# Patient Record
Sex: Female | Born: 1985 | Race: White | Hispanic: No | Marital: Married | Smoking: Former smoker
Health system: Southern US, Community
[De-identification: ages and names within clinical notes are randomized; demographics above are authoritative.]

## PROBLEM LIST (undated history)

## (undated) DIAGNOSIS — R519 Headache, unspecified: Secondary | ICD-10-CM

## (undated) DIAGNOSIS — K760 Fatty (change of) liver, not elsewhere classified: Secondary | ICD-10-CM

## (undated) DIAGNOSIS — N390 Urinary tract infection, site not specified: Secondary | ICD-10-CM

## (undated) DIAGNOSIS — Z765 Malingerer [conscious simulation]: Secondary | ICD-10-CM

## (undated) DIAGNOSIS — I10 Essential (primary) hypertension: Secondary | ICD-10-CM

## (undated) DIAGNOSIS — G43909 Migraine, unspecified, not intractable, without status migrainosus: Secondary | ICD-10-CM

## (undated) DIAGNOSIS — F32A Depression, unspecified: Secondary | ICD-10-CM

## (undated) DIAGNOSIS — G969 Disorder of central nervous system, unspecified: Secondary | ICD-10-CM

## (undated) DIAGNOSIS — Z87442 Personal history of urinary calculi: Secondary | ICD-10-CM

## (undated) DIAGNOSIS — J40 Bronchitis, not specified as acute or chronic: Secondary | ICD-10-CM

## (undated) DIAGNOSIS — F329 Major depressive disorder, single episode, unspecified: Secondary | ICD-10-CM

## (undated) DIAGNOSIS — F332 Major depressive disorder, recurrent severe without psychotic features: Secondary | ICD-10-CM

## (undated) DIAGNOSIS — J45909 Unspecified asthma, uncomplicated: Secondary | ICD-10-CM

## (undated) DIAGNOSIS — F419 Anxiety disorder, unspecified: Secondary | ICD-10-CM

## (undated) DIAGNOSIS — N200 Calculus of kidney: Secondary | ICD-10-CM

## (undated) DIAGNOSIS — F909 Attention-deficit hyperactivity disorder, unspecified type: Secondary | ICD-10-CM

## (undated) DIAGNOSIS — K589 Irritable bowel syndrome without diarrhea: Secondary | ICD-10-CM

## (undated) DIAGNOSIS — A630 Anogenital (venereal) warts: Secondary | ICD-10-CM

## (undated) DIAGNOSIS — I8290 Acute embolism and thrombosis of unspecified vein: Secondary | ICD-10-CM

## (undated) DIAGNOSIS — R51 Headache: Secondary | ICD-10-CM

## (undated) DIAGNOSIS — K8020 Calculus of gallbladder without cholecystitis without obstruction: Secondary | ICD-10-CM

## (undated) HISTORY — DX: Headache, unspecified: R51.9

## (undated) HISTORY — PX: CERVICAL BIOPSY  W/ LOOP ELECTRODE EXCISION: SUR135

## (undated) HISTORY — DX: Headache: R51

## (undated) HISTORY — PX: NO PAST SURGERIES: SHX2092

## (undated) HISTORY — DX: Urinary tract infection, site not specified: N39.0

## (undated) HISTORY — DX: Depression, unspecified: F32.A

## (undated) HISTORY — DX: Fatty (change of) liver, not elsewhere classified: K76.0

## (undated) HISTORY — DX: Irritable bowel syndrome, unspecified: K58.9

## (undated) HISTORY — DX: Major depressive disorder, single episode, unspecified: F32.9

## (undated) HISTORY — DX: Unspecified asthma, uncomplicated: J45.909

---
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
## 2011-02-18 ENCOUNTER — Emergency Department (HOSPITAL_COMMUNITY)
Admission: EM | Admit: 2011-02-18 | Discharge: 2011-02-18 | Disposition: A | Discharge status: Home / Self Care | Payer: Self-pay | Attending: Emergency Medicine | Admitting: Emergency Medicine

## 2011-02-18 DIAGNOSIS — M545 Low back pain, unspecified: Secondary | ICD-10-CM | POA: Insufficient documentation

## 2011-02-18 DIAGNOSIS — IMO0001 Reserved for inherently not codable concepts without codable children: Secondary | ICD-10-CM | POA: Insufficient documentation

## 2011-02-18 DIAGNOSIS — R300 Dysuria: Secondary | ICD-10-CM | POA: Insufficient documentation

## 2011-02-18 DIAGNOSIS — N390 Urinary tract infection, site not specified: Secondary | ICD-10-CM | POA: Insufficient documentation

## 2011-02-18 DIAGNOSIS — F341 Dysthymic disorder: Secondary | ICD-10-CM | POA: Insufficient documentation

## 2011-02-18 DIAGNOSIS — Z79899 Other long term (current) drug therapy: Secondary | ICD-10-CM | POA: Insufficient documentation

## 2011-02-18 LAB — POCT I-STAT, CHEM 8
Creatinine, Ser: 0.8 mg/dL (ref 0.50–1.10)
Glucose, Bld: 86 mg/dL (ref 70–99)
Hemoglobin: 15.3 g/dL — ABNORMAL HIGH (ref 12.0–15.0)
TCO2: 26 mmol/L (ref 0–100)

## 2011-02-18 LAB — URINALYSIS, ROUTINE W REFLEX MICROSCOPIC
Glucose, UA: NEGATIVE mg/dL
Protein, ur: NEGATIVE mg/dL
Specific Gravity, Urine: 1.016 (ref 1.005–1.030)
Urobilinogen, UA: 0.2 mg/dL (ref 0.0–1.0)

## 2011-02-18 LAB — URINE MICROSCOPIC-ADD ON

## 2011-05-08 ENCOUNTER — Emergency Department (HOSPITAL_COMMUNITY): Payer: Self-pay

## 2011-05-08 ENCOUNTER — Encounter: Payer: Self-pay | Admitting: *Deleted

## 2011-05-08 ENCOUNTER — Emergency Department (HOSPITAL_COMMUNITY)
Admission: EM | Admit: 2011-05-08 | Discharge: 2011-05-08 | Disposition: A | Discharge status: Home / Self Care | Payer: Self-pay | Attending: Emergency Medicine | Admitting: Emergency Medicine

## 2011-05-08 DIAGNOSIS — N134 Hydroureter: Secondary | ICD-10-CM

## 2011-05-08 DIAGNOSIS — R112 Nausea with vomiting, unspecified: Secondary | ICD-10-CM | POA: Insufficient documentation

## 2011-05-08 DIAGNOSIS — R109 Unspecified abdominal pain: Secondary | ICD-10-CM | POA: Insufficient documentation

## 2011-05-08 DIAGNOSIS — K7689 Other specified diseases of liver: Secondary | ICD-10-CM | POA: Insufficient documentation

## 2011-05-08 DIAGNOSIS — Z87442 Personal history of urinary calculi: Secondary | ICD-10-CM | POA: Insufficient documentation

## 2011-05-08 DIAGNOSIS — N1330 Unspecified hydronephrosis: Secondary | ICD-10-CM | POA: Insufficient documentation

## 2011-05-08 DIAGNOSIS — M545 Low back pain, unspecified: Secondary | ICD-10-CM | POA: Insufficient documentation

## 2011-05-08 HISTORY — DX: Calculus of kidney: N20.0

## 2011-05-08 HISTORY — DX: Bronchitis, not specified as acute or chronic: J40

## 2011-05-08 LAB — CBC
HCT: 43.3 % (ref 36.0–46.0)
MCH: 30 pg (ref 26.0–34.0)
MCHC: 34.9 g/dL (ref 30.0–36.0)
MCV: 85.9 fL (ref 78.0–100.0)
RDW: 12 % (ref 11.5–15.5)

## 2011-05-08 LAB — COMPREHENSIVE METABOLIC PANEL
AST: 56 U/L — ABNORMAL HIGH (ref 0–37)
CO2: 22 mEq/L (ref 19–32)
Calcium: 9.4 mg/dL (ref 8.4–10.5)
Creatinine, Ser: 0.8 mg/dL (ref 0.50–1.10)
GFR calc non Af Amer: >90 mL/min (ref >90)

## 2011-05-08 LAB — DIFFERENTIAL
Basophils Absolute: 0 10*3/uL (ref 0.0–0.1)
Basophils Relative: 0 % (ref 0–1)
Eosinophils Relative: 2 % (ref 0–5)
Lymphocytes Relative: 38 % (ref 12–46)
Monocytes Absolute: 1 10*3/uL (ref 0.1–1.0)

## 2011-05-08 LAB — URINE MICROSCOPIC-ADD ON

## 2011-05-08 LAB — URINALYSIS, ROUTINE W REFLEX MICROSCOPIC
Bilirubin Urine: NEGATIVE
Glucose, UA: NEGATIVE mg/dL
Ketones, ur: NEGATIVE mg/dL
Protein, ur: NEGATIVE mg/dL

## 2011-05-08 LAB — WET PREP, GENITAL: Yeast Wet Prep HPF POC: NONE SEEN

## 2011-05-08 MED ORDER — FENTANYL CITRATE 0.05 MG/ML IJ SOLN: 50.0000 ug | Freq: Once | INTRAMUSCULAR | Status: DC

## 2011-05-08 MED ORDER — HYDROCODONE-ACETAMINOPHEN 5-325 MG PO TABS: 1.0000 | ORAL_TABLET | Freq: Four times a day (QID) | ORAL | Status: AC | PRN

## 2011-05-08 MED ORDER — ONDANSETRON HCL 4 MG/2ML IJ SOLN
4.0000 mg | Freq: Once | INTRAMUSCULAR | Status: AC
  Administered 2011-05-08: 4 mg via INTRAVENOUS
  Filled 2011-05-08: qty 2

## 2011-05-08 MED ORDER — MORPHINE SULFATE 4 MG/ML IJ SOLN
4.0000 mg | Freq: Once | INTRAMUSCULAR | Status: AC
  Administered 2011-05-08: 4 mg via INTRAVENOUS
  Filled 2011-05-08: qty 1

## 2011-05-08 MED ORDER — NAPROXEN 500 MG PO TABS: 500.0000 mg | ORAL_TABLET | Freq: Two times a day (BID) | ORAL | Status: DC

## 2011-05-08 MED ORDER — KETOROLAC TROMETHAMINE 30 MG/ML IJ SOLN
30.0000 mg | Freq: Once | INTRAMUSCULAR | Status: AC
  Administered 2011-05-08: 30 mg via INTRAVENOUS
  Filled 2011-05-08: qty 1

## 2011-05-08 NOTE — ED Notes (Signed)
Per EMS, pt from home, reports pain which started in her L groin area that radiates up to her LLQ and around to her L flank area.  Also reports nausea and dry heaving.  Pt has hx of kidney stones.  Pt is taking prednisone for bronchitis.

## 2011-05-08 NOTE — ED Notes (Signed)
Patient transported to CT 

## 2011-05-08 NOTE — ED Notes (Signed)
Pt ambulatory to restroom with minimal assistance   

## 2011-05-08 NOTE — ED Provider Notes (Signed)
History     CSN: [id number]  Arrival date & time 05/08/11  1432   First MD Initiated Contact with Patient 05/08/11 1448     6:37 PM HPI Carol [name] is a 26 y.o. female complaining of left flank pain. States initially pain was sharp vaginal pain that shot up the left lower abdomen. Reports pain in now mainly in her left lower back and flank. Denies vaginal bleeding, discharge, fever, or midline back pain. Denies known injury of h/o abdominal surgeries. Patient is a 26 y.o. female presenting with flank pain. The history is provided by the patient.  Flank Pain This is a new problem. The problem occurs constantly. The problem has been gradually worsening. Associated symptoms include abdominal pain, nausea, urinary symptoms and vomiting. Pertinent negatives include no chest pain, chills, fever, numbness or weakness. The symptoms are aggravated by nothing.    Past Medical History  Diagnosis Date  . Kidney stones   . Bronchitis     History reviewed. No pertinent past surgical history.  No family history on file.  History  Substance Use Topics  . Smoking status: Not on file  . Smokeless tobacco: Not on file  . Alcohol Use:     OB History    Grav Para Term Preterm Abortions TAB SAB Ect Mult Living                  Review of Systems  Constitutional: Negative for fever and chills.  Respiratory: Negative for shortness of breath.   Cardiovascular: Negative for chest pain.  Gastrointestinal: Positive for nausea, vomiting and abdominal pain. Negative for diarrhea and constipation.  Genitourinary: Positive for flank pain and vaginal pain. Negative for dysuria, urgency, frequency, hematuria, vaginal bleeding and vaginal discharge.  Musculoskeletal: Negative for back pain.  Neurological: Negative for weakness and numbness.  All other systems reviewed and are negative.    Allergies  Sulfa antibiotics; Ciprocin-fluocin-procin; and Penicillins  Home Medications   Current  Outpatient Rx  Name Route Sig Dispense Refill  . ALBUTEROL SULFATE HFA 108 (90 BASE) MCG/ACT IN AERS Inhalation Inhale 2 puffs into the lungs every 6 (six) hours as needed. For shortness of breath     . ALPRAZOLAM 1 MG PO TABS Oral Take 1 mg by mouth 3 (three) times daily as needed. For anxiety      . PSEUDOEPHEDRINE-GUAIFENESIN ER 60-600 MG PO TB12 Oral Take 2 tablets by mouth every 12 (twelve) hours.        BP 121/80  Pulse 69  Temp(Src) 97.8 F (36.6 C) (Oral)  Resp 16  SpO2 99%  Physical Exam  Vitals reviewed. Constitutional: She is oriented to person, place, and time. Vital signs are normal. She appears well-developed and well-nourished.  HENT:  Head: Normocephalic and atraumatic.  Eyes: Conjunctivae are normal. Pupils are equal, round, and reactive to light.  Neck: Normal range of motion. Neck supple.  Cardiovascular: Normal rate, regular rhythm and normal heart sounds.  Exam reveals no friction rub.   No murmur heard. Pulmonary/Chest: Effort normal and breath sounds normal. She has no wheezes. She has no rhonchi. She has no rales. She exhibits no tenderness.  Abdominal: Soft. Normal appearance and bowel sounds are normal. There is hepatomegaly. There is no hepatosplenomegaly. There is tenderness in the suprapubic area and left lower quadrant. There is CVA tenderness (left sided). There is no rebound, no tenderness at McBurney's point and negative Murphy's sign.  Musculoskeletal: Normal range of motion.  Neurological: She is alert  and oriented to person, place, and time. Coordination normal.  Skin: Skin is warm and dry. No rash noted. No erythema. No pallor.    ED Course  Procedures (including critical care time)  Labs Reviewed  URINALYSIS, ROUTINE W REFLEX MICROSCOPIC - Abnormal; Notable for the following:    APPearance CLOUDY (*)    Hgb urine dipstick SMALL (*)    Leukocytes, UA MODERATE (*)    All other components within normal limits  CBC - Abnormal; Notable for the  following:    Hemoglobin 15.1 (*)    All other components within normal limits  COMPREHENSIVE METABOLIC PANEL - Abnormal; Notable for the following:    Glucose, Bld 106 (*)    AST 56 (*) HEMOLYSIS AT THIS LEVEL MAY AFFECT RESULT   ALT 76 (*)    All other components within normal limits  URINE MICROSCOPIC-ADD ON - Abnormal; Notable for the following:    Squamous Epithelial / LPF MANY (*)    Bacteria, UA FEW (*)    All other components within normal limits  WET PREP, GENITAL - Abnormal; Notable for the following:    Clue Cells, Wet Prep FEW (*)    WBC, Wet Prep HPF POC MANY (*)    All other components within normal limits  DIFFERENTIAL  LIPASE, BLOOD  POCT PREGNANCY, URINE  GC/CHLAMYDIA PROBE AMP, GENITAL   Ct Abdomen Pelvis Wo Contrast  05/08/2011  *RADIOLOGY REPORT*  Clinical Data: Left flank pain.  CT ABDOMEN AND PELVIS WITHOUT CONTRAST  Technique:  Multidetector CT imaging of the abdomen and pelvis was performed following the standard protocol without intravenous contrast.  Comparison: None  Findings: Lung bases are clear.  No effusions.  Heart is normal size.  There is diffuse fatty infiltration of the liver.  No focal abnormality in the liver.  Gallbladder, spleen, pancreas, adrenals and right kidney have an unremarkable unenhanced appearance.  There is mild left hydronephrosis. No renal or ureteral stones.  No stones within the bladder.  Question recent passage of stone.  Uterus and adnexa are unremarkable. Bowel grossly unremarkable.  No free fluid, free air, or adenopathy.  Appendix is visualized and is normal.  Aorta is normal caliber.  No acute bony abnormality.  IMPRESSION: Mild left hydronephrosis and hydroureter without visible stone. Question recent passage of stone.  Fatty infiltration of the liver.  Original Report Authenticated By: Cyndie Chime, M.D.     No diagnosis found.    MDM   Patient passed Stone in Ed and pain significantly improved.Will d/c home with  analgesics . Pt agrees with plan and is ready for d/c     Thomasene Lot, PA-C 05/08/11 2302

## 2011-05-08 NOTE — ED Notes (Signed)
Brigette, PA at bedside.

## 2011-05-09 LAB — GC/CHLAMYDIA PROBE AMP, GENITAL
Chlamydia, DNA Probe: NEGATIVE
GC Probe Amp, Genital: NEGATIVE

## 2011-05-09 NOTE — ED Provider Notes (Signed)
Medical screening examination/treatment/procedure(s) were performed by non-physician practitioner and as supervising physician I was immediately available for consultation/collaboration.  Ameenah Prosser, MD 05/09/11 1241 

## 2011-10-23 ENCOUNTER — Emergency Department (HOSPITAL_BASED_OUTPATIENT_CLINIC_OR_DEPARTMENT_OTHER)
Admission: EM | Admit: 2011-10-23 | Discharge: 2011-10-23 | Disposition: A | Discharge status: Home / Self Care | Payer: PRIVATE HEALTH INSURANCE | Attending: Emergency Medicine | Admitting: Emergency Medicine

## 2011-10-23 ENCOUNTER — Encounter (HOSPITAL_BASED_OUTPATIENT_CLINIC_OR_DEPARTMENT_OTHER): Payer: Self-pay | Admitting: *Deleted

## 2011-10-23 ENCOUNTER — Emergency Department (HOSPITAL_BASED_OUTPATIENT_CLINIC_OR_DEPARTMENT_OTHER): Payer: PRIVATE HEALTH INSURANCE

## 2011-10-23 DIAGNOSIS — F411 Generalized anxiety disorder: Secondary | ICD-10-CM | POA: Insufficient documentation

## 2011-10-23 DIAGNOSIS — R404 Transient alteration of awareness: Secondary | ICD-10-CM | POA: Insufficient documentation

## 2011-10-23 DIAGNOSIS — T148XXA Other injury of unspecified body region, initial encounter: Secondary | ICD-10-CM

## 2011-10-23 DIAGNOSIS — S8263XA Displaced fracture of lateral malleolus of unspecified fibula, initial encounter for closed fracture: Secondary | ICD-10-CM | POA: Insufficient documentation

## 2011-10-23 DIAGNOSIS — Z79899 Other long term (current) drug therapy: Secondary | ICD-10-CM | POA: Insufficient documentation

## 2011-10-23 DIAGNOSIS — M25579 Pain in unspecified ankle and joints of unspecified foot: Secondary | ICD-10-CM | POA: Insufficient documentation

## 2011-10-23 DIAGNOSIS — M545 Low back pain, unspecified: Secondary | ICD-10-CM | POA: Insufficient documentation

## 2011-10-23 DIAGNOSIS — Z87442 Personal history of urinary calculi: Secondary | ICD-10-CM | POA: Insufficient documentation

## 2011-10-23 DIAGNOSIS — I10 Essential (primary) hypertension: Secondary | ICD-10-CM | POA: Insufficient documentation

## 2011-10-23 DIAGNOSIS — M25519 Pain in unspecified shoulder: Secondary | ICD-10-CM | POA: Insufficient documentation

## 2011-10-23 DIAGNOSIS — M79609 Pain in unspecified limb: Secondary | ICD-10-CM | POA: Insufficient documentation

## 2011-10-23 DIAGNOSIS — S82899A Other fracture of unspecified lower leg, initial encounter for closed fracture: Secondary | ICD-10-CM

## 2011-10-23 HISTORY — DX: Anxiety disorder, unspecified: F41.9

## 2011-10-23 HISTORY — DX: Essential (primary) hypertension: I10

## 2011-10-23 MED ORDER — OXYCODONE-ACETAMINOPHEN 5-325 MG PO TABS
1.0000 | ORAL_TABLET | Freq: Once | ORAL | Status: AC
  Administered 2011-10-23: 1 via ORAL
  Filled 2011-10-23: qty 1

## 2011-10-23 MED ORDER — OXYCODONE-ACETAMINOPHEN 5-325 MG PO TABS: 1.0000 | ORAL_TABLET | Freq: Four times a day (QID) | ORAL | Status: AC | PRN

## 2011-10-23 MED ORDER — CYCLOBENZAPRINE HCL 10 MG PO TABS: 10.0000 mg | ORAL_TABLET | Freq: Two times a day (BID) | ORAL | Status: AC | PRN

## 2011-10-23 NOTE — Discharge Instructions (Signed)
Contusion A contusion is a deep bruise. Contusions are the result of an injury that caused bleeding under the skin. The contusion may turn blue, purple, or yellow. Minor injuries will give you a painless contusion, but more severe contusions may stay painful and swollen for a few weeks.  CAUSES  A contusion is usually caused by a blow, trauma, or direct force to an area of the body. SYMPTOMS   Swelling and redness of the injured area.   Bruising of the injured area.   Tenderness and soreness of the injured area.   Pain.  DIAGNOSIS  The diagnosis can be made by taking a history and physical exam. An X-ray, CT scan, or MRI may be needed to determine if there were any associated injuries, such as fractures. TREATMENT  Specific treatment will depend on what area of the body was injured. In general, the best treatment for a contusion is resting, icing, elevating, and applying cold compresses to the injured area. Over-the-counter medicines may also be recommended for pain control. Ask your caregiver what the best treatment is for your contusion. HOME CARE INSTRUCTIONS   Put ice on the injured area.   Put ice in a plastic bag.   Place a towel between your skin and the bag.   Leave the ice on for 15 to 20 minutes, 3 to 4 times a day.   Only take over-the-counter or prescription medicines for pain, discomfort, or fever as directed by your caregiver. Your caregiver may recommend avoiding anti-inflammatory medicines (aspirin, ibuprofen, and naproxen) for 48 hours because these medicines may increase bruising.   Rest the injured area.   If possible, elevate the injured area to reduce swelling.  SEEK IMMEDIATE MEDICAL CARE IF:   You have increased bruising or swelling.   You have pain that is getting worse.   Your swelling or pain is not relieved with medicines.  MAKE SURE YOU:   Understand these instructions.   Will watch your condition.   Will get help right away if you are not  doing well or get worse.  Document Released: 01/24/2005 Document Revised: 04/05/2011 Document Reviewed: 02/19/2011 [facility] Patient Information 2012 [place], [place].Ankle Fracture A fracture is a break in the bone. Most of the time, surgery is not needed for a broken ankle. A cast, splint, ankle brace, or walking boot may be used to heal the break. A broken ankle can heal in 6 to 12 weeks. HOME CARE  Do not walk on your broken ankle until told by your doctor.   Use crutches as told by your doctor.   Keep your ankle raised (elevated) to the level of the chest as much as possible.   Take medicine as told by your doctor.   Do not smoke.   Eat healthy foods.   Get follow-up care as told by your doctor.   If you do not need a cast, splint, brace, or boot:   Apply an ice pack to the ankle as told by your doctor.   If you have an ankle brace or walking boot:   Only remove it as told by your doctor.   Apply an ice pack to the ankle as told by your doctor.   If you have a splint or cast:   Rest your plaster splint or cast only on a pillow until it is fully hardened. It may take 3 days for the plaster to harden.   Do not scratch under the splint or cast.   Do not  stick anything inside the splint or cast to scratch your skin.   Keep sand and dirt out of the inside of the splint or cast.   Do not pull out the padding from the splint or cast.   Keep your splint or cast dry and clean. Cover it with a plastic bag during showers.   Check the skin around the splint or cast every day. You may put lotion on sore areas.   Do not put pressure on any part of your cast or splint. It may break.  GET HELP RIGHT AWAY IF:   You have severe pain, burning or stinging.   Your toes turn blue or gray.   Your toes feel cold or numb.   You cannot move your toes.   Your splint or cast is too tight.   Your splint or cast breaks.   There is a bad smell or yellowish white fluid (pus) coming  from under the splint or cast.  MAKE SURE YOU:   Understand these instructions.   Will watch your condition.   Will get help right away if you are not doing well or get worse.  Document Released: 02/11/2009 Document Revised: 04/05/2011 Document Reviewed: 02/16/2009 [facility] Patient Information 2012 [place], [place].Ankle Fracture A fracture is a break in the bone. Most of the time, surgery is not needed for a broken ankle. A cast, splint, ankle brace, or walking boot may be used to heal the break. A broken ankle can heal in 6 to 12 weeks. HOME CARE  Do not walk on your broken ankle until told by your doctor.   Use crutches as told by your doctor.   Keep your ankle raised (elevated) to the level of the chest as much as possible.   Take medicine as told by your doctor.   Do not smoke.   Eat healthy foods.   Get follow-up care as told by your doctor.   If you do not need a cast, splint, brace, or boot:   Apply an ice pack to the ankle as told by your doctor.   If you have an ankle brace or walking boot:   Only remove it as told by your doctor.   Apply an ice pack to the ankle as told by your doctor.   If you have a splint or cast:   Rest your plaster splint or cast only on a pillow until it is fully hardened. It may take 3 days for the plaster to harden.   Do not scratch under the splint or cast.   Do not stick anything inside the splint or cast to scratch your skin.   Keep sand and dirt out of the inside of the splint or cast.   Do not pull out the padding from the splint or cast.   Keep your splint or cast dry and clean. Cover it with a plastic bag during showers.   Check the skin around the splint or cast every day. You may put lotion on sore areas.   Do not put pressure on any part of your cast or splint. It may break.  GET HELP RIGHT AWAY IF:   You have severe pain, burning or stinging.   Your toes turn blue or gray.   Your toes feel cold or numb.   You  cannot move your toes.   Your splint or cast is too tight.   Your splint or cast breaks.   There is a bad smell or yellowish white  fluid (pus) coming from under the splint or cast.  MAKE SURE YOU:   Understand these instructions.   Will watch your condition.   Will get help right away if you are not doing well or get worse.  Document Released: 02/11/2009 Document Revised: 04/05/2011 Document Reviewed: 02/16/2009 [facility] Patient Information 2012 [place], [place].
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
## 2011-12-06 ENCOUNTER — Ambulatory Visit (HOSPITAL_COMMUNITY): Payer: PRIVATE HEALTH INSURANCE | Admitting: Psychology

## 2012-05-08 ENCOUNTER — Ambulatory Visit (INDEPENDENT_AMBULATORY_CARE_PROVIDER_SITE_OTHER): Payer: PRIVATE HEALTH INSURANCE | Admitting: Psychiatry

## 2012-05-08 ENCOUNTER — Encounter (HOSPITAL_COMMUNITY): Payer: Self-pay | Admitting: Psychiatry

## 2012-05-08 VITALS — Wt 257.2 lb

## 2012-05-08 DIAGNOSIS — F5105 Insomnia due to other mental disorder: Secondary | ICD-10-CM

## 2012-05-08 DIAGNOSIS — F909 Attention-deficit hyperactivity disorder, unspecified type: Secondary | ICD-10-CM

## 2012-05-08 DIAGNOSIS — F418 Other specified anxiety disorders: Secondary | ICD-10-CM | POA: Insufficient documentation

## 2012-05-08 DIAGNOSIS — F603 Borderline personality disorder: Secondary | ICD-10-CM | POA: Insufficient documentation

## 2012-05-08 MED ORDER — ALPRAZOLAM 1 MG PO TABS: 1.0000 mg | ORAL_TABLET | Freq: Three times a day (TID) | ORAL | Status: DC | PRN

## 2012-05-08 MED ORDER — MIRTAZAPINE 15 MG PO TABS: 15.0000 mg | ORAL_TABLET | Freq: Every day | ORAL | Status: DC

## 2012-05-08 NOTE — Patient Instructions (Addendum)
Look up low dose Risperdal on the Internet  DBT at [facility].  Outpatient group $25 per session and 12 weekly session.  CUT BACK/CUT OUT on sugar and carbohydrates, that means very limited fruits and starchy vegetables and very limited grains, breads  The goal is low GLYCEMIC INDEX.  CUT OUT all wheat, rye, or barley for the gluten in them.  HIGH fat and LOW carbohydrate diet is the KEY.  Eat avocados, eggs, lean meat like grass fed beef and chicken  Nuts and seeds would be good foods as well.   Stevia is an excellent sweetener.  Safe for the brain.   Almond butter is awesome.  Tilden Fossa is an excellent resource for weight reduction.  She is a Publishing rights manager who has practiced for 15 years in the field of weight management.  Her phone number is [phone].
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
## 2012-06-03 ENCOUNTER — Ambulatory Visit (HOSPITAL_COMMUNITY): Payer: PRIVATE HEALTH INSURANCE | Admitting: Psychiatry

## 2012-06-06 ENCOUNTER — Ambulatory Visit (HOSPITAL_COMMUNITY): Payer: PRIVATE HEALTH INSURANCE | Admitting: Psychiatry

## 2012-06-13 ENCOUNTER — Ambulatory Visit (HOSPITAL_COMMUNITY): Payer: PRIVATE HEALTH INSURANCE | Admitting: Psychiatry

## 2012-07-30 ENCOUNTER — Ambulatory Visit (INDEPENDENT_AMBULATORY_CARE_PROVIDER_SITE_OTHER): Payer: Self-pay | Admitting: Psychiatry

## 2012-07-30 ENCOUNTER — Ambulatory Visit (HOSPITAL_COMMUNITY): Payer: Self-pay | Admitting: Psychiatry

## 2012-07-30 ENCOUNTER — Encounter (HOSPITAL_COMMUNITY): Payer: Self-pay | Admitting: Psychiatry

## 2012-07-30 VITALS — Wt 261.0 lb

## 2012-07-30 DIAGNOSIS — F603 Borderline personality disorder: Secondary | ICD-10-CM

## 2012-07-30 DIAGNOSIS — F909 Attention-deficit hyperactivity disorder, unspecified type: Secondary | ICD-10-CM

## 2012-07-30 DIAGNOSIS — G969 Disorder of central nervous system, unspecified: Secondary | ICD-10-CM | POA: Insufficient documentation

## 2012-07-30 DIAGNOSIS — F418 Other specified anxiety disorders: Secondary | ICD-10-CM

## 2012-07-30 MED ORDER — MIRTAZAPINE 15 MG PO TABS: 15.0000 mg | ORAL_TABLET | Freq: Every day | ORAL | Status: DC

## 2012-07-30 MED ORDER — CARBAMAZEPINE ER 200 MG PO TB12: ORAL_TABLET | ORAL | Status: DC

## 2012-07-30 MED ORDER — ALPRAZOLAM 1 MG PO TABS: 1.0000 mg | ORAL_TABLET | Freq: Three times a day (TID) | ORAL | Status: DC | PRN
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
## 2012-07-30 NOTE — Patient Instructions (Addendum)
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  Look these up on the Internet.  For what is believed to be chronic tramatic encephalopathy, it advised that you get  regular exercise, regular sleep, and  consume good quality, fish oil, 1000 mg twice a day. These 3 things are the foundation of rehabilitating your brain. Staying off all abusable substances including nicotine, caffeine, and refined sugar and avoiding further head injuries are the other important elements in helping you keep your brain working the best it can for you. If memory is a problem then INSTEAD of the fish oil mentioned above, try using Brain Power Basics from [employer].  You can order online or by phone [phone]. It costs $99 for the first month, and $80 monthly thereafter, but that investment in your brain and the recovery of your brain proper functioning would seem worth it.  Relaxation is the ultimate solution for you.  You can seek it through tub baths, bubble baths, essential oils or incense, walking or chatting with friends, listening to soft music, watching a candle burn and just letting all thoughts go and appreciating the true essence of the Creator.  Pets or animals may be very helpful.  You might spend some time with them and then go do more directed meditation.  Yoga is a very helpful exercise method.  On TV, on line, or by DVD Adelfa Koh is a source of high quality information about yoga and videos on yoga.  Renee Ramus is the world's number one video yoga instructor according to some experts.  There are exceptional health benefits that can be achieved through yoga.  The main principles of yoga is acceptance, no competition, no comparison, and no judgement.  It is exceptional in helping people meditate and get to a very relaxed state.   CUT BACK/CUT OUT on sugar and carbohydrates, that means very limited fruits and starchy vegetables and very limited grains,  breads  The goal is low GLYCEMIC INDEX.  CUT OUT all wheat, rye, or barley for the GLUTEN in them.  HIGH fat and LOW carbohydrate diet is the KEY.  Eat avocados, eggs, lean meat like grass fed beef and chicken  Nuts and seeds would be good foods as well.   Stevia is an excellent sweetener.  Safe for the brain.   Lowella Grip is also a good safe sweetener, not the baking blend form of Truvia  Almond butter is awesome.  Check out all this on the Internet.  Dr [place]ber [place] is on the Internet with some good info about this.   http://www.drperlmutter.com is where that is.  An excellent site for info on this diet is http://paleoleap.com  [employer] makes dark chocolate that is sweetened with Stevia that is safe.  Call if problems or concerns.

## 2013-01-26 ENCOUNTER — Ambulatory Visit (HOSPITAL_COMMUNITY): Payer: Self-pay | Admitting: Psychiatry

## 2013-01-30 ENCOUNTER — Ambulatory Visit (INDEPENDENT_AMBULATORY_CARE_PROVIDER_SITE_OTHER): Payer: PRIVATE HEALTH INSURANCE | Admitting: Psychiatry

## 2013-01-30 ENCOUNTER — Ambulatory Visit (HOSPITAL_COMMUNITY): Payer: Self-pay | Admitting: Psychiatry

## 2013-01-30 ENCOUNTER — Encounter (HOSPITAL_COMMUNITY): Payer: Self-pay | Admitting: Psychiatry

## 2013-01-30 VITALS — BP 130/94 | Ht 66.0 in | Wt 262.0 lb

## 2013-01-30 DIAGNOSIS — F411 Generalized anxiety disorder: Secondary | ICD-10-CM

## 2013-01-30 DIAGNOSIS — F909 Attention-deficit hyperactivity disorder, unspecified type: Secondary | ICD-10-CM

## 2013-01-30 DIAGNOSIS — F418 Other specified anxiety disorders: Secondary | ICD-10-CM

## 2013-01-30 MED ORDER — ALPRAZOLAM 1 MG PO TABS: 1.0000 mg | ORAL_TABLET | Freq: Three times a day (TID) | ORAL | Status: DC | PRN
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
## 2013-03-26 ENCOUNTER — Encounter (HOSPITAL_BASED_OUTPATIENT_CLINIC_OR_DEPARTMENT_OTHER): Payer: Self-pay | Admitting: Emergency Medicine

## 2013-03-26 ENCOUNTER — Emergency Department (HOSPITAL_BASED_OUTPATIENT_CLINIC_OR_DEPARTMENT_OTHER)
Admission: EM | Admit: 2013-03-26 | Discharge: 2013-03-26 | Disposition: A | Discharge status: Home / Self Care | Payer: PRIVATE HEALTH INSURANCE | Attending: Emergency Medicine | Admitting: Emergency Medicine

## 2013-03-26 DIAGNOSIS — IMO0002 Reserved for concepts with insufficient information to code with codable children: Secondary | ICD-10-CM | POA: Insufficient documentation

## 2013-03-26 DIAGNOSIS — J45909 Unspecified asthma, uncomplicated: Secondary | ICD-10-CM | POA: Insufficient documentation

## 2013-03-26 DIAGNOSIS — F3289 Other specified depressive episodes: Secondary | ICD-10-CM | POA: Insufficient documentation

## 2013-03-26 DIAGNOSIS — Z8709 Personal history of other diseases of the respiratory system: Secondary | ICD-10-CM | POA: Insufficient documentation

## 2013-03-26 DIAGNOSIS — Z87442 Personal history of urinary calculi: Secondary | ICD-10-CM | POA: Insufficient documentation

## 2013-03-26 DIAGNOSIS — F329 Major depressive disorder, single episode, unspecified: Secondary | ICD-10-CM | POA: Insufficient documentation

## 2013-03-26 DIAGNOSIS — Z8719 Personal history of other diseases of the digestive system: Secondary | ICD-10-CM | POA: Insufficient documentation

## 2013-03-26 DIAGNOSIS — F17200 Nicotine dependence, unspecified, uncomplicated: Secondary | ICD-10-CM | POA: Insufficient documentation

## 2013-03-26 DIAGNOSIS — Z880 Allergy status to penicillin: Secondary | ICD-10-CM | POA: Insufficient documentation

## 2013-03-26 DIAGNOSIS — G43909 Migraine, unspecified, not intractable, without status migrainosus: Secondary | ICD-10-CM | POA: Insufficient documentation

## 2013-03-26 DIAGNOSIS — F411 Generalized anxiety disorder: Secondary | ICD-10-CM | POA: Insufficient documentation

## 2013-03-26 DIAGNOSIS — Z87440 Personal history of urinary (tract) infections: Secondary | ICD-10-CM | POA: Insufficient documentation

## 2013-03-26 HISTORY — DX: Migraine, unspecified, not intractable, without status migrainosus: G43.909

## 2013-03-26 MED ORDER — METOCLOPRAMIDE HCL 5 MG/ML IJ SOLN
10.0000 mg | Freq: Once | INTRAMUSCULAR | Status: AC
  Administered 2013-03-26: 10 mg via INTRAVENOUS
  Filled 2013-03-26: qty 2

## 2013-03-26 MED ORDER — HYDROCODONE-ACETAMINOPHEN 5-325 MG PO TABS: 2.0000 | ORAL_TABLET | ORAL | Status: DC | PRN

## 2013-03-26 MED ORDER — SODIUM CHLORIDE 0.9 % IV BOLUS (SEPSIS)
1000.0000 mL | Freq: Once | INTRAVENOUS | Status: AC
  Administered 2013-03-26: 1000 mL via INTRAVENOUS

## 2013-03-26 MED ORDER — KETOROLAC TROMETHAMINE 30 MG/ML IJ SOLN
30.0000 mg | Freq: Once | INTRAMUSCULAR | Status: AC
  Administered 2013-03-26: 30 mg via INTRAVENOUS
  Filled 2013-03-26: qty 1

## 2013-03-26 MED ORDER — DIPHENHYDRAMINE HCL 50 MG/ML IJ SOLN
25.0000 mg | Freq: Once | INTRAMUSCULAR | Status: AC
  Administered 2013-03-26: 25 mg via INTRAVENOUS
  Filled 2013-03-26: qty 1

## 2013-03-26 NOTE — ED Notes (Signed)
C/o migraine headache, states has history of same, c/o photophobia, states HA started around 10 am, c/o nausea when started. States when HA gets bad, feels off, almost confused

## 2013-03-26 NOTE — ED Notes (Signed)
MD at bedside. 
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
## 2013-04-01 ENCOUNTER — Ambulatory Visit (HOSPITAL_COMMUNITY): Payer: Self-pay | Admitting: Psychiatry

## 2013-04-13 ENCOUNTER — Ambulatory Visit (INDEPENDENT_AMBULATORY_CARE_PROVIDER_SITE_OTHER): Payer: PRIVATE HEALTH INSURANCE | Admitting: Psychiatry

## 2013-04-13 ENCOUNTER — Encounter (HOSPITAL_COMMUNITY): Payer: Self-pay | Admitting: Psychiatry

## 2013-04-13 VITALS — BP 120/80 | Ht 66.0 in | Wt 248.0 lb

## 2013-04-13 DIAGNOSIS — F411 Generalized anxiety disorder: Secondary | ICD-10-CM

## 2013-04-13 DIAGNOSIS — F418 Other specified anxiety disorders: Secondary | ICD-10-CM

## 2013-04-13 DIAGNOSIS — F909 Attention-deficit hyperactivity disorder, unspecified type: Secondary | ICD-10-CM

## 2013-04-13 MED ORDER — ALPRAZOLAM 1 MG PO TABS: 1.0000 mg | ORAL_TABLET | Freq: Three times a day (TID) | ORAL | Status: DC | PRN

## 2013-04-13 MED ORDER — BUPROPION HCL ER (XL) 150 MG PO TB24: 150.0000 mg | ORAL_TABLET | ORAL | Status: DC
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
## 2013-04-15 ENCOUNTER — Encounter (HOSPITAL_BASED_OUTPATIENT_CLINIC_OR_DEPARTMENT_OTHER): Payer: Self-pay | Admitting: Emergency Medicine

## 2013-04-15 ENCOUNTER — Emergency Department (HOSPITAL_BASED_OUTPATIENT_CLINIC_OR_DEPARTMENT_OTHER)
Admission: EM | Admit: 2013-04-15 | Discharge: 2013-04-15 | Disposition: A | Discharge status: Home / Self Care | Payer: PRIVATE HEALTH INSURANCE | Attending: Emergency Medicine | Admitting: Emergency Medicine

## 2013-04-15 DIAGNOSIS — N390 Urinary tract infection, site not specified: Secondary | ICD-10-CM

## 2013-04-15 DIAGNOSIS — T43502A Poisoning by unspecified antipsychotics and neuroleptics, intentional self-harm, initial encounter: Secondary | ICD-10-CM | POA: Insufficient documentation

## 2013-04-15 DIAGNOSIS — T424X4A Poisoning by benzodiazepines, undetermined, initial encounter: Secondary | ICD-10-CM | POA: Insufficient documentation

## 2013-04-15 DIAGNOSIS — Z3202 Encounter for pregnancy test, result negative: Secondary | ICD-10-CM | POA: Insufficient documentation

## 2013-04-15 DIAGNOSIS — I10 Essential (primary) hypertension: Secondary | ICD-10-CM | POA: Insufficient documentation

## 2013-04-15 DIAGNOSIS — F17200 Nicotine dependence, unspecified, uncomplicated: Secondary | ICD-10-CM | POA: Insufficient documentation

## 2013-04-15 DIAGNOSIS — F411 Generalized anxiety disorder: Secondary | ICD-10-CM | POA: Insufficient documentation

## 2013-04-15 DIAGNOSIS — G43909 Migraine, unspecified, not intractable, without status migrainosus: Secondary | ICD-10-CM | POA: Insufficient documentation

## 2013-04-15 DIAGNOSIS — IMO0002 Reserved for concepts with insufficient information to code with codable children: Secondary | ICD-10-CM | POA: Insufficient documentation

## 2013-04-15 DIAGNOSIS — Z8719 Personal history of other diseases of the digestive system: Secondary | ICD-10-CM | POA: Insufficient documentation

## 2013-04-15 DIAGNOSIS — R45851 Suicidal ideations: Secondary | ICD-10-CM | POA: Insufficient documentation

## 2013-04-15 DIAGNOSIS — F3289 Other specified depressive episodes: Secondary | ICD-10-CM | POA: Insufficient documentation

## 2013-04-15 DIAGNOSIS — J45909 Unspecified asthma, uncomplicated: Secondary | ICD-10-CM | POA: Insufficient documentation

## 2013-04-15 DIAGNOSIS — Z87442 Personal history of urinary calculi: Secondary | ICD-10-CM | POA: Insufficient documentation

## 2013-04-15 DIAGNOSIS — F329 Major depressive disorder, single episode, unspecified: Secondary | ICD-10-CM | POA: Insufficient documentation

## 2013-04-15 DIAGNOSIS — Z79899 Other long term (current) drug therapy: Secondary | ICD-10-CM | POA: Insufficient documentation

## 2013-04-15 DIAGNOSIS — Z880 Allergy status to penicillin: Secondary | ICD-10-CM | POA: Insufficient documentation

## 2013-04-15 DIAGNOSIS — T50902A Poisoning by unspecified drugs, medicaments and biological substances, intentional self-harm, initial encounter: Secondary | ICD-10-CM

## 2013-04-15 LAB — URINALYSIS, ROUTINE W REFLEX MICROSCOPIC
Ketones, ur: 40 mg/dL — AB
Protein, ur: 30 mg/dL — AB
Urobilinogen, UA: 1 mg/dL (ref 0.0–1.0)

## 2013-04-15 LAB — COMPREHENSIVE METABOLIC PANEL
ALT: 71 U/L — ABNORMAL HIGH (ref 0–35)
AST: 41 U/L — ABNORMAL HIGH (ref 0–37)
Alkaline Phosphatase: 64 U/L (ref 39–117)
CO2: 24 mEq/L (ref 19–32)
Calcium: 9.6 mg/dL (ref 8.4–10.5)
GFR calc Af Amer: >90 mL/min (ref >90)
GFR calc non Af Amer: >90 mL/min (ref >90)
Glucose, Bld: 105 mg/dL — ABNORMAL HIGH (ref 70–99)
Potassium: 3.5 mEq/L (ref 3.5–5.1)
Sodium: 141 mEq/L (ref 135–145)

## 2013-04-15 LAB — RAPID URINE DRUG SCREEN, HOSP PERFORMED
Amphetamines: NOT DETECTED
Barbiturates: NOT DETECTED
Cocaine: NOT DETECTED
Opiates: POSITIVE — AB
Tetrahydrocannabinol: NOT DETECTED

## 2013-04-15 LAB — CBC WITH DIFFERENTIAL/PLATELET
Band Neutrophils: 2 % (ref 0–10)
Blasts: 0 %
HCT: 45.5 % (ref 36.0–46.0)
MCH: 30 pg (ref 26.0–34.0)
MCHC: 34.1 g/dL (ref 30.0–36.0)
MCV: 88 fL (ref 78.0–100.0)
Metamyelocytes Relative: 0 %
Monocytes Relative: 12 % (ref 3–12)
Myelocytes: 2 %
Platelets: 339 10*3/uL (ref 150–400)
Promyelocytes Absolute: 0 %
RDW: 12.3 % (ref 11.5–15.5)
WBC: 9.3 10*3/uL (ref 4.0–10.5)
nRBC: 0 /100 WBC

## 2013-04-15 LAB — URINE MICROSCOPIC-ADD ON

## 2013-04-15 MED ORDER — NICOTINE 21 MG/24HR TD PT24
21.0000 mg | MEDICATED_PATCH | Freq: Once | TRANSDERMAL | Status: DC
  Administered 2013-04-15: 21 mg via TRANSDERMAL
  Filled 2013-04-15: qty 1

## 2013-04-15 MED ORDER — NITROFURANTOIN MONOHYD MACRO 100 MG PO CAPS
100.0000 mg | ORAL_CAPSULE | Freq: Two times a day (BID) | ORAL | Status: DC
  Filled 2013-04-15: qty 1

## 2013-04-15 MED ORDER — ACETAMINOPHEN 325 MG PO TABS
650.0000 mg | ORAL_TABLET | Freq: Once | ORAL | Status: AC
  Administered 2013-04-15: 650 mg via ORAL
  Filled 2013-04-15: qty 2

## 2013-04-15 MED ORDER — ALPRAZOLAM 0.5 MG PO TABS
1.0000 mg | ORAL_TABLET | Freq: Once | ORAL | Status: AC
  Administered 2013-04-15: 1 mg via ORAL
  Filled 2013-04-15: qty 2

## 2013-04-15 NOTE — ED Notes (Signed)
TTS appt time 1630

## 2013-04-15 NOTE — ED Notes (Signed)
Staffing called and sitter will be coming to this facility after 3pm

## 2013-04-15 NOTE — ED Notes (Signed)
Medication verified and counted in front of patient.  Medication given to Netta Neat Tech to be locked up.
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
## 2013-04-15 NOTE — BH Assessment (Signed)
TTS consult scheduled for 1630. Writer asked that TTS machine is placed in patients room by this time. Writer contacted EDP-Dr. Judd Lien for clinical information prior to this patient's TTS evaluation. Writer shared this information with Najah-TTS staff who will complete this patient's assessment.
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
## 2013-04-15 NOTE — ED Notes (Signed)
MD at bedside. 

## 2013-04-15 NOTE — ED Provider Notes (Signed)
Care assumed from Dr. Lynelle Doctor at shift change. Patient is awaiting consult from TTS. This was completed and it is their opinion that she is appropriate for discharge with outpatient followup. The patient has a psychiatrist whom she was seen in the next 2 days. She was also prescribed another antidepressant which he has not filled yet and states she will fill and start taking this evening. She denies to me that she is suicidal or homicidal and will contract for safety.  Geoffery Lyons, MD 04/15/13 [phone]

## 2013-04-15 NOTE — BH Assessment (Signed)
Tele Assessment Note   Carol [name] is an 27 y.o. female.  Pt presents to [facility] with C/O medical clearance. Pt reports that she put an unknown amount of her prescribed Alprazolam in her mouth today  and her partnered was concerned and brought her to the hospital. Pt reports that she became angry and overwhelmed with all her stressors. Pt states that she had know  intention of harming herself or swallowing the pills. Pt reports " I know it was not the best decision". Pt denies history of prior suicide attempts. Pt's  mother  concurs past  history regarding suicide and reports that patient has not verbalized any SI to her. Pt reports ongoing conflict with her partner as they argue often and are having relationship problems. Pt reports  feeling overwhelmed about her daily commute to work , as pt reports having to travel quite a distance to get to and from work. Pt reports a history of anxiety and denies hx of depression. Pt reports that she has been receiving medication management from her Psychiatrist  for the past four years. Pt reports that she had an appointment with her psychiatrist today who wrote a prescription for Wellbutrin. Pt  reports that she takes her Alprazolam as needed but does not like to take it because it makes her sleepy. Pt denies SI,HI, and no AVH. Pt is able to contract for safety.    Consulted with Governor Rooks, Serena Colonel, NP, and Dr. Judd Lien EDP.  Pt does not warrant inpatient treatment at this time. EDP and Extender's agree that patient can be discharged home with the following recommendations.  TTS obtained collateral from patient's mother who reports that she is concerned about patient because pt has been going through a lot with her real Dad.Pt did not report or indicate an issues or conflict with her real dad. Pt's mom reports that patient had a hx of cutting when she was a "tween" pt denies any issues with cutting. Pt's mother reports that she is scared that  patient may do something. Pt's mother is unable to substantiate anything further about why she is scared. When patient's Mother was questioned about why she was scared, pt's mother said " i don't know" she has been going through a lot. Pt's mother denies that patient has any history of prior or current suicide attempts.  Continued follow-up with current provider and outpatient therapy recommended.  Cato Mulligan at [facility] made aware of discharge plans and agreed to provide patient with crisis resources and outpatient referrals as needed. Cordie Grice also agreed to have patient sign no harm contract in which patient is in agreement with.          Axis I: Anxiety Disorder NOS Axis II: Deferred Axis III:  Past Medical History  Diagnosis Date  . Kidney stones   . Bronchitis   . Anxiety   . Hypertension   . Urinary tract infection   . Irritable bowel   . Depression   . Asthma   . Increased frequency of headaches   . Fatty liver disease, nonalcoholic   . Migraines    Axis IV: occupational problems, other psychosocial or environmental problems and problems related to social environment Axis V: 51-60 moderate symptoms  Past Medical History:  Past Medical History  Diagnosis Date  . Kidney stones   . Bronchitis   . Anxiety   . Hypertension   . Urinary tract infection   . Irritable bowel   .  Depression   . Asthma   . Increased frequency of headaches   . Fatty liver disease, nonalcoholic   . Migraines     History reviewed. No pertinent past surgical history.  Family History:  Family History  Problem Relation Age of Onset  . Physical abuse Mother   . Anxiety disorder Mother   . ADD / ADHD Mother   . Paranoid behavior Mother   . Drug abuse Father   . Depression Father   . Alcohol abuse Maternal Grandfather   . ADD / ADHD Maternal Grandmother   . Anxiety disorder Maternal Grandmother   . Dementia Maternal Grandmother   . OCD Maternal Grandmother   . Sexual abuse  Maternal Grandmother   . Alcohol abuse Paternal Grandfather   . Bipolar disorder Cousin   . Schizophrenia Neg Hx   . Seizures Neg Hx     Social History:  reports that she has been smoking Cigarettes.  She has been smoking about 1.00 pack per day. She does not have any smokeless tobacco history on file. She reports that she does not drink alcohol or use illicit drugs.  Additional Social History:  Alcohol / Drug Use History of alcohol / drug use?: No history of alcohol / drug abuse  CIWA: CIWA-Ar BP: 123/87 mmHg Pulse Rate: 85 COWS:    Allergies:  Allergies  Allergen Reactions  . Sulfa Antibiotics Hives and Shortness Of Breath  . Pseudoephedrine Palpitations and Other (See Comments)    sweats  . Cephalosporins     hives  . Ciprocin-Fluocin-Procin [Fluocinolone Acetonide] Hives  . Penicillins Hives    Home Medications:  (Not in a hospital admission)  OB/GYN Status:  Patient's last menstrual period was 04/06/2013.  General Assessment Data Location of Assessment: [facility] Is this a Tele or Face-to-Face Assessment?: Tele Assessment Is this an Initial Assessment or a Re-assessment for this encounter?: Initial Assessment Living Arrangements: Alone Can pt return to current living arrangement?: Yes Admission Status: Voluntary Is patient capable of signing voluntary admission?: Yes Transfer from: Home Referral Source: MD ([facility]-Consulted w/EDP Dr. Judd Lien regarding pt )     [facility] Crisis Care Plan Living Arrangements: Alone Name of Psychiatrist: Dr. Tenny Craw Name of Therapist: No Current Provider     Risk to self Suicidal Ideation: No Suicidal Intent: No Is patient at risk for suicide?: No Suicidal Plan?: No Access to Means: No What has been your use of drugs/alcohol within the last 12 months?: na Previous Attempts/Gestures: No How many times?: 0 Other Self Harm Risks: pt denies but mom reports that pt had hx of cutting as a "tween" Triggers  for Past Attempts: None known Intentional Self Injurious Behavior: None Family Suicide History: No Recent stressful life event(s): Conflict (Comment);Other (Comment) (relationship conflict) Persecutory voices/beliefs?: No Depression: No Substance abuse history and/or treatment for substance abuse?: No Suicide prevention information given to non-admitted patients: Yes  Risk to Others Homicidal Ideation: No Thoughts of Harm to Others: No Current Homicidal Intent: No Current Homicidal Plan: No Access to Homicidal Means: No Identified Victim: na History of harm to others?: No Assessment of Violence: None Noted Violent Behavior Description: None Noted Does patient have access to weapons?: No Criminal Charges Pending?: No Does patient have a court date: No  Psychosis Hallucinations: None noted Delusions: None noted  Mental Status Report Appear/Hygiene: Other (Comment) (Unremarkable) Eye Contact: Fair Motor Activity: Freedom of movement Speech: Logical/coherent Level of Consciousness: Alert Mood: Anxious Affect: Appropriate to circumstance Anxiety Level:  Minimal Thought Processes: Coherent;Relevant Judgement: Impaired Orientation: Person;Place;Time;Situation Obsessive Compulsive Thoughts/Behaviors: None  Cognitive Functioning Concentration: Normal Memory: Recent Intact;Remote Intact IQ: Average Insight: Fair Impulse Control: Poor Appetite: Fair Weight Loss: 0 Weight Gain: 0 Sleep: No Change Total Hours of Sleep: 7 Vegetative Symptoms: None  ADLScreening [facility] Assessment Services) Patient's cognitive ability adequate to safely complete daily activities?: Yes Patient able to express need for assistance with ADLs?: Yes Independently performs ADLs?: Yes (appropriate for developmental age)  Prior Inpatient Therapy Prior Inpatient Therapy: No Prior Therapy Dates: na Prior Therapy Facilty/Provider(s): na Reason for Treatment: na  Prior Outpatient Therapy Prior  Outpatient Therapy: Yes Prior Therapy Dates: Dr. Tenny Craw  Prior Therapy Facilty/Provider(s): Current Provider Reason for Treatment: Medication Management/Anxiety/Psychiatry  ADL Screening (condition at time of admission) Patient's cognitive ability adequate to safely complete daily activities?: Yes Is the patient deaf or have difficulty hearing?: No Does the patient have difficulty concentrating, remembering, or making decisions?: No Patient able to express need for assistance with ADLs?: Yes Does the patient have difficulty dressing or bathing?: No Independently performs ADLs?: Yes (appropriate for developmental age) Does the patient have difficulty walking or climbing stairs?: No Weakness of Legs: None Weakness of Arms/Hands: None       Abuse/Neglect Assessment (Assessment to be complete while patient is alone) Physical Abuse: Denies Verbal Abuse: Denies Sexual Abuse: Denies Exploitation of patient/patient's resources: Denies Self-Neglect: Denies Values / Beliefs Cultural Requests During Hospitalization: None Spiritual Requests During Hospitalization: None   Advance Directives (For Healthcare) Advance Directive: Patient does not have advance directive;Patient would not like information Nutrition Screen- [facility] Adult/WL/AP Patient's home diet: Regular (given mac &cheese per request)  Additional Information 1:1 In Past 12 Months?: No CIRT Risk: No Elopement Risk: No Does patient have medical clearance?: Yes     Disposition:  Disposition Initial Assessment Completed for this Encounter: Yes Disposition of Patient: Referred to Type of outpatient treatment: Adult Patient referred to: Other (Comment) (Pt referred to current provider)  Bjorn Pippin 04/15/2013 11:04 PM

## 2013-04-15 NOTE — ED Notes (Addendum)
RX Xanax filled on 04/06/2013 #90- 42 intact pills from bottle counted and 5 partially intact pills in plastic bag (significant other states she took these from patient's mouth).

## 2013-04-15 NOTE — ED Notes (Signed)
Pts bottle of Xanax was locked up with Joann, our Pharmacist and cannot be accessed at this time.  Pt sts she takes it BID.  Per discussion with Dr. Judd Lien, Pt will be given her pm dose of Xanax 1mg  at this time and she will have to come back in the morning to get her bottle back.

## 2013-04-15 NOTE — ED Notes (Signed)
Pt refused macrobid for UTI-states she is allergic "to lots of medicine"-she is unsure if she is allergic to macrobid "but i don't want to find out today and have a allergic reaction"-pt requested "go out side and get some fresh air"-advised she was not allowed to leave tx room except to go to [place] with escort-requested nicotine patch

## 2013-04-15 NOTE — ED Notes (Signed)
EDP notified of pt refusal of med and request for nicotine patch

## 2013-04-15 NOTE — ED Notes (Signed)
Per partner, pt put " a whole bottle of Xanax" in her mouth 20 minutes PTA with intention to kill herself.  Pills retrieved from patient's mouth.  Pt uncooperative.

## 2013-04-15 NOTE — ED Notes (Signed)
TTS completed-awaiting call for update on dispostion

## 2013-04-15 NOTE — ED Notes (Signed)
Notified sitter would be here at 3pm

## 2013-04-15 NOTE — ED Notes (Addendum)
MD at bedside discussing dispo plans to home.

## 2013-04-15 NOTE — ED Notes (Signed)
 [facility] called for a sitter.

## 2013-04-16 ENCOUNTER — Telehealth (HOSPITAL_BASED_OUTPATIENT_CLINIC_OR_DEPARTMENT_OTHER): Payer: Self-pay | Admitting: Emergency Medicine

## 2013-04-16 LAB — URINE CULTURE: Colony Count: 50000

## 2013-04-16 NOTE — ED Notes (Signed)
Pt and friend came to ED stating they needed to pick up pills that were locked up for her yesterday. She was here yesterday for an attempted overdose on xanax..  I checked with the pharmacy and made other inquires to staff.  i also asked Dr Holli Humbles about seeing her.  He said he would not write a prescription for even 2 pills but he would be glad to see her and give her one pill here.  The message was given to the pt and friend.  They then wanted to know why the meds weren't given back.  I explained that we normally didn't give the meds back when a possible overdose was involved.They refused to check in for meds tonight. She said she needed to be a work at [employer].  I suggested she get ready for work and be here by 0800 for her meds and then to work.

## 2013-06-14 ENCOUNTER — Emergency Department (HOSPITAL_COMMUNITY): Payer: PRIVATE HEALTH INSURANCE

## 2013-06-14 ENCOUNTER — Emergency Department (HOSPITAL_COMMUNITY)
Admission: EM | Admit: 2013-06-14 | Discharge: 2013-06-15 | Disposition: A | Discharge status: Home / Self Care | Payer: PRIVATE HEALTH INSURANCE | Attending: Emergency Medicine | Admitting: Emergency Medicine

## 2013-06-14 ENCOUNTER — Encounter (HOSPITAL_COMMUNITY): Payer: Self-pay | Admitting: Emergency Medicine

## 2013-06-14 DIAGNOSIS — R110 Nausea: Secondary | ICD-10-CM | POA: Insufficient documentation

## 2013-06-14 DIAGNOSIS — D72829 Elevated white blood cell count, unspecified: Secondary | ICD-10-CM | POA: Insufficient documentation

## 2013-06-14 DIAGNOSIS — R945 Abnormal results of liver function studies: Secondary | ICD-10-CM

## 2013-06-14 DIAGNOSIS — Z87442 Personal history of urinary calculi: Secondary | ICD-10-CM | POA: Insufficient documentation

## 2013-06-14 DIAGNOSIS — F17200 Nicotine dependence, unspecified, uncomplicated: Secondary | ICD-10-CM | POA: Insufficient documentation

## 2013-06-14 DIAGNOSIS — Z880 Allergy status to penicillin: Secondary | ICD-10-CM | POA: Insufficient documentation

## 2013-06-14 DIAGNOSIS — R7402 Elevation of levels of lactic acid dehydrogenase (LDH): Secondary | ICD-10-CM | POA: Insufficient documentation

## 2013-06-14 DIAGNOSIS — G478 Other sleep disorders: Secondary | ICD-10-CM | POA: Insufficient documentation

## 2013-06-14 DIAGNOSIS — R740 Nonspecific elevation of levels of transaminase and lactic acid dehydrogenase [LDH]: Secondary | ICD-10-CM

## 2013-06-14 DIAGNOSIS — Z87440 Personal history of urinary (tract) infections: Secondary | ICD-10-CM | POA: Insufficient documentation

## 2013-06-14 DIAGNOSIS — J45909 Unspecified asthma, uncomplicated: Secondary | ICD-10-CM | POA: Insufficient documentation

## 2013-06-14 DIAGNOSIS — R7401 Elevation of levels of liver transaminase levels: Secondary | ICD-10-CM | POA: Insufficient documentation

## 2013-06-14 DIAGNOSIS — R7989 Other specified abnormal findings of blood chemistry: Secondary | ICD-10-CM | POA: Insufficient documentation

## 2013-06-14 DIAGNOSIS — Z8659 Personal history of other mental and behavioral disorders: Secondary | ICD-10-CM | POA: Insufficient documentation

## 2013-06-14 DIAGNOSIS — R1011 Right upper quadrant pain: Secondary | ICD-10-CM | POA: Insufficient documentation

## 2013-06-14 DIAGNOSIS — Z8719 Personal history of other diseases of the digestive system: Secondary | ICD-10-CM | POA: Insufficient documentation

## 2013-06-14 DIAGNOSIS — I10 Essential (primary) hypertension: Secondary | ICD-10-CM | POA: Insufficient documentation

## 2013-06-14 DIAGNOSIS — R079 Chest pain, unspecified: Secondary | ICD-10-CM

## 2013-06-14 LAB — CBC WITH DIFFERENTIAL/PLATELET
BASOS PCT: 0 % (ref 0–1)
Basophils Absolute: 0 10*3/uL (ref 0.0–0.1)
EOS PCT: 2 % (ref 0–5)
Eosinophils Absolute: 0.2 10*3/uL (ref 0.0–0.7)
HCT: 43.1 % (ref 36.0–46.0)
Hemoglobin: 15.1 g/dL — ABNORMAL HIGH (ref 12.0–15.0)
LYMPHS ABS: 3 10*3/uL (ref 0.7–4.0)
Lymphocytes Relative: 25 % (ref 12–46)
MCH: 31.3 pg (ref 26.0–34.0)
MCHC: 35 g/dL (ref 30.0–36.0)
MCV: 89.2 fL (ref 78.0–100.0)
Monocytes Absolute: 1.4 10*3/uL — ABNORMAL HIGH (ref 0.1–1.0)
Monocytes Relative: 12 % (ref 3–12)
NEUTROS PCT: 62 % (ref 43–77)
Neutro Abs: 7.7 10*3/uL (ref 1.7–7.7)
PLATELETS: 314 10*3/uL (ref 150–400)
RBC: 4.83 MIL/uL (ref 3.87–5.11)
RDW: 12.7 % (ref 11.5–15.5)
WBC: 12.3 10*3/uL — ABNORMAL HIGH (ref 4.0–10.5)

## 2013-06-14 LAB — COMPREHENSIVE METABOLIC PANEL
ALK PHOS: 80 U/L (ref 39–117)
ALT: 81 U/L — ABNORMAL HIGH (ref 0–35)
AST: 112 U/L — AB (ref 0–37)
Albumin: 3.7 g/dL (ref 3.5–5.2)
BUN: 10 mg/dL (ref 6–23)
CALCIUM: 9.1 mg/dL (ref 8.4–10.5)
CO2: 28 mEq/L (ref 19–32)
Chloride: 103 mEq/L (ref 96–112)
Creatinine, Ser: 0.85 mg/dL (ref 0.50–1.10)
GFR calc Af Amer: >90 mL/min (ref >90)
GFR calc non Af Amer: >90 mL/min (ref >90)
Glucose, Bld: 86 mg/dL (ref 70–99)
Potassium: 3.7 mEq/L (ref 3.7–5.3)
SODIUM: 141 meq/L (ref 137–147)
TOTAL PROTEIN: 7 g/dL (ref 6.0–8.3)
Total Bilirubin: 0.6 mg/dL (ref 0.3–1.2)

## 2013-06-14 LAB — LIPASE, BLOOD: Lipase: 36 U/L (ref 11–59)

## 2013-06-14 MED ORDER — ONDANSETRON 4 MG PO TBDP: 4.0000 mg | ORAL_TABLET | Freq: Three times a day (TID) | ORAL | Status: DC | PRN

## 2013-06-14 MED ORDER — GI COCKTAIL ~~LOC~~
30.0000 mL | Freq: Once | ORAL | Status: DC
  Filled 2013-06-14: qty 30

## 2013-06-14 MED ORDER — HYDROMORPHONE HCL PF 1 MG/ML IJ SOLN
1.0000 mg | Freq: Once | INTRAMUSCULAR | Status: DC
  Filled 2013-06-14: qty 1

## 2013-06-14 MED ORDER — ONDANSETRON HCL 4 MG/2ML IJ SOLN
4.0000 mg | Freq: Once | INTRAMUSCULAR | Status: AC
  Administered 2013-06-14: 4 mg via INTRAVENOUS
  Filled 2013-06-14: qty 2

## 2013-06-14 MED ORDER — MORPHINE SULFATE 4 MG/ML IJ SOLN
4.0000 mg | Freq: Once | INTRAMUSCULAR | Status: AC
  Administered 2013-06-14: 4 mg via INTRAVENOUS
  Filled 2013-06-14: qty 1

## 2013-06-14 NOTE — Discharge Instructions (Signed)
As discussed, it is very important that you return here tomorrow morning for your ultrasound.   In the interim, you develop new, or concerning changes in your condition, please return here immediately.   Regardless of the results, please be sure to follow up with her primary care physician as well.

## 2013-06-14 NOTE — ED Notes (Signed)
Pt c/o centralized chest pain (she describes as heaviness)that radiates around to her shoulder blades that is constant x 30 mins. Pt does c/o sob and nausea.

## 2013-06-14 NOTE — ED Provider Notes (Signed)
CSN: [id number]     Arrival date & time 06/14/13  2117 History  This chart was scribed for Gerhard Munch, MD by Blanchard Kelch, ED Scribe. The patient was seen in room APA10/APA10. Patient's care was started at 9:56 PM.    Chief Complaint  Patient presents with  . Chest Pain      The history is provided by the patient. No language interpreter was used.    HPI Comments: AMBYR QADRI is a 28 y.o. female who presents to the Emergency Department complaining of constant right frontal chest pain that began about an hour ago. The pain radiates through to her back between the shoulder blades. She states that she was riding in the car when the pain came on. She rates the pain as an 8/10 in severity. She reports associated nausea, lightheadedness and shortness of breath. She states she had similar pain a few weeks ago but a CT of the abdomen came back normal and she denies having a diagnosis for the pain. She denies confusion, disorientation or syncope. 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 She is a current smoker but denies alcohol use.   Past Medical History  Diagnosis Date  . Kidney stones   . Bronchitis   . Anxiety   . Hypertension   . Urinary tract infection   . Irritable bowel   . Depression   . Asthma   . Increased frequency of headaches   . Fatty liver disease, nonalcoholic   . Migraines    History reviewed. No pertinent past surgical history. Family History  Problem Relation Age of Onset  . Physical abuse Mother   . Anxiety disorder Mother   . ADD / ADHD Mother   . Paranoid behavior Mother   . Drug abuse Father   . Depression Father   . Alcohol abuse Maternal Grandfather   . ADD / ADHD Maternal Grandmother   . Anxiety disorder Maternal Grandmother   . Dementia Maternal Grandmother   . OCD Maternal Grandmother   . Sexual abuse Maternal Grandmother   . Alcohol abuse Paternal Grandfather   .  Bipolar disorder Cousin   . Schizophrenia Neg Hx   . Seizures Neg Hx    History  Substance Use Topics  . Smoking status: Current Every Day Smoker -- 1.00 packs/day    Types: Cigarettes  . Smokeless tobacco: Not on file  . Alcohol Use: No   OB History   Grav Para Term Preterm Abortions TAB SAB Ect Mult Living                 Review of Systems  Constitutional:       Per HPI, otherwise negative  HENT:       Per HPI, otherwise negative  Respiratory:       Per HPI, otherwise negative  Cardiovascular:       Per HPI, otherwise negative  Endocrine:       Negative aside from HPI  Genitourinary:       Neg aside from HPI   Musculoskeletal:       Per HPI, otherwise negative  Skin: Negative.   Neurological: Negative for syncope.      Allergies  Sulfa antibiotics; Pseudoephedrine; Cephalosporins; Ciprocin-fluocin-procin; and Penicillins  Home Medications   Current Outpatient Rx  Name  Route  Sig  Dispense  Refill  . ibuprofen (ADVIL,MOTRIN) 200 MG tablet   Oral   Take 800 mg  by mouth every 6 (six) hours as needed. Patient used this medication for pain.          BP 136/86  Pulse 85  Temp(Src) 97.2 F (36.2 C) (Oral)  Resp 20  Ht 5\' 6"  (1.676 m)  Wt 222 lb (100.699 kg)  BMI 35.85 kg/m2  SpO2 99%  LMP 06/05/2013 Physical Exam  Nursing note and vitals reviewed. Constitutional: She is oriented to person, place, and time. She appears well-developed and well-nourished. No distress.  HENT:  Head: Normocephalic and atraumatic.  Eyes: Conjunctivae and EOM are normal.  Cardiovascular: Normal rate, regular rhythm and normal heart sounds.   No murmur heard. Pulmonary/Chest: Effort normal and breath sounds normal. No stridor. No respiratory distress. She has no wheezes. She has no rales. She exhibits no tenderness.  Abdominal: Soft. She exhibits no distension. There is tenderness in the right upper quadrant.  Musculoskeletal: She exhibits no edema.  Neurological: She is  alert and oriented to person, place, and time. No cranial nerve deficit.  Skin: Skin is warm and dry.  Psychiatric: She has a normal mood and affect.    ED Course  Procedures (including critical care time)     COORDINATION OF CARE: 10:32 PM -Will order chest x-ray, CBC, CMP, blood lipase and UA. Patient verbalizes understanding and agrees with treatment plan.    Labs Review Labs Reviewed - No data to display Imaging Review No results found.  EKG Interpretation    Date/Time:  Sunday June 14 2013 21:39:04 EST Ventricular Rate:  81 PR Interval:  150 QRS Duration: 102 QT Interval:  412 QTC Calculation: 478 R Axis:   29 Text Interpretation:  Normal sinus rhythm Normal ECG No previous ECGs available Confirmed by POLLINA  MD, CHRISTOPHER ([id number]) on 06/14/2013 9:46:56 PM           I reviewed the chart ,including CT 06/01/13 - w/o notable findings  11:42 PM Patient in no distress, aware of all results, hemodynamically stable. MDM  Patient presents with right-sided chest pain, nausea, and a positive Murphy's sign.  Patient's endorsement of biliary disease is consistent with this presentation.  Patient does have mild leukocytosis, mild elevation in transaminases, but no fever, no hemodynamically stability, no persistent vomiting or evidence of distress. Ultrasound is not currently available, and absent distress, patient is appropriate to wait for next day ultrasound, here. Patient will be discharged in stable condition to followup this morning for this study.   Gerhard Munchobert Dodie Parisi, MD 06/14/13 [phone]

## 2013-06-15 ENCOUNTER — Ambulatory Visit (HOSPITAL_COMMUNITY): Payer: PRIVATE HEALTH INSURANCE

## 2013-06-15 MED ORDER — TRAMADOL HCL 50 MG PO TABS: 50.0000 mg | ORAL_TABLET | Freq: Four times a day (QID) | ORAL | Status: DC | PRN

## 2013-06-15 NOTE — ED Notes (Signed)
EDP aware that pt. Is unable to give urine sample at this time. EDP stated that he no longer wanted a UA or urine preg.

## 2013-06-19 ENCOUNTER — Ambulatory Visit (HOSPITAL_COMMUNITY): Payer: PRIVATE HEALTH INSURANCE | Attending: Emergency Medicine

## 2013-07-03 ENCOUNTER — Ambulatory Visit (HOSPITAL_COMMUNITY): Payer: Self-pay | Admitting: Psychiatry

## 2013-07-16 ENCOUNTER — Emergency Department (HOSPITAL_COMMUNITY)
Admission: EM | Admit: 2013-07-16 | Discharge: 2013-07-16 | Disposition: A | Discharge status: Home / Self Care | Payer: 59 | Attending: Emergency Medicine | Admitting: Emergency Medicine

## 2013-07-16 ENCOUNTER — Encounter (HOSPITAL_COMMUNITY): Payer: Self-pay | Admitting: Emergency Medicine

## 2013-07-16 DIAGNOSIS — F17200 Nicotine dependence, unspecified, uncomplicated: Secondary | ICD-10-CM | POA: Insufficient documentation

## 2013-07-16 DIAGNOSIS — K8050 Calculus of bile duct without cholangitis or cholecystitis without obstruction: Secondary | ICD-10-CM

## 2013-07-16 DIAGNOSIS — K8020 Calculus of gallbladder without cholecystitis without obstruction: Secondary | ICD-10-CM | POA: Insufficient documentation

## 2013-07-16 DIAGNOSIS — J45909 Unspecified asthma, uncomplicated: Secondary | ICD-10-CM | POA: Insufficient documentation

## 2013-07-16 DIAGNOSIS — I10 Essential (primary) hypertension: Secondary | ICD-10-CM | POA: Insufficient documentation

## 2013-07-16 DIAGNOSIS — Z87442 Personal history of urinary calculi: Secondary | ICD-10-CM | POA: Insufficient documentation

## 2013-07-16 DIAGNOSIS — Z880 Allergy status to penicillin: Secondary | ICD-10-CM | POA: Insufficient documentation

## 2013-07-16 DIAGNOSIS — Z87440 Personal history of urinary (tract) infections: Secondary | ICD-10-CM | POA: Insufficient documentation

## 2013-07-16 DIAGNOSIS — Z8659 Personal history of other mental and behavioral disorders: Secondary | ICD-10-CM | POA: Insufficient documentation

## 2013-07-16 HISTORY — DX: Calculus of gallbladder without cholecystitis without obstruction: K80.20

## 2013-07-16 MED ORDER — ONDANSETRON 4 MG PO TBDP: 4.0000 mg | ORAL_TABLET | Freq: Three times a day (TID) | ORAL | Status: DC | PRN

## 2013-07-16 MED ORDER — HYDROCODONE-ACETAMINOPHEN 5-325 MG PO TABS
2.0000 | ORAL_TABLET | Freq: Once | ORAL | Status: AC
  Administered 2013-07-16: 2 via ORAL
  Filled 2013-07-16: qty 2

## 2013-07-16 MED ORDER — ONDANSETRON 4 MG PO TBDP
4.0000 mg | ORAL_TABLET | Freq: Once | ORAL | Status: AC
  Administered 2013-07-16: 4 mg via ORAL
  Filled 2013-07-16: qty 1

## 2013-07-16 MED ORDER — HYDROCODONE-ACETAMINOPHEN 5-325 MG PO TABS: 2.0000 | ORAL_TABLET | ORAL | Status: DC | PRN

## 2013-07-16 NOTE — ED Notes (Signed)
Pt. Reports being diagnosed with multiple gall stones. Pt. States that she was seen [facility] today. Pt. States that Dr. Timmothy EulerLekwuwa ED[facility] at [facility] as well as Dr. Clelia CroftShaw her PCP instructed her come to [facility] to have a cholecystectomy. Pt. Reports that she did not want to have the [facility] because her insurance will pay for [facility]. Pt. Arrived with discharge paperwork from [facility], lab papers as well as a diagnostic imaging CD from [facility]. [facility] from [facility] state that she should contact Dr. Clelia CroftShaw to schedule follow-up care.

## 2013-07-16 NOTE — ED Notes (Signed)
Spoke with Dahlia ClientHannah from registration. She reported that the pt stated [facility] was [facility] to this hospital to be a direct admission for gallbladder surgery. Dahlia ClientHannah reports that she called [facility] who informed her that the pt was not sent to our facility by them but rather was seen there today and discharged home. Dahlia ClientHannah also reported that she paged Dr. Lovell SheehanJenkins to confirm that he had not been contacted regarding this surgery and he confirmed that he had not.

## 2013-07-16 NOTE — ED Provider Notes (Signed)
CSN: [id number]     Arrival date & time 07/16/13  2011 History   First MD Initiated Contact with Patient 07/16/13 2139 This chart was scribed for Rolland PorterMark Brihana Quickel, MD by Valera CastleSteven Perry, ED Scribe. This patient was seen in room APA14/APA14 and the patient's care was started at 9:59 PM.     Chief Complaint  Patient presents with  . Cholelithiasis   (Consider location/radiation/quality/duration/timing/severity/associated sxs/prior Treatment) The history is provided by the patient. No language interpreter was used.   HPI Comments: Carol [name] is a 28 y.o. [facility], who presents to the Emergency Department complaining of constant back pain and abdominal pain, onset, over 1 year ago.. Pt was seen here 06/14/2013 for chest pain. She states her pain initially started with chest pain, but progressively moved to her back. She states the episodes of back pain occur once every week. She reports associated nausea and vomiting. She states that eating greasy foods exacerbate her pain. She states her urine has been dark recently, but today it was much lighter. She states [facility]. She denies and any other associated symptoms.   PCP Kirstie Peri- SHAH,ASHISH, MD   Past Medical History  Diagnosis Date  . Bronchitis   . Anxiety   . Hypertension   . Urinary tract infection   . Irritable bowel   . Depression   . Asthma   . Increased frequency of headaches   . Fatty liver disease, nonalcoholic   . Migraines   . Kidney stones   . Gall stones    History reviewed. No pertinent past surgical history. Family History  Problem Relation Age of Onset  . Physical abuse Mother   . Anxiety disorder Mother   . ADD / ADHD Mother   . Paranoid behavior Mother   . Drug abuse Father   . Depression Father   . Alcohol abuse Maternal Grandfather   . ADD / ADHD Maternal Grandmother   . Anxiety disorder Maternal Grandmother   . Dementia Maternal Grandmother    . OCD Maternal Grandmother   . Sexual abuse Maternal Grandmother   . Alcohol abuse Paternal Grandfather   . Bipolar disorder Cousin   . Schizophrenia Neg Hx   . Seizures Neg Hx    History  Substance Use Topics  . Smoking status: Current Every Day Smoker -- 1.00 packs/day    Types: Cigarettes  . Smokeless tobacco: Not on file  . Alcohol Use: No   OB History   Grav Para Term Preterm Abortions TAB SAB Ect Mult Living                 Review of Systems  Constitutional: Negative for fever, chills, diaphoresis, appetite change and fatigue.  HENT: Negative for mouth sores, sore throat and trouble swallowing.   Eyes: Negative for visual disturbance.  Respiratory: Negative for cough, chest tightness, shortness of breath and wheezing.   Cardiovascular: Negative for chest pain.  Gastrointestinal: Positive for nausea, vomiting and abdominal pain (RUQ). Negative for diarrhea and abdominal distention.  Endocrine: Negative for polydipsia, polyphagia and polyuria.  Genitourinary: Negative for dysuria, frequency and hematuria.  Musculoskeletal: Positive for back pain. Negative for gait problem.  Skin: Negative for color change, pallor and rash.  Neurological: Negative for dizziness, syncope, light-headedness and headaches.  Hematological: Does not bruise/bleed easily.  Psychiatric/Behavioral: Negative for behavioral problems and confusion.   Allergies  Sulfa antibiotics; Pseudoephedrine; Cephalosporins; Ciprocin-fluocin-procin; Hydrochlorothiazide; and Penicillins  Home Medications  Current Outpatient Rx  Name  Route  Sig  Dispense  Refill  . ibuprofen (ADVIL,MOTRIN) 200 MG tablet   Oral   Take 800 mg by mouth every 6 (six) hours as needed. Patient used this medication for pain.         Marland Kitchen HYDROcodone-acetaminophen (NORCO/VICODIN) 5-325 MG per tablet   Oral   Take 2 tablets by mouth every 4 (four) hours as needed.   20 tablet   0   . ondansetron (ZOFRAN ODT) 4 MG disintegrating  tablet   Oral   Take 1 tablet (4 mg total) by mouth every 8 (eight) hours as needed for nausea.   20 tablet   0    BP 133/90  Pulse 77  Temp(Src) 97.8 F (36.6 C) (Oral)  Resp 20  Ht 5\' 6"  (1.676 m)  Wt 220 lb (99.791 kg)  BMI 35.53 kg/m2  SpO2 99%  LMP 07/12/2013  Physical Exam  Constitutional: She is oriented to person, place, and time. She appears well-developed and well-nourished. No distress.  HENT:  Head: Normocephalic.  Eyes: Conjunctivae are normal. Pupils are equal, round, and reactive to light. No scleral icterus.  Neck: Normal range of motion. Neck supple. No thyromegaly present.  Cardiovascular: Normal rate and regular rhythm.  Exam reveals no gallop and no friction rub.   No murmur heard. Pulmonary/Chest: Effort normal and breath sounds normal. No respiratory distress. She has no wheezes. She has no rales.  Abdominal: Soft. Bowel sounds are normal. She exhibits no distension. There is tenderness (RUQ) in the right upper quadrant. There is positive Murphy's sign.  Musculoskeletal: Normal range of motion.  Neurological: She is alert and oriented to person, place, and time.  Skin: Skin is warm and dry. No rash noted.  Psychiatric: She has a normal mood and affect. Her behavior is normal.    ED Course  Procedures (including critical care time)  DIAGNOSTIC STUDIES: Oxygen Saturation is 99% on room air, normal by my interpretation.    COORDINATION OF CARE: 10:02 PM-Discussed treatment plan which includes nausea and pain medication with pt at bedside and pt agreed to plan.    EKG Interpretation None     Medications  ondansetron (ZOFRAN-ODT) disintegrating tablet 4 mg (4 mg Oral Given 07/16/13 2215)  HYDROcodone-acetaminophen (NORCO/VICODIN) 5-325 MG per tablet 2 tablet (2 tablets Oral Given 07/16/13 2215)   MDM   Final diagnoses:  Biliary colic    Stones but no signs of cholecystitis per her labs done less than 2 hours ago. Plan will be surgical followup  and symptomatic treatment.   I personally performed the services described in this documentation, which was scribed in my presence. The recorded information has been reviewed and is accurate.   Rolland Porter, MD 07/16/13 2342

## 2013-07-16 NOTE — Discharge Instructions (Signed)
Plan low fat diet. Call surgeon tomorrow to make an outpatient appointment.  Biliary Colic  Biliary colic is a steady or irregular pain in the upper abdomen. It is usually under the right side of the rib cage. It happens when gallstones interfere with the normal flow of bile from the gallbladder. Bile is a liquid that helps to digest fats. Bile is made in the liver and stored in the gallbladder. When you eat a meal, bile passes from the gallbladder through the cystic duct and the common bile duct into the small intestine. There, it mixes with partially digested food. If a gallstone blocks either of these ducts, the normal flow of bile is blocked. The muscle cells in the bile duct contract forcefully to try to move the stone. This causes the pain of biliary colic.  SYMPTOMS   A person with biliary colic usually complains of pain in the upper abdomen. This pain can be:  In the center of the upper abdomen just below the breastbone.  In the upper-right part of the abdomen, near the gallbladder and liver.  Spread back toward the right shoulder blade.  Nausea and vomiting.  The pain usually occurs after eating.  Biliary colic is usually triggered by the digestive system's demand for bile. The demand for bile is high after fatty meals. Symptoms can also occur when a person who has been fasting suddenly eats a very large meal. Most episodes of biliary colic pass after 1 to 5 hours. After the most intense pain passes, your abdomen may continue to ache mildly for about 24 hours. DIAGNOSIS  After you describe your symptoms, your caregiver will perform a physical exam. He or she will pay attention to the upper right portion of your belly (abdomen). This is the area of your liver and gallbladder. An ultrasound will help your caregiver look for gallstones. Specialized scans of the gallbladder may also be done. Blood tests may be done, especially if you have fever or if your pain persists. PREVENTION    Biliary colic can be prevented by controlling the risk factors for gallstones. Some of these risk factors, such as heredity, increasing age, and pregnancy are a normal part of life. Obesity and a high-fat diet are risk factors you can change through a healthy lifestyle. Women going through menopause who take hormone replacement therapy (estrogen) are also more likely to develop biliary colic. TREATMENT   Pain medication may be prescribed.  You may be encouraged to eat a fat-free diet.  If the first episode of biliary colic is severe, or episodes of colic keep retuning, surgery to remove the gallbladder (cholecystectomy) is usually recommended. This procedure can be done through small incisions using an instrument called a laparoscope. The procedure often requires a brief stay in the hospital. Some people can leave the hospital the same day. It is the most widely used treatment in people troubled by painful gallstones. It is effective and safe, with no complications in more than 90% of cases.  If surgery cannot be done, medication that dissolves gallstones may be used. This medication is expensive and can take months or years to work. Only small stones will dissolve.  Rarely, medication to dissolve gallstones is combined with a procedure called shock-wave lithotripsy. This procedure uses carefully aimed shock waves to break up gallstones. In many people treated with this procedure, gallstones form again within a few years. PROGNOSIS  If gallstones block your cystic duct or common bile duct, you are at risk for repeated  episodes of biliary colic. There is also a 25% chance that you will develop a gallbladder infection(acute cholecystitis), or some other complication of gallstones within 10 to 20 years. If you have surgery, schedule it at a time that is convenient for you and at a time when you are not sick. HOME CARE INSTRUCTIONS   Drink plenty of clear fluids.  Avoid fatty, greasy or fried foods,  or any foods that make your pain worse.  Take medications as directed. SEEK MEDICAL CARE IF:   You develop a fever over 100.5 F (38.1 C).  Your pain gets worse over time.  You develop nausea that prevents you from eating and drinking.  You develop vomiting. SEEK IMMEDIATE MEDICAL CARE IF:   You have continuous or severe belly (abdominal) pain which is not relieved with medications.  You develop nausea and vomiting which is not relieved with medications.  You have symptoms of biliary colic and you suddenly develop a fever and shaking chills. This may signal cholecystitis. Call your caregiver immediately.  You develop a yellow color to your skin or the white part of your eyes (jaundice). Document Released: 09/17/2005 Document Revised: 07/09/2011 Document Reviewed: 11/27/2007 [facility] Patient Information 2014 [place], [place].

## 2013-07-16 NOTE — ED Notes (Signed)
abd pain, and back pain, [facility]  [facility]

## 2013-08-14 ENCOUNTER — Emergency Department (HOSPITAL_BASED_OUTPATIENT_CLINIC_OR_DEPARTMENT_OTHER)
Admission: EM | Admit: 2013-08-14 | Discharge: 2013-08-14 | Disposition: A | Discharge status: Home / Self Care | Payer: 59 | Attending: Emergency Medicine | Admitting: Emergency Medicine

## 2013-08-14 ENCOUNTER — Emergency Department (HOSPITAL_BASED_OUTPATIENT_CLINIC_OR_DEPARTMENT_OTHER): Payer: 59

## 2013-08-14 ENCOUNTER — Encounter (HOSPITAL_BASED_OUTPATIENT_CLINIC_OR_DEPARTMENT_OTHER): Payer: Self-pay | Admitting: Emergency Medicine

## 2013-08-14 DIAGNOSIS — Z8659 Personal history of other mental and behavioral disorders: Secondary | ICD-10-CM | POA: Insufficient documentation

## 2013-08-14 DIAGNOSIS — Z3202 Encounter for pregnancy test, result negative: Secondary | ICD-10-CM | POA: Insufficient documentation

## 2013-08-14 DIAGNOSIS — Z8679 Personal history of other diseases of the circulatory system: Secondary | ICD-10-CM | POA: Insufficient documentation

## 2013-08-14 DIAGNOSIS — F17200 Nicotine dependence, unspecified, uncomplicated: Secondary | ICD-10-CM | POA: Insufficient documentation

## 2013-08-14 DIAGNOSIS — Z87440 Personal history of urinary (tract) infections: Secondary | ICD-10-CM | POA: Insufficient documentation

## 2013-08-14 DIAGNOSIS — J45909 Unspecified asthma, uncomplicated: Secondary | ICD-10-CM | POA: Insufficient documentation

## 2013-08-14 DIAGNOSIS — Z880 Allergy status to penicillin: Secondary | ICD-10-CM | POA: Insufficient documentation

## 2013-08-14 DIAGNOSIS — K8020 Calculus of gallbladder without cholecystitis without obstruction: Secondary | ICD-10-CM | POA: Insufficient documentation

## 2013-08-14 DIAGNOSIS — I10 Essential (primary) hypertension: Secondary | ICD-10-CM | POA: Insufficient documentation

## 2013-08-14 LAB — CBC WITH DIFFERENTIAL/PLATELET
Basophils Absolute: 0 10*3/uL (ref 0.0–0.1)
Basophils Relative: 0 % (ref 0–1)
EOS ABS: 0.2 10*3/uL (ref 0.0–0.7)
Eosinophils Relative: 2 % (ref 0–5)
HCT: 40.8 % (ref 36.0–46.0)
Hemoglobin: 14 g/dL (ref 12.0–15.0)
LYMPHS ABS: 3.4 10*3/uL (ref 0.7–4.0)
LYMPHS PCT: 33 % (ref 12–46)
MCH: 31.5 pg (ref 26.0–34.0)
MCHC: 34.3 g/dL (ref 30.0–36.0)
MCV: 91.7 fL (ref 78.0–100.0)
Monocytes Absolute: 1.2 10*3/uL — ABNORMAL HIGH (ref 0.1–1.0)
Monocytes Relative: 12 % (ref 3–12)
NEUTROS PCT: 53 % (ref 43–77)
Neutro Abs: 5.4 10*3/uL (ref 1.7–7.7)
PLATELETS: 334 10*3/uL (ref 150–400)
RBC: 4.45 MIL/uL (ref 3.87–5.11)
RDW: 12.5 % (ref 11.5–15.5)
WBC: 10.3 10*3/uL (ref 4.0–10.5)

## 2013-08-14 LAB — COMPREHENSIVE METABOLIC PANEL
ALT: 20 U/L (ref 0–35)
AST: 16 U/L (ref 0–37)
Albumin: 3.9 g/dL (ref 3.5–5.2)
Alkaline Phosphatase: 53 U/L (ref 39–117)
BUN: 12 mg/dL (ref 6–23)
CO2: 26 meq/L (ref 19–32)
Calcium: 9 mg/dL (ref 8.4–10.5)
Chloride: 106 mEq/L (ref 96–112)
Creatinine, Ser: 0.9 mg/dL (ref 0.50–1.10)
GFR calc non Af Amer: 88 mL/min — ABNORMAL LOW (ref >90)
GLUCOSE: 92 mg/dL (ref 70–99)
Potassium: 4.2 mEq/L (ref 3.7–5.3)
SODIUM: 145 meq/L (ref 137–147)
TOTAL PROTEIN: 6.9 g/dL (ref 6.0–8.3)
Total Bilirubin: <0.2 mg/dL — ABNORMAL LOW (ref 0.3–1.2)

## 2013-08-14 LAB — URINALYSIS, ROUTINE W REFLEX MICROSCOPIC
Bilirubin Urine: NEGATIVE
Glucose, UA: NEGATIVE mg/dL
Ketones, ur: NEGATIVE mg/dL
Nitrite: NEGATIVE
Protein, ur: NEGATIVE mg/dL
SPECIFIC GRAVITY, URINE: 1.028 (ref 1.005–1.030)
UROBILINOGEN UA: 0.2 mg/dL (ref 0.0–1.0)
pH: 7 (ref 5.0–8.0)

## 2013-08-14 LAB — URINE MICROSCOPIC-ADD ON

## 2013-08-14 LAB — PREGNANCY, URINE: PREG TEST UR: NEGATIVE

## 2013-08-14 LAB — LIPASE, BLOOD: Lipase: 26 U/L (ref 11–59)

## 2013-08-14 MED ORDER — ONDANSETRON 4 MG PO TBDP: 4.0000 mg | ORAL_TABLET | Freq: Three times a day (TID) | ORAL | Status: DC | PRN

## 2013-08-14 MED ORDER — SODIUM CHLORIDE 0.9 % IV SOLN
Freq: Once | INTRAVENOUS | Status: AC
  Administered 2013-08-14: 18:00:00 via INTRAVENOUS

## 2013-08-14 MED ORDER — ONDANSETRON HCL 4 MG/2ML IJ SOLN
4.0000 mg | Freq: Once | INTRAMUSCULAR | Status: AC
  Administered 2013-08-14: 4 mg via INTRAMUSCULAR
  Filled 2013-08-14: qty 2

## 2013-08-14 MED ORDER — HYDROMORPHONE HCL PF 1 MG/ML IJ SOLN
1.0000 mg | Freq: Once | INTRAMUSCULAR | Status: AC
  Administered 2013-08-14: 1 mg via INTRAVENOUS
  Filled 2013-08-14: qty 1

## 2013-08-14 MED ORDER — HYDROCODONE-ACETAMINOPHEN 5-325 MG PO TABS: 2.0000 | ORAL_TABLET | ORAL | Status: DC | PRN

## 2013-08-14 NOTE — ED Notes (Signed)
Patient transported to Ultrasound 

## 2013-08-14 NOTE — ED Notes (Addendum)
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

## 2013-08-14 NOTE — ED Provider Notes (Signed)
Medical screening examination/treatment/procedure(s) were performed by non-physician practitioner and as supervising physician I was immediately available for consultation/collaboration.   EKG Interpretation None        Dagmar HaitWilliam Antwane Grose, MD 08/14/13 2035

## 2013-08-14 NOTE — Discharge Instructions (Signed)
Cholelithiasis °Cholelithiasis (also called gallstones) is a form of gallbladder disease in which gallstones form in your gallbladder. The gallbladder is an organ that stores bile made in the liver, which helps digest fats. Gallstones begin as small crystals and slowly grow into stones. Gallstone pain occurs when the gallbladder spasms and a gallstone is blocking the duct. Pain can also occur when a stone passes out of the duct.  °RISK FACTORS °· Being female.   °· Having multiple pregnancies. Health care providers sometimes advise removing diseased gallbladders before future pregnancies.   °· Being obese. °· Eating a diet heavy in fried foods and fat.   °· Being older than 60 years and increasing age.   °· Prolonged use of medicines containing female hormones.   °· Having diabetes mellitus.   °· Rapidly losing weight.   °· Having a family history of gallstones (heredity).   °SYMPTOMS °· Nausea.   °· Vomiting. °· Abdominal pain.   °· Yellowing of the skin (jaundice).   °· Sudden pain. It may persist from several minutes to several hours. °· Fever.   °· Tenderness to the touch.  °In some cases, when gallstones do not move into the bile duct, people have no pain or symptoms. These are called "silent" gallstones.  °TREATMENT °Silent gallstones do not need treatment. In severe cases, emergency surgery may be required. Options for treatment include: °· Surgery to remove the gallbladder. This is the most common treatment. °· Medicines. These do not always work and may take 6 12 months or more to work. °· Shock wave treatment (extracorporeal biliary lithotripsy). In this treatment an ultrasound machine sends shock waves to the gallbladder to break gallstones into smaller pieces that can pass into the intestines or be dissolved by medicine. °HOME CARE INSTRUCTIONS  °· Only take over-the-counter or prescription medicines for pain, discomfort, or fever as directed by your health care provider.   °· Follow a low-fat diet until  seen again by your health care provider. Fat causes the gallbladder to contract, which can result in pain.   °· Follow up with your health care provider as directed. Attacks are almost always recurrent and surgery is usually required for permanent treatment.   °SEEK IMMEDIATE MEDICAL CARE IF:  °· Your pain increases and is not controlled by medicines.   °· You have a fever or persistent symptoms for more than 2 3 days.   °· You have a fever and your symptoms suddenly get worse.   °· You have persistent nausea and vomiting.   °MAKE SURE YOU:  °· Understand these instructions. °· Will watch your condition. °· Will get help right away if you are not doing well or get worse. °Document Released: 04/12/2005 Document Revised: 12/17/2012 Document Reviewed: 10/08/2012 °ExitCare® Patient Information ©2014 [employer] LLC. ° °Abdominal Pain, Adult °Many things can cause abdominal pain. Usually, abdominal pain is not caused by a disease and will improve without treatment. It can often be observed and treated at home. Your health care provider will do a physical exam and possibly order blood tests and X-rays to help determine the seriousness of your pain. However, in many cases, more time must pass before a clear cause of the pain can be found. Before that point, your health care provider may not know if you need more testing or further treatment. °HOME CARE INSTRUCTIONS  °Monitor your abdominal pain for any changes. The following actions may help to alleviate any discomfort you are experiencing: °· Only take over-the-counter or prescription medicines as directed by your health care provider. °· Do not take laxatives unless directed to do so by   your health care provider. °· Try a clear liquid diet (broth, tea, or water) as directed by your health care provider. Slowly move to a bland diet as tolerated. °SEEK MEDICAL CARE IF: °· You have unexplained abdominal pain. °· You have abdominal pain associated with nausea or  diarrhea. °· You have pain when you urinate or have a bowel movement. °· You experience abdominal pain that wakes you in the night. °· You have abdominal pain that is worsened or improved by eating food. °· You have abdominal pain that is worsened with eating fatty foods. °SEEK IMMEDIATE MEDICAL CARE IF:  °· Your pain does not go away within 2 hours. °· You have a fever. °· You keep throwing up (vomiting). °· Your pain is felt only in portions of the abdomen, such as the right side or the left lower portion of the abdomen. °· You pass bloody or black tarry stools. °MAKE SURE YOU: °· Understand these instructions.   °· Will watch your condition.   °· Will get help right away if you are not doing well or get worse.   °Document Released: 01/24/2005 Document Revised: 02/04/2013 Document Reviewed: 12/24/2012 °ExitCare® Patient Information ©2014 [employer]. ° °

## 2013-08-14 NOTE — ED Provider Notes (Signed)
CSN: [id number]     Arrival date & time 08/14/13  1616 History   First MD Initiated Contact with Patient 08/14/13 1728     Chief Complaint  Patient presents with  . Cholelithiasis     (Consider location/radiation/quality/duration/timing/severity/associated sxs/prior Treatment) Patient is a 28 y.o. female presenting with abdominal pain. The history is provided by the patient. No language interpreter was used.  Abdominal Pain Pain location:  Generalized Pain quality: aching   Pain radiates to:  RUQ Pain severity:  Moderate Onset quality:  Sudden Timing:  Constant Progression:  Worsening Chronicity:  New Relieved by:  Nothing Worsened by:  Nothing tried Ineffective treatments:  None tried   Past Medical History  Diagnosis Date  . Bronchitis   . Anxiety   . Hypertension   . Urinary tract infection   . Irritable bowel   . Depression   . Asthma   . Increased frequency of headaches   . Fatty liver disease, nonalcoholic   . Migraines   . Kidney stones   . Gall stones    History reviewed. No pertinent past surgical history. Family History  Problem Relation Age of Onset  . Physical abuse Mother   . Anxiety disorder Mother   . ADD / ADHD Mother   . Paranoid behavior Mother   . Drug abuse Father   . Depression Father   . Alcohol abuse Maternal Grandfather   . ADD / ADHD Maternal Grandmother   . Anxiety disorder Maternal Grandmother   . Dementia Maternal Grandmother   . OCD Maternal Grandmother   . Sexual abuse Maternal Grandmother   . Alcohol abuse Paternal Grandfather   . Bipolar disorder Cousin   . Schizophrenia Neg Hx   . Seizures Neg Hx    History  Substance Use Topics  . Smoking status: Current Every Day Smoker -- 1.00 packs/day    Types: Cigarettes  . Smokeless tobacco: Not on file  . Alcohol Use: No   OB History   Grav Para Term Preterm Abortions TAB SAB Ect Mult Living                 Review of Systems  Gastrointestinal: Positive for abdominal  pain.  All other systems reviewed and are negative.     Allergies  Sulfa antibiotics; Pseudoephedrine; Cephalosporins; Ciprocin-fluocin-procin; Hydrochlorothiazide; and Penicillins  Home Medications   Prior to Admission medications   Medication Sig Start Date End Date Taking? Authorizing Provider  HYDROcodone-acetaminophen (NORCO/VICODIN) 5-325 MG per tablet Take 2 tablets by mouth every 4 (four) hours as needed. 07/16/13   Rolland PorterMark James, MD  ibuprofen (ADVIL,MOTRIN) 200 MG tablet Take 800 mg by mouth every 6 (six) hours as needed. Patient used this medication for pain.    Historical Provider, MD  ondansetron (ZOFRAN ODT) 4 MG disintegrating tablet Take 1 tablet (4 mg total) by mouth every 8 (eight) hours as needed for nausea. 07/16/13   Rolland PorterMark James, MD   BP 135/94  Pulse 90  Temp(Src) 98.6 F (37 C) (Oral)  Resp 16  Ht 5\' 7"  (1.702 m)  Wt 220 lb (99.791 kg)  BMI 34.45 kg/m2  SpO2 100%  LMP 08/06/2013 Physical Exam  Nursing note and vitals reviewed. Constitutional: She is oriented to person, place, and time. She appears well-developed and well-nourished.  HENT:  Head: Normocephalic and atraumatic.  Right Ear: External ear normal.  Left Ear: External ear normal.  Eyes: EOM are normal. Pupils are equal, round, and reactive to light.  Neck: Normal range  of motion.  Cardiovascular: Normal rate.   Pulmonary/Chest: Effort normal.  Abdominal: She exhibits no distension. There is tenderness.  Musculoskeletal: Normal range of motion.  Neurological: She is alert and oriented to person, place, and time.  Psychiatric: She has a normal mood and affect.    ED Course  Procedures (including critical care time) Labs Review Labs Reviewed  URINALYSIS, ROUTINE W REFLEX MICROSCOPIC - Abnormal; Notable for the following:    APPearance CLOUDY (*)    Hgb urine dipstick TRACE (*)    Leukocytes, UA MODERATE (*)    All other components within normal limits  URINE MICROSCOPIC-ADD ON - Abnormal;  Notable for the following:    Squamous Epithelial / LPF FEW (*)    Bacteria, UA FEW (*)    All other components within normal limits  CBC WITH DIFFERENTIAL - Abnormal; Notable for the following:    Monocytes Absolute 1.2 (*)    All other components within normal limits  COMPREHENSIVE METABOLIC PANEL - Abnormal; Notable for the following:    Total Bilirubin <0.2 (*)    GFR calc non Af Amer 88 (*)    All other components within normal limits  PREGNANCY, URINE  LIPASE, BLOOD    Imaging Review [place] Abdomen Complete  08/14/2013   CLINICAL DATA:  Abdominal pain  EXAM: ULTRASOUND ABDOMEN COMPLETE  COMPARISON:  June 16, 2013  FINDINGS: Gallbladder:  Within the gallbladder, there are multiple echogenic foci which move and shadow consistent with gallstones. Largest gallstone measures 1.0 x 0.4 x 0.7 cm. There is no gallbladder wall thickening or pericholecystic fluid. No sonographic Murphy sign noted.  Common bile duct:  Diameter: 3 mm. There is no intrahepatic, common hepatic, or common bile duct dilatation.  Liver:  No focal lesion identified.  Liver echogenicity is increased.  IVC:  No abnormality visualized.  Pancreas:  Visualized portion unremarkable. Portions of the pancreas are obscured by gas.  Spleen:  Size and appearance within normal limits.  Right Kidney:  Length: 11.5 cm. Echogenicity within normal limits. No mass or hydronephrosis visualized.  Left Kidney:  Length: 12.4 cm. Echogenicity within normal limits. No mass or hydronephrosis visualized.  Abdominal aorta:  No aneurysm visualized.  Other findings:  No demonstrable ascites.  IMPRESSION: Cholelithiasis. Increased echogenicity in the liver, most likely due to fatty change. While no focal liver lesions are identified, it must be cautioned that the sensitivity of ultrasound for focal liver lesions is diminished given underlying fatty change. Much of the pancreas is obscured by gas. Study otherwise unremarkable.   Electronically Signed   By:  Bretta BangWilliam  Woodruff M.D.   On: 08/14/2013 18:37     EKG Interpretation None      MDM   Final diagnoses:  Cholelithiasis    Pt feels better after iv fluids and pain medication.   lft's normal.  Cbc is normal    Ultrasound shows gallstones but no wall thickening.    I advised pt to schedule to see the General surgeon for evaluation  Hydrocodone zofran    Elson AreasLeslie K Bradrick Kamau, PA-C 08/14/13 2022

## 2013-08-18 ENCOUNTER — Ambulatory Visit (INDEPENDENT_AMBULATORY_CARE_PROVIDER_SITE_OTHER): Payer: 59 | Admitting: Psychiatry

## 2013-08-18 ENCOUNTER — Encounter (HOSPITAL_COMMUNITY): Payer: Self-pay | Admitting: Psychiatry

## 2013-08-18 VITALS — BP 110/80 | Ht 67.0 in | Wt 227.0 lb

## 2013-08-18 DIAGNOSIS — F411 Generalized anxiety disorder: Secondary | ICD-10-CM

## 2013-08-18 DIAGNOSIS — F909 Attention-deficit hyperactivity disorder, unspecified type: Secondary | ICD-10-CM

## 2013-08-18 MED ORDER — ALPRAZOLAM 1 MG PO TABS: 1.0000 mg | ORAL_TABLET | Freq: Three times a day (TID) | ORAL | Status: DC | PRN
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
## 2013-08-24 ENCOUNTER — Emergency Department (HOSPITAL_BASED_OUTPATIENT_CLINIC_OR_DEPARTMENT_OTHER)
Admission: EM | Admit: 2013-08-24 | Discharge: 2013-08-25 | Disposition: A | Discharge status: Home / Self Care | Payer: 59 | Attending: Emergency Medicine | Admitting: Emergency Medicine

## 2013-08-24 ENCOUNTER — Encounter (HOSPITAL_BASED_OUTPATIENT_CLINIC_OR_DEPARTMENT_OTHER): Payer: Self-pay | Admitting: Emergency Medicine

## 2013-08-24 DIAGNOSIS — Z3202 Encounter for pregnancy test, result negative: Secondary | ICD-10-CM | POA: Insufficient documentation

## 2013-08-24 DIAGNOSIS — Z87440 Personal history of urinary (tract) infections: Secondary | ICD-10-CM | POA: Insufficient documentation

## 2013-08-24 DIAGNOSIS — F329 Major depressive disorder, single episode, unspecified: Secondary | ICD-10-CM | POA: Insufficient documentation

## 2013-08-24 DIAGNOSIS — Z79899 Other long term (current) drug therapy: Secondary | ICD-10-CM | POA: Insufficient documentation

## 2013-08-24 DIAGNOSIS — K8050 Calculus of bile duct without cholangitis or cholecystitis without obstruction: Secondary | ICD-10-CM

## 2013-08-24 DIAGNOSIS — K8020 Calculus of gallbladder without cholecystitis without obstruction: Secondary | ICD-10-CM | POA: Insufficient documentation

## 2013-08-24 DIAGNOSIS — F411 Generalized anxiety disorder: Secondary | ICD-10-CM | POA: Insufficient documentation

## 2013-08-24 DIAGNOSIS — Z87442 Personal history of urinary calculi: Secondary | ICD-10-CM | POA: Insufficient documentation

## 2013-08-24 DIAGNOSIS — F3289 Other specified depressive episodes: Secondary | ICD-10-CM | POA: Insufficient documentation

## 2013-08-24 DIAGNOSIS — J45909 Unspecified asthma, uncomplicated: Secondary | ICD-10-CM | POA: Insufficient documentation

## 2013-08-24 DIAGNOSIS — I10 Essential (primary) hypertension: Secondary | ICD-10-CM | POA: Insufficient documentation

## 2013-08-24 DIAGNOSIS — Z880 Allergy status to penicillin: Secondary | ICD-10-CM | POA: Insufficient documentation

## 2013-08-24 DIAGNOSIS — F17200 Nicotine dependence, unspecified, uncomplicated: Secondary | ICD-10-CM | POA: Insufficient documentation

## 2013-08-24 LAB — CBC WITH DIFFERENTIAL/PLATELET
BASOS ABS: 0 10*3/uL (ref 0.0–0.1)
Basophils Relative: 0 % (ref 0–1)
Eosinophils Absolute: 0.3 10*3/uL (ref 0.0–0.7)
Eosinophils Relative: 2 % (ref 0–5)
HEMATOCRIT: 41.7 % (ref 36.0–46.0)
Hemoglobin: 14.2 g/dL (ref 12.0–15.0)
Lymphocytes Relative: 32 % (ref 12–46)
Lymphs Abs: 3.7 10*3/uL (ref 0.7–4.0)
MCH: 31.1 pg (ref 26.0–34.0)
MCHC: 34.1 g/dL (ref 30.0–36.0)
MCV: 91.4 fL (ref 78.0–100.0)
Monocytes Absolute: 1.2 10*3/uL — ABNORMAL HIGH (ref 0.1–1.0)
Monocytes Relative: 11 % (ref 3–12)
NEUTROS ABS: 6.3 10*3/uL (ref 1.7–7.7)
Neutrophils Relative %: 55 % (ref 43–77)
PLATELETS: 309 10*3/uL (ref 150–400)
RBC: 4.56 MIL/uL (ref 3.87–5.11)
RDW: 12.6 % (ref 11.5–15.5)
WBC: 11.5 10*3/uL — ABNORMAL HIGH (ref 4.0–10.5)

## 2013-08-24 LAB — COMPREHENSIVE METABOLIC PANEL
ALT: 29 U/L (ref 0–35)
AST: 18 U/L (ref 0–37)
Albumin: 3.9 g/dL (ref 3.5–5.2)
Alkaline Phosphatase: 55 U/L (ref 39–117)
BUN: 13 mg/dL (ref 6–23)
CHLORIDE: 104 meq/L (ref 96–112)
CO2: 28 meq/L (ref 19–32)
Calcium: 9.4 mg/dL (ref 8.4–10.5)
Creatinine, Ser: 0.8 mg/dL (ref 0.50–1.10)
GFR calc Af Amer: >90 mL/min (ref >90)
Glucose, Bld: 110 mg/dL — ABNORMAL HIGH (ref 70–99)
POTASSIUM: 3.9 meq/L (ref 3.7–5.3)
Sodium: 144 mEq/L (ref 137–147)
Total Bilirubin: <0.2 mg/dL — ABNORMAL LOW (ref 0.3–1.2)
Total Protein: 7 g/dL (ref 6.0–8.3)

## 2013-08-24 LAB — URINALYSIS, ROUTINE W REFLEX MICROSCOPIC
BILIRUBIN URINE: NEGATIVE
Glucose, UA: NEGATIVE mg/dL
HGB URINE DIPSTICK: NEGATIVE
Ketones, ur: NEGATIVE mg/dL
Nitrite: NEGATIVE
Protein, ur: NEGATIVE mg/dL
SPECIFIC GRAVITY, URINE: 1.022 (ref 1.005–1.030)
UROBILINOGEN UA: 0.2 mg/dL (ref 0.0–1.0)
pH: 6 (ref 5.0–8.0)

## 2013-08-24 LAB — URINE MICROSCOPIC-ADD ON

## 2013-08-24 LAB — LIPASE, BLOOD: Lipase: 28 U/L (ref 11–59)

## 2013-08-24 LAB — PREGNANCY, URINE: Preg Test, Ur: NEGATIVE

## 2013-08-24 MED ORDER — GI COCKTAIL ~~LOC~~
30.0000 mL | Freq: Once | ORAL | Status: AC
  Administered 2013-08-24: 30 mL via ORAL
  Filled 2013-08-24: qty 30

## 2013-08-24 MED ORDER — MORPHINE SULFATE 4 MG/ML IJ SOLN
4.0000 mg | Freq: Once | INTRAMUSCULAR | Status: AC
  Administered 2013-08-24: 4 mg via INTRAVENOUS
  Filled 2013-08-24: qty 1

## 2013-08-24 NOTE — ED Notes (Signed)
Pt reports seen here at [facility] ED for same in recent past and was told she had stones in her gallbladder. Pt reports has appointment this up coming Thursday with surgeon to assess need for intervention.

## 2013-08-24 NOTE — ED Notes (Signed)
Abdominal pain. Epigastric pain that feels like air bubble per pt.
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
## 2013-08-25 MED ORDER — HYDROMORPHONE HCL PF 1 MG/ML IJ SOLN
1.0000 mg | Freq: Once | INTRAMUSCULAR | Status: AC
  Administered 2013-08-25: 1 mg via INTRAVENOUS
  Filled 2013-08-25: qty 1

## 2013-08-25 MED ORDER — OXYCODONE-ACETAMINOPHEN 5-325 MG PO TABS: 1.0000 | ORAL_TABLET | Freq: Four times a day (QID) | ORAL | Status: DC | PRN

## 2013-08-25 MED ORDER — OXYCODONE-ACETAMINOPHEN 5-325 MG PO TABS
ORAL_TABLET | ORAL | Status: AC
  Filled 2013-08-25: qty 2

## 2013-08-25 MED ORDER — KETOROLAC TROMETHAMINE 30 MG/ML IJ SOLN
30.0000 mg | Freq: Once | INTRAMUSCULAR | Status: AC
  Administered 2013-08-25: 30 mg via INTRAVENOUS
  Filled 2013-08-25: qty 1

## 2013-08-25 MED ORDER — GI COCKTAIL ~~LOC~~: 30.0000 mL | Freq: Once | ORAL | Status: DC

## 2013-08-25 NOTE — Discharge Instructions (Signed)

## 2013-09-04 ENCOUNTER — Ambulatory Visit (HOSPITAL_COMMUNITY): Payer: Self-pay | Admitting: Psychology

## 2013-09-06 ENCOUNTER — Encounter (HOSPITAL_BASED_OUTPATIENT_CLINIC_OR_DEPARTMENT_OTHER): Payer: Self-pay | Admitting: Emergency Medicine

## 2013-09-06 ENCOUNTER — Emergency Department (HOSPITAL_BASED_OUTPATIENT_CLINIC_OR_DEPARTMENT_OTHER)
Admission: EM | Admit: 2013-09-06 | Discharge: 2013-09-06 | Disposition: A | Discharge status: Home / Self Care | Payer: 59 | Attending: Emergency Medicine | Admitting: Emergency Medicine

## 2013-09-06 DIAGNOSIS — Z79899 Other long term (current) drug therapy: Secondary | ICD-10-CM | POA: Insufficient documentation

## 2013-09-06 DIAGNOSIS — R112 Nausea with vomiting, unspecified: Secondary | ICD-10-CM | POA: Insufficient documentation

## 2013-09-06 DIAGNOSIS — Z87440 Personal history of urinary (tract) infections: Secondary | ICD-10-CM | POA: Insufficient documentation

## 2013-09-06 DIAGNOSIS — R109 Unspecified abdominal pain: Secondary | ICD-10-CM

## 2013-09-06 DIAGNOSIS — F329 Major depressive disorder, single episode, unspecified: Secondary | ICD-10-CM | POA: Insufficient documentation

## 2013-09-06 DIAGNOSIS — Z87442 Personal history of urinary calculi: Secondary | ICD-10-CM | POA: Insufficient documentation

## 2013-09-06 DIAGNOSIS — F3289 Other specified depressive episodes: Secondary | ICD-10-CM | POA: Insufficient documentation

## 2013-09-06 DIAGNOSIS — Z880 Allergy status to penicillin: Secondary | ICD-10-CM | POA: Insufficient documentation

## 2013-09-06 DIAGNOSIS — R1012 Left upper quadrant pain: Secondary | ICD-10-CM | POA: Insufficient documentation

## 2013-09-06 DIAGNOSIS — Z8709 Personal history of other diseases of the respiratory system: Secondary | ICD-10-CM | POA: Insufficient documentation

## 2013-09-06 DIAGNOSIS — F411 Generalized anxiety disorder: Secondary | ICD-10-CM | POA: Insufficient documentation

## 2013-09-06 DIAGNOSIS — F17200 Nicotine dependence, unspecified, uncomplicated: Secondary | ICD-10-CM | POA: Insufficient documentation

## 2013-09-06 DIAGNOSIS — J45909 Unspecified asthma, uncomplicated: Secondary | ICD-10-CM | POA: Insufficient documentation

## 2013-09-06 DIAGNOSIS — Z8679 Personal history of other diseases of the circulatory system: Secondary | ICD-10-CM | POA: Insufficient documentation

## 2013-09-06 DIAGNOSIS — I10 Essential (primary) hypertension: Secondary | ICD-10-CM | POA: Insufficient documentation

## 2013-09-06 DIAGNOSIS — Z8719 Personal history of other diseases of the digestive system: Secondary | ICD-10-CM | POA: Insufficient documentation

## 2013-09-06 LAB — COMPREHENSIVE METABOLIC PANEL
ALT: 23 U/L (ref 0–35)
AST: 16 U/L (ref 0–37)
Albumin: 4 g/dL (ref 3.5–5.2)
Alkaline Phosphatase: 53 U/L (ref 39–117)
BUN: 15 mg/dL (ref 6–23)
CALCIUM: 9.8 mg/dL (ref 8.4–10.5)
CO2: 25 meq/L (ref 19–32)
CREATININE: 0.9 mg/dL (ref 0.50–1.10)
Chloride: 104 mEq/L (ref 96–112)
GFR, EST NON AFRICAN AMERICAN: 88 mL/min — AB (ref >90)
GLUCOSE: 101 mg/dL — AB (ref 70–99)
Potassium: 3.9 mEq/L (ref 3.7–5.3)
Sodium: 143 mEq/L (ref 137–147)
TOTAL PROTEIN: 7.4 g/dL (ref 6.0–8.3)
Total Bilirubin: <0.2 mg/dL — ABNORMAL LOW (ref 0.3–1.2)

## 2013-09-06 LAB — CBC WITH DIFFERENTIAL/PLATELET
Basophils Absolute: 0 10*3/uL (ref 0.0–0.1)
Basophils Relative: 0 % (ref 0–1)
EOS PCT: 2 % (ref 0–5)
Eosinophils Absolute: 0.2 10*3/uL (ref 0.0–0.7)
HEMATOCRIT: 41.1 % (ref 36.0–46.0)
Hemoglobin: 14.3 g/dL (ref 12.0–15.0)
LYMPHS ABS: 3.3 10*3/uL (ref 0.7–4.0)
Lymphocytes Relative: 29 % (ref 12–46)
MCH: 31.5 pg (ref 26.0–34.0)
MCHC: 34.8 g/dL (ref 30.0–36.0)
MCV: 90.5 fL (ref 78.0–100.0)
MONO ABS: 1.4 10*3/uL — AB (ref 0.1–1.0)
Monocytes Relative: 12 % (ref 3–12)
Neutro Abs: 6.7 10*3/uL (ref 1.7–7.7)
Neutrophils Relative %: 58 % (ref 43–77)
Platelets: 333 10*3/uL (ref 150–400)
RBC: 4.54 MIL/uL (ref 3.87–5.11)
RDW: 12.3 % (ref 11.5–15.5)
WBC: 11.7 10*3/uL — ABNORMAL HIGH (ref 4.0–10.5)

## 2013-09-06 LAB — LIPASE, BLOOD: LIPASE: 40 U/L (ref 11–59)

## 2013-09-06 MED ORDER — SODIUM CHLORIDE 0.9 % IV BOLUS (SEPSIS)
1000.0000 mL | Freq: Once | INTRAVENOUS | Status: AC
  Administered 2013-09-06: 1000 mL via INTRAVENOUS

## 2013-09-06 MED ORDER — ONDANSETRON HCL 4 MG PO TABS: 4.0000 mg | ORAL_TABLET | Freq: Four times a day (QID) | ORAL | Status: DC

## 2013-09-06 MED ORDER — HYDROCODONE-ACETAMINOPHEN 5-325 MG PO TABS: 1.0000 | ORAL_TABLET | ORAL | Status: DC | PRN

## 2013-09-06 MED ORDER — ONDANSETRON HCL 4 MG/2ML IJ SOLN
4.0000 mg | Freq: Once | INTRAMUSCULAR | Status: AC
  Administered 2013-09-06: 4 mg via INTRAVENOUS
  Filled 2013-09-06: qty 2

## 2013-09-06 MED ORDER — MORPHINE SULFATE 4 MG/ML IJ SOLN
4.0000 mg | Freq: Once | INTRAMUSCULAR | Status: AC
  Administered 2013-09-06: 4 mg via INTRAVENOUS
  Filled 2013-09-06: qty 1

## 2013-09-06 NOTE — ED Notes (Signed)
Pt reports RUQ pain that radiates into back that started this morning. Pt states she is aware that she has gallstones, and feels that this is a flare up.

## 2013-09-06 NOTE — ED Provider Notes (Signed)
CSN: [id number]     Arrival date & time 09/06/13  2123 History   First MD Initiated Contact with Patient 09/06/13 2243     Chief Complaint  Patient presents with  . Abdominal Pain     (Consider location/radiation/quality/duration/timing/severity/associated sxs/prior Treatment) Patient is a 28 y.o. female presenting with abdominal pain. The history is provided by the patient. No language interpreter was used.  Abdominal Pain Pain location:  RUQ Pain quality: cramping   Associated symptoms: nausea and vomiting   Associated symptoms: no chest pain, no chills, no dysuria, no fever and no shortness of breath   Associated symptoms comment:  Recurrent RUQ abdominal pain similar to previous episodes of gall stones. No fever. She reports vomiting non-bloody emesis. No diarrhea.    Past Medical History  Diagnosis Date  . Bronchitis   . Anxiety   . Hypertension   . Urinary tract infection   . Irritable bowel   . Depression   . Asthma   . Increased frequency of headaches   . Fatty liver disease, nonalcoholic   . Migraines   . Kidney stones   . Gall stones    History reviewed. No pertinent past surgical history. Family History  Problem Relation Age of Onset  . Physical abuse Mother   . Anxiety disorder Mother   . ADD / ADHD Mother   . Paranoid behavior Mother   . Drug abuse Father   . Depression Father   . Alcohol abuse Maternal Grandfather   . ADD / ADHD Maternal Grandmother   . Anxiety disorder Maternal Grandmother   . Dementia Maternal Grandmother   . OCD Maternal Grandmother   . Sexual abuse Maternal Grandmother   . Alcohol abuse Paternal Grandfather   . Bipolar disorder Cousin   . Schizophrenia Neg Hx   . Seizures Neg Hx    History  Substance Use Topics  . Smoking status: Current Every Day Smoker -- 1.00 packs/day    Types: Cigarettes  . Smokeless tobacco: Not on file  . Alcohol Use: No   OB History   Grav Para Term Preterm Abortions TAB SAB Ect Mult Living              Review of Systems  Constitutional: Negative for fever and chills.  Respiratory: Negative.  Negative for shortness of breath.   Cardiovascular: Negative.  Negative for chest pain.  Gastrointestinal: Positive for nausea, vomiting and abdominal pain.  Genitourinary: Negative for dysuria.  Musculoskeletal: Negative.   Skin: Negative.   Neurological: Negative.       Allergies  Sulfa antibiotics; Pseudoephedrine; Cephalosporins; Ciprocin-fluocin-procin; Hydrochlorothiazide; and Penicillins  Home Medications   Prior to Admission medications   Medication Sig Start Date End Date Taking? Authorizing Provider  ibuprofen (ADVIL,MOTRIN) 200 MG tablet Take 800 mg by mouth every 6 (six) hours as needed. Patient used this medication for pain.   Yes Historical Provider, MD  ALPRAZolam Prudy Feeler(XANAX) 1 MG tablet Take 1 tablet (1 mg total) by mouth 3 (three) times daily as needed for anxiety. 08/18/13 08/18/14  Diannia Rudereborah Ross, MD  HYDROcodone-acetaminophen (NORCO/VICODIN) 5-325 MG per tablet Take 2 tablets by mouth every 4 (four) hours as needed. 08/14/13   Elson AreasLeslie K Sofia, PA-C  ondansetron (ZOFRAN ODT) 4 MG disintegrating tablet Take 1 tablet (4 mg total) by mouth every 8 (eight) hours as needed for nausea. 08/14/13   Elson AreasLeslie K Sofia, PA-C  oxyCODONE-acetaminophen (PERCOCET) 5-325 MG per tablet Take 1 tablet by mouth every 6 (six) hours as needed.  08/25/13   April K Palumbo-Rasch, MD   BP 122/75  Pulse 83  Temp(Src) 98.1 F (36.7 C) (Oral)  Resp 24  Ht 5\' 5"  (1.651 m)  Wt 218 lb (98.884 kg)  BMI 36.28 kg/m2  SpO2 100%  LMP 09/06/2013 Physical Exam  Constitutional: She is oriented to person, place, and time. She appears well-developed and well-nourished.  HENT:  Head: Normocephalic.  Neck: Normal range of motion. Neck supple.  Cardiovascular: Normal rate and regular rhythm.   Pulmonary/Chest: Effort normal and breath sounds normal.  Abdominal: Soft. Bowel sounds are normal. There is no  tenderness. There is no rebound and no guarding.  Musculoskeletal: Normal range of motion.  Neurological: She is alert and oriented to person, place, and time.  Skin: Skin is warm and dry. No rash noted.  Psychiatric: She has a normal mood and affect.    ED Course  Procedures (including critical care time) Labs Review Labs Reviewed  CBC WITH DIFFERENTIAL - Abnormal; Notable for the following:    WBC 11.7 (*)    Monocytes Absolute 1.4 (*)    All other components within normal limits  COMPREHENSIVE METABOLIC PANEL - Abnormal; Notable for the following:    Glucose, Bld 101 (*)    Total Bilirubin <0.2 (*)    GFR calc non Af Amer 88 (*)    All other components within normal limits  LIPASE, BLOOD    Imaging Review No results found.   EKG Interpretation None      MDM   Final diagnoses:  None    1. ruq abdominal pain 2. History of gall stones  No significant abnormalities in lab studies. Afebrile illness. Pain is resolved with pain medications. Stable for discharge. Encouraged returning to surgeon for elective cholecystectomy.    Arnoldo HookerShari A Kyley Solow, PA-C 09/06/13 2343

## 2013-09-06 NOTE — Discharge Instructions (Signed)
Abdominal Pain, Women °Abdominal (stomach, pelvic, or belly) pain can be caused by many things. It is important to tell your doctor: °· The location of the pain. °· Does it come and go or is it present all the time? °· Are there things that start the pain (eating certain foods, exercise)? °· Are there other symptoms associated with the pain (fever, nausea, vomiting, diarrhea)? °All of this is helpful to know when trying to find the cause of the pain. °CAUSES  °· Stomach: virus or bacteria infection, or ulcer. °· Intestine: appendicitis (inflamed appendix), regional ileitis (Crohn's disease), ulcerative colitis (inflamed colon), irritable bowel syndrome, diverticulitis (inflamed diverticulum of the colon), or cancer of the stomach or intestine. °· Gallbladder disease or stones in the gallbladder. °· Kidney disease, kidney stones, or infection. °· Pancreas infection or cancer. °· Fibromyalgia (pain disorder). °· Diseases of the female organs: °· Uterus: fibroid (non-cancerous) tumors or infection. °· Fallopian tubes: infection or tubal pregnancy. °· Ovary: cysts or tumors. °· Pelvic adhesions (scar tissue). °· Endometriosis (uterus lining tissue growing in the pelvis and on the pelvic organs). °· Pelvic congestion syndrome (female organs filling up with blood just before the menstrual period). °· Pain with the menstrual period. °· Pain with ovulation (producing an egg). °· Pain with an IUD (intrauterine device, birth control) in the uterus. °· Cancer of the female organs. °· Functional pain (pain not caused by a disease, may improve without treatment). °· Psychological pain. °· Depression. °DIAGNOSIS  °Your doctor will decide the seriousness of your pain by doing an examination. °· Blood tests. °· X-rays. °· Ultrasound. °· CT scan (computed tomography, special type of X-ray). °· MRI (magnetic resonance imaging). °· Cultures, for infection. °· Barium enema (dye inserted in the large intestine, to better view it with  X-rays). °· Colonoscopy (looking in intestine with a lighted tube). °· Laparoscopy (minor surgery, looking in abdomen with a lighted tube). °· Major abdominal exploratory surgery (looking in abdomen with a large incision). °TREATMENT  °The treatment will depend on the cause of the pain.  °· Many cases can be observed and treated at home. °· Over-the-counter medicines recommended by your caregiver. °· Prescription medicine. °· Antibiotics, for infection. °· Birth control pills, for painful periods or for ovulation pain. °· Hormone treatment, for endometriosis. °· Nerve blocking injections. °· Physical therapy. °· Antidepressants. °· Counseling with a psychologist or psychiatrist. °· Minor or major surgery. °HOME CARE INSTRUCTIONS  °· Do not take laxatives, unless directed by your caregiver. °· Take over-the-counter pain medicine only if ordered by your caregiver. Do not take aspirin because it can cause an upset stomach or bleeding. °· Try a clear liquid diet (broth or water) as ordered by your caregiver. Slowly move to a bland diet, as tolerated, if the pain is related to the stomach or intestine. °· Have a thermometer and take your temperature several times a day, and record it. °· Bed rest and sleep, if it helps the pain. °· Avoid sexual intercourse, if it causes pain. °· Avoid stressful situations. °· Keep your follow-up appointments and tests, as your caregiver orders. °· If the pain does not go away with medicine or surgery, you may try: °· Acupuncture. °· Relaxation exercises (yoga, meditation). °· Group therapy. °· Counseling. °SEEK MEDICAL CARE IF:  °· You notice certain foods cause stomach pain. °· Your home care treatment is not helping your pain. °· You need stronger pain medicine. °· You want your IUD removed. °· You feel faint or   lightheaded. °· You develop nausea and vomiting. °· You develop a rash. °· You are having side effects or an allergy to your medicine. °SEEK IMMEDIATE MEDICAL CARE IF:  °· Your  pain does not go away or gets worse. °· You have a fever. °· Your pain is felt only in portions of the abdomen. The right side could possibly be appendicitis. The left lower portion of the abdomen could be colitis or diverticulitis. °· You are passing blood in your stools (bright red or black tarry stools, with or without vomiting). °· You have blood in your urine. °· You develop chills, with or without a fever. °· You pass out. °MAKE SURE YOU:  °· Understand these instructions. °· Will watch your condition. °· Will get help right away if you are not doing well or get worse. °Document Released: 02/11/2007 Document Revised: 07/09/2011 Document Reviewed: 03/03/2009 °[employer]® Patient Information ©2014 [employer]. ° °

## 2013-09-08 NOTE — ED Provider Notes (Signed)
Medical screening examination/treatment/procedure(s) were performed by non-physician practitioner and as supervising physician I was immediately available for consultation/collaboration.   EKG Interpretation None        Carol B. Bernette MayersSheldon, MD 09/08/13 1106

## 2013-09-23 ENCOUNTER — Encounter (HOSPITAL_COMMUNITY): Payer: Self-pay | Admitting: Emergency Medicine

## 2013-09-23 ENCOUNTER — Emergency Department (HOSPITAL_COMMUNITY)
Admission: EM | Admit: 2013-09-23 | Discharge: 2013-09-23 | Disposition: A | Discharge status: Home / Self Care | Payer: 59 | Attending: Emergency Medicine | Admitting: Emergency Medicine

## 2013-09-23 DIAGNOSIS — Z3202 Encounter for pregnancy test, result negative: Secondary | ICD-10-CM | POA: Insufficient documentation

## 2013-09-23 DIAGNOSIS — Z8659 Personal history of other mental and behavioral disorders: Secondary | ICD-10-CM | POA: Insufficient documentation

## 2013-09-23 DIAGNOSIS — K8050 Calculus of bile duct without cholangitis or cholecystitis without obstruction: Secondary | ICD-10-CM

## 2013-09-23 DIAGNOSIS — Z87442 Personal history of urinary calculi: Secondary | ICD-10-CM | POA: Insufficient documentation

## 2013-09-23 DIAGNOSIS — Z87440 Personal history of urinary (tract) infections: Secondary | ICD-10-CM | POA: Insufficient documentation

## 2013-09-23 DIAGNOSIS — J45909 Unspecified asthma, uncomplicated: Secondary | ICD-10-CM | POA: Insufficient documentation

## 2013-09-23 DIAGNOSIS — K8020 Calculus of gallbladder without cholecystitis without obstruction: Secondary | ICD-10-CM | POA: Insufficient documentation

## 2013-09-23 DIAGNOSIS — Z880 Allergy status to penicillin: Secondary | ICD-10-CM | POA: Insufficient documentation

## 2013-09-23 DIAGNOSIS — F17200 Nicotine dependence, unspecified, uncomplicated: Secondary | ICD-10-CM | POA: Insufficient documentation

## 2013-09-23 DIAGNOSIS — I10 Essential (primary) hypertension: Secondary | ICD-10-CM | POA: Insufficient documentation

## 2013-09-23 LAB — COMPREHENSIVE METABOLIC PANEL
ALK PHOS: 61 U/L (ref 39–117)
ALT: 24 U/L (ref 0–35)
AST: 17 U/L (ref 0–37)
Albumin: 3.8 g/dL (ref 3.5–5.2)
BUN: 10 mg/dL (ref 6–23)
CO2: 28 meq/L (ref 19–32)
Calcium: 9.1 mg/dL (ref 8.4–10.5)
Chloride: 101 mEq/L (ref 96–112)
Creatinine, Ser: 0.74 mg/dL (ref 0.50–1.10)
GLUCOSE: 65 mg/dL — AB (ref 70–99)
POTASSIUM: 4 meq/L (ref 3.7–5.3)
Sodium: 141 mEq/L (ref 137–147)
Total Bilirubin: 0.2 mg/dL — ABNORMAL LOW (ref 0.3–1.2)
Total Protein: 7.1 g/dL (ref 6.0–8.3)

## 2013-09-23 LAB — CBC WITH DIFFERENTIAL/PLATELET
Basophils Absolute: 0 10*3/uL (ref 0.0–0.1)
Basophils Relative: 0 % (ref 0–1)
EOS ABS: 0.1 10*3/uL (ref 0.0–0.7)
EOS PCT: 1 % (ref 0–5)
HCT: 41.7 % (ref 36.0–46.0)
Hemoglobin: 14.4 g/dL (ref 12.0–15.0)
LYMPHS ABS: 3.5 10*3/uL (ref 0.7–4.0)
LYMPHS PCT: 27 % (ref 12–46)
MCH: 30.4 pg (ref 26.0–34.0)
MCHC: 34.5 g/dL (ref 30.0–36.0)
MCV: 88.2 fL (ref 78.0–100.0)
Monocytes Absolute: 1.5 10*3/uL — ABNORMAL HIGH (ref 0.1–1.0)
Monocytes Relative: 11 % (ref 3–12)
NEUTROS PCT: 61 % (ref 43–77)
Neutro Abs: 8 10*3/uL — ABNORMAL HIGH (ref 1.7–7.7)
Platelets: 322 10*3/uL (ref 150–400)
RBC: 4.73 MIL/uL (ref 3.87–5.11)
RDW: 12 % (ref 11.5–15.5)
WBC: 13.1 10*3/uL — ABNORMAL HIGH (ref 4.0–10.5)

## 2013-09-23 LAB — URINE MICROSCOPIC-ADD ON

## 2013-09-23 LAB — URINALYSIS, ROUTINE W REFLEX MICROSCOPIC
BILIRUBIN URINE: NEGATIVE
GLUCOSE, UA: NEGATIVE mg/dL
HGB URINE DIPSTICK: NEGATIVE
KETONES UR: NEGATIVE mg/dL
Nitrite: NEGATIVE
PROTEIN: NEGATIVE mg/dL
Specific Gravity, Urine: 1.016 (ref 1.005–1.030)
UROBILINOGEN UA: 0.2 mg/dL (ref 0.0–1.0)
pH: 7 (ref 5.0–8.0)

## 2013-09-23 LAB — POC URINE PREG, ED: Preg Test, Ur: NEGATIVE

## 2013-09-23 LAB — LIPASE, BLOOD: LIPASE: 24 U/L (ref 11–59)

## 2013-09-23 MED ORDER — MORPHINE SULFATE 4 MG/ML IJ SOLN
4.0000 mg | INTRAMUSCULAR | Status: DC | PRN
  Administered 2013-09-23 (×2): 4 mg via INTRAVENOUS
  Filled 2013-09-23 (×2): qty 1

## 2013-09-23 MED ORDER — ONDANSETRON HCL 4 MG/2ML IJ SOLN
4.0000 mg | Freq: Once | INTRAMUSCULAR | Status: AC
  Administered 2013-09-23: 4 mg via INTRAVENOUS
  Filled 2013-09-23: qty 2

## 2013-09-23 MED ORDER — ONDANSETRON 4 MG PO TBDP: 4.0000 mg | ORAL_TABLET | Freq: Three times a day (TID) | ORAL | Status: DC | PRN

## 2013-09-23 MED ORDER — HYDROCODONE-ACETAMINOPHEN 5-325 MG PO TABS: 2.0000 | ORAL_TABLET | ORAL | Status: DC | PRN

## 2013-09-23 NOTE — ED Provider Notes (Signed)
CSN: [id number]     Arrival date & time 09/23/13  1803 History   First MD Initiated Contact with Patient 09/23/13 2002     Chief Complaint  Patient presents with  . Chest Pain     HPI  Patient presents with viral quadrant pain. A big meal tonight. His episodes of biliary colic. Has known gallstones. Nausea. No vomiting. States it happens rather frequently. Said she saw surgeon in refill. At that time was told that her blood tests were normal" didn't have to be taken out right now.". Has continued to have episodes similar to tonight.  Past Medical History  Diagnosis Date  . Bronchitis   . Anxiety   . Hypertension   . Urinary tract infection   . Irritable bowel   . Depression   . Asthma   . Increased frequency of headaches   . Fatty liver disease, nonalcoholic   . Migraines   . Kidney stones   . Gall stones    History reviewed. No pertinent past surgical history. Family History  Problem Relation Age of Onset  . Physical abuse Mother   . Anxiety disorder Mother   . ADD / ADHD Mother   . Paranoid behavior Mother   . Drug abuse Father   . Depression Father   . Alcohol abuse Maternal Grandfather   . ADD / ADHD Maternal Grandmother   . Anxiety disorder Maternal Grandmother   . Dementia Maternal Grandmother   . OCD Maternal Grandmother   . Sexual abuse Maternal Grandmother   . Alcohol abuse Paternal Grandfather   . Bipolar disorder Cousin   . Schizophrenia Neg Hx   . Seizures Neg Hx    History  Substance Use Topics  . Smoking status: Current Every Day Smoker -- 1.00 packs/day    Types: Cigarettes  . Smokeless tobacco: Not on file  . Alcohol Use: No   OB History   Grav Para Term Preterm Abortions TAB SAB Ect Mult Living                 Review of Systems  Constitutional: Negative for fever, chills, diaphoresis, appetite change and fatigue.  HENT: Negative for mouth sores, sore throat and trouble swallowing.   Eyes: Negative for visual disturbance.  Respiratory:  Negative for cough, chest tightness, shortness of breath and wheezing.   Cardiovascular: Negative for chest pain.  Gastrointestinal: Positive for nausea, abdominal pain and abdominal distention. Negative for vomiting and diarrhea.  Endocrine: Negative for polydipsia, polyphagia and polyuria.  Genitourinary: Negative for dysuria, frequency and hematuria.  Musculoskeletal: Negative for gait problem.  Skin: Negative for color change, pallor and rash.  Neurological: Negative for dizziness, syncope, light-headedness and headaches.  Hematological: Does not bruise/bleed easily.  Psychiatric/Behavioral: Negative for behavioral problems and confusion.      Allergies  Sulfa antibiotics; Pseudoephedrine; Cephalosporins; Ciprocin-fluocin-procin; Ciprofloxacin hcl; Hydrochlorothiazide; and Penicillins  Home Medications   Prior to Admission medications   Medication Sig Start Date End Date Taking? Authorizing Provider  diphenhydrAMINE (BENADRYL) 25 mg capsule Take 25 mg by mouth every 8 (eight) hours as needed for allergies.   Yes Historical Provider, MD  HYDROcodone-acetaminophen (NORCO/VICODIN) 5-325 MG per tablet Take 1-2 tablets by mouth every 4 (four) hours as needed. 09/06/13  Yes Shari A Upstill, PA-C  HYDROcodone-acetaminophen (NORCO/VICODIN) 5-325 MG per tablet Take 2 tablets by mouth every 4 (four) hours as needed. 09/23/13   Rolland PorterMark Renezmae Canlas, MD  ondansetron (ZOFRAN ODT) 4 MG disintegrating tablet Take 1 tablet (4 mg  total) by mouth every 8 (eight) hours as needed for nausea. 09/23/13   Rolland Porter, MD   BP 135/82  Pulse 101  Temp(Src) 99.3 F (37.4 C) (Oral)  Resp 20  SpO2 97%  LMP 09/06/2013 Physical Exam  Constitutional: She is oriented to person, place, and time. She appears well-developed and well-nourished. No distress.  HENT:  Head: Normocephalic.  Eyes: Conjunctivae are normal. Pupils are equal, round, and reactive to light. No scleral icterus.  Neck: Normal range of motion. Neck  supple. No thyromegaly present.  Cardiovascular: Normal rate and regular rhythm.  Exam reveals no gallop and no friction rub.   No murmur heard. Pulmonary/Chest: Effort normal and breath sounds normal. No respiratory distress. She has no wheezes. She has no rales.  Abdominal: Soft. Bowel sounds are normal. She exhibits no distension. There is no tenderness. There is no rebound.    Mild right upper quadrant tenderness without peritoneal irritation  Musculoskeletal: Normal range of motion.  Neurological: She is alert and oriented to person, place, and time.  Skin: Skin is warm and dry. No rash noted.  Psychiatric: She has a normal mood and affect. Her behavior is normal.    ED Course  Procedures (including critical care time) Labs Review Labs Reviewed  CBC WITH DIFFERENTIAL - Abnormal; Notable for the following:    WBC 13.1 (*)    Neutro Abs 8.0 (*)    Monocytes Absolute 1.5 (*)    All other components within normal limits  COMPREHENSIVE METABOLIC PANEL - Abnormal; Notable for the following:    Glucose, Bld 65 (*)    Total Bilirubin 0.2 (*)    All other components within normal limits  URINALYSIS, ROUTINE W REFLEX MICROSCOPIC - Abnormal; Notable for the following:    APPearance CLOUDY (*)    Leukocytes, UA LARGE (*)    All other components within normal limits  URINE MICROSCOPIC-ADD ON - Abnormal; Notable for the following:    Bacteria, UA FEW (*)    All other components within normal limits  LIPASE, BLOOD  POC URINE PREG, ED    Imaging Review No results found.   EKG Interpretation   Date/Time:  Wednesday Sep 23 2013 18:41:15 EDT Ventricular Rate:  97 PR Interval:  140 QRS Duration: 94 QT Interval:  350 QTC Calculation: 445 R Axis:   55 Text Interpretation:  Sinus rhythm Baseline wander in lead(s) V6 Confirmed  by HORTON  MD, Toni Amend ([id number]) on 09/23/2013 6:44:38 PM      MDM   Final diagnoses:  Biliary colic   Patient is an episode of 13,000. Normal  hepatobiliary enzymes. Good pain control after meds. He did encourage her to seek surgical followup. Has been referred to a local surgeon in [place]. Given her [place] surgery for a second opinion here. Ultimately she'll have to see a surgeon as an outpatient and have an outpatient cholecystectomy. No signs of acute cholecystitis tonight.    Rolland Porter, MD 09/23/13 2251

## 2013-09-23 NOTE — Discharge Instructions (Signed)
Call surgeon for outpatient appointment to remove your gallbladder. These episodes will continue to happen until you have your gallbladder removed.  Biliary Colic  Biliary colic is a steady or irregular pain in the upper abdomen. It is usually under the right side of the rib cage. It happens when gallstones interfere with the normal flow of bile from the gallbladder. Bile is a liquid that helps to digest fats. Bile is made in the liver and stored in the gallbladder. When you eat a meal, bile passes from the gallbladder through the cystic duct and the common bile duct into the small intestine. There, it mixes with partially digested food. If a gallstone blocks either of these ducts, the normal flow of bile is blocked. The muscle cells in the bile duct contract forcefully to try to move the stone. This causes the pain of biliary colic.  SYMPTOMS   A person with biliary colic usually complains of pain in the upper abdomen. This pain can be:  In the center of the upper abdomen just below the breastbone.  In the upper-right part of the abdomen, near the gallbladder and liver.  Spread back toward the right shoulder blade.  Nausea and vomiting.  The pain usually occurs after eating.  Biliary colic is usually triggered by the digestive system's demand for bile. The demand for bile is high after fatty meals. Symptoms can also occur when a person who has been fasting suddenly eats a very large meal. Most episodes of biliary colic pass after 1 to 5 hours. After the most intense pain passes, your abdomen may continue to ache mildly for about 24 hours. DIAGNOSIS  After you describe your symptoms, your caregiver will perform a physical exam. He or she will pay attention to the upper right portion of your belly (abdomen). This is the area of your liver and gallbladder. An ultrasound will help your caregiver look for gallstones. Specialized scans of the gallbladder may also be done. Blood tests may be done,  especially if you have fever or if your pain persists. PREVENTION  Biliary colic can be prevented by controlling the risk factors for gallstones. Some of these risk factors, such as heredity, increasing age, and pregnancy are a normal part of life. Obesity and a high-fat diet are risk factors you can change through a healthy lifestyle. Women going through menopause who take hormone replacement therapy (estrogen) are also more likely to develop biliary colic. TREATMENT   Pain medication may be prescribed.  You may be encouraged to eat a fat-free diet.  If the first episode of biliary colic is severe, or episodes of colic keep retuning, surgery to remove the gallbladder (cholecystectomy) is usually recommended. This procedure can be done through small incisions using an instrument called a laparoscope. The procedure often requires a brief stay in the hospital. Some people can leave the hospital the same day. It is the most widely used treatment in people troubled by painful gallstones. It is effective and safe, with no complications in more than 90% of cases.  If surgery cannot be done, medication that dissolves gallstones may be used. This medication is expensive and can take months or years to work. Only small stones will dissolve.  Rarely, medication to dissolve gallstones is combined with a procedure called shock-wave lithotripsy. This procedure uses carefully aimed shock waves to break up gallstones. In many people treated with this procedure, gallstones form again within a few years. PROGNOSIS  If gallstones block your cystic duct or common  bile duct, you are at risk for repeated episodes of biliary colic. There is also a 25% chance that you will develop a gallbladder infection(acute cholecystitis), or some other complication of gallstones within 10 to 20 years. If you have surgery, schedule it at a time that is convenient for you and at a time when you are not sick. HOME CARE INSTRUCTIONS    Drink plenty of clear fluids.  Avoid fatty, greasy or fried foods, or any foods that make your pain worse.  Take medications as directed. SEEK MEDICAL CARE IF:   You develop a fever over 100.5 F (38.1 C).  Your pain gets worse over time.  You develop nausea that prevents you from eating and drinking.  You develop vomiting. SEEK IMMEDIATE MEDICAL CARE IF:   You have continuous or severe belly (abdominal) pain which is not relieved with medications.  You develop nausea and vomiting which is not relieved with medications.  You have symptoms of biliary colic and you suddenly develop a fever and shaking chills. This may signal cholecystitis. Call your caregiver immediately.  You develop a yellow color to your skin or the white part of your eyes (jaundice). Document Released: 09/17/2005 Document Revised: 07/09/2011 Document Reviewed: 11/27/2007 [facility] Patient Information 2014 [place], [place].

## 2013-09-23 NOTE — ED Notes (Signed)
Pt on cardiac monitor.

## 2013-09-23 NOTE — ED Notes (Signed)
Pt states started having mid sternal chest pain radiating to back and shoulder blades after eating dinner a little after 5pm, pt states has a hx of gallstones and this is what it felt like, states chest pain feels worse though, states chest pain is pressure feeling.

## 2013-09-23 NOTE — ED Notes (Signed)
Pt reports onset mid chest pain radiating into back and shoulder blades, N/V, SOB, and lightheadedness about 1 hour ago. Pt reports sx are similar to when she has had gallstones in the past.

## 2013-10-01 ENCOUNTER — Encounter (HOSPITAL_COMMUNITY): Payer: Self-pay | Admitting: Psychiatry

## 2013-10-01 ENCOUNTER — Ambulatory Visit (INDEPENDENT_AMBULATORY_CARE_PROVIDER_SITE_OTHER): Payer: 59 | Admitting: Psychiatry

## 2013-10-01 VITALS — BP 100/60 | Ht 67.0 in | Wt 227.0 lb

## 2013-10-01 DIAGNOSIS — F909 Attention-deficit hyperactivity disorder, unspecified type: Secondary | ICD-10-CM

## 2013-10-01 DIAGNOSIS — F411 Generalized anxiety disorder: Secondary | ICD-10-CM

## 2013-10-01 MED ORDER — ALPRAZOLAM 1 MG PO TABS: 1.0000 mg | ORAL_TABLET | Freq: Three times a day (TID) | ORAL | Status: DC | PRN

## 2013-10-01 MED ORDER — BUPROPION HCL ER (XL) 150 MG PO TB24: 150.0000 mg | ORAL_TABLET | ORAL | Status: DC
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
## 2013-10-16 ENCOUNTER — Ambulatory Visit (HOSPITAL_COMMUNITY): Payer: Self-pay | Admitting: Psychiatry

## 2013-10-20 ENCOUNTER — Telehealth (HOSPITAL_COMMUNITY): Payer: Self-pay | Admitting: *Deleted

## 2013-10-20 ENCOUNTER — Encounter (HOSPITAL_COMMUNITY): Payer: Self-pay | Admitting: Psychiatry

## 2013-10-20 NOTE — Telephone Encounter (Signed)
done

## 2013-10-22 ENCOUNTER — Ambulatory Visit (HOSPITAL_COMMUNITY): Payer: Self-pay | Admitting: Psychology

## 2013-10-29 ENCOUNTER — Ambulatory Visit (INDEPENDENT_AMBULATORY_CARE_PROVIDER_SITE_OTHER): Payer: 59 | Admitting: Psychiatry

## 2013-10-29 ENCOUNTER — Ambulatory Visit (HOSPITAL_COMMUNITY): Payer: Self-pay | Admitting: Psychiatry

## 2013-10-29 ENCOUNTER — Encounter (HOSPITAL_COMMUNITY): Payer: Self-pay | Admitting: Psychiatry

## 2013-10-29 VITALS — BP 100/80 | Ht 67.0 in | Wt 223.0 lb

## 2013-10-29 DIAGNOSIS — F411 Generalized anxiety disorder: Secondary | ICD-10-CM

## 2013-10-29 DIAGNOSIS — F909 Attention-deficit hyperactivity disorder, unspecified type: Secondary | ICD-10-CM

## 2013-10-29 MED ORDER — ALPRAZOLAM 1 MG PO TABS: 1.0000 mg | ORAL_TABLET | Freq: Three times a day (TID) | ORAL | Status: DC | PRN
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
## 2013-11-01 ENCOUNTER — Encounter (HOSPITAL_COMMUNITY): Payer: Self-pay

## 2013-11-01 ENCOUNTER — Emergency Department (HOSPITAL_BASED_OUTPATIENT_CLINIC_OR_DEPARTMENT_OTHER)
Admission: EM | Admit: 2013-11-01 | Discharge: 2013-11-01 | Disposition: A | Discharge status: Other Acute Inpatient Hospital | Payer: 59 | Attending: Emergency Medicine | Admitting: Emergency Medicine

## 2013-11-01 ENCOUNTER — Encounter (HOSPITAL_BASED_OUTPATIENT_CLINIC_OR_DEPARTMENT_OTHER): Payer: Self-pay | Admitting: Emergency Medicine

## 2013-11-01 ENCOUNTER — Observation Stay (HOSPITAL_COMMUNITY)
Admission: EM | Admit: 2013-11-01 | Discharge: 2013-11-02 | Disposition: A | Discharge status: Home / Self Care | Payer: 59 | Source: Intra-hospital | Attending: Psychiatry | Admitting: Psychiatry

## 2013-11-01 DIAGNOSIS — F17200 Nicotine dependence, unspecified, uncomplicated: Secondary | ICD-10-CM | POA: Insufficient documentation

## 2013-11-01 DIAGNOSIS — Z23 Encounter for immunization: Secondary | ICD-10-CM | POA: Insufficient documentation

## 2013-11-01 DIAGNOSIS — Z880 Allergy status to penicillin: Secondary | ICD-10-CM | POA: Insufficient documentation

## 2013-11-01 DIAGNOSIS — Z87440 Personal history of urinary (tract) infections: Secondary | ICD-10-CM | POA: Insufficient documentation

## 2013-11-01 DIAGNOSIS — R443 Hallucinations, unspecified: Secondary | ICD-10-CM | POA: Insufficient documentation

## 2013-11-01 DIAGNOSIS — F332 Major depressive disorder, recurrent severe without psychotic features: Principal | ICD-10-CM | POA: Diagnosis present

## 2013-11-01 DIAGNOSIS — Z8719 Personal history of other diseases of the digestive system: Secondary | ICD-10-CM | POA: Insufficient documentation

## 2013-11-01 DIAGNOSIS — G43909 Migraine, unspecified, not intractable, without status migrainosus: Secondary | ICD-10-CM | POA: Insufficient documentation

## 2013-11-01 DIAGNOSIS — F329 Major depressive disorder, single episode, unspecified: Secondary | ICD-10-CM | POA: Insufficient documentation

## 2013-11-01 DIAGNOSIS — I10 Essential (primary) hypertension: Secondary | ICD-10-CM | POA: Insufficient documentation

## 2013-11-01 DIAGNOSIS — Z79899 Other long term (current) drug therapy: Secondary | ICD-10-CM | POA: Insufficient documentation

## 2013-11-01 DIAGNOSIS — F909 Attention-deficit hyperactivity disorder, unspecified type: Secondary | ICD-10-CM | POA: Insufficient documentation

## 2013-11-01 DIAGNOSIS — J45909 Unspecified asthma, uncomplicated: Secondary | ICD-10-CM | POA: Insufficient documentation

## 2013-11-01 DIAGNOSIS — R45851 Suicidal ideations: Secondary | ICD-10-CM | POA: Insufficient documentation

## 2013-11-01 DIAGNOSIS — F411 Generalized anxiety disorder: Secondary | ICD-10-CM | POA: Insufficient documentation

## 2013-11-01 DIAGNOSIS — F3289 Other specified depressive episodes: Secondary | ICD-10-CM | POA: Insufficient documentation

## 2013-11-01 DIAGNOSIS — F32A Depression, unspecified: Secondary | ICD-10-CM

## 2013-11-01 DIAGNOSIS — Z87442 Personal history of urinary calculi: Secondary | ICD-10-CM | POA: Insufficient documentation

## 2013-11-01 LAB — COMPREHENSIVE METABOLIC PANEL
ALBUMIN: 4.6 g/dL (ref 3.5–5.2)
ALK PHOS: 60 U/L (ref 39–117)
ALT: 26 U/L (ref 0–35)
AST: 19 U/L (ref 0–37)
Anion gap: 16 — ABNORMAL HIGH (ref 5–15)
BUN: 8 mg/dL (ref 6–23)
CHLORIDE: 103 meq/L (ref 96–112)
CO2: 24 meq/L (ref 19–32)
Calcium: 9.9 mg/dL (ref 8.4–10.5)
Creatinine, Ser: 0.8 mg/dL (ref 0.50–1.10)
GFR calc Af Amer: >90 mL/min (ref >90)
Glucose, Bld: 101 mg/dL — ABNORMAL HIGH (ref 70–99)
POTASSIUM: 3.9 meq/L (ref 3.7–5.3)
Sodium: 143 mEq/L (ref 137–147)
Total Bilirubin: 0.4 mg/dL (ref 0.3–1.2)
Total Protein: 8.3 g/dL (ref 6.0–8.3)

## 2013-11-01 LAB — CBC
HCT: 43.9 % (ref 36.0–46.0)
Hemoglobin: 15.3 g/dL — ABNORMAL HIGH (ref 12.0–15.0)
MCH: 30.9 pg (ref 26.0–34.0)
MCHC: 34.9 g/dL (ref 30.0–36.0)
MCV: 88.7 fL (ref 78.0–100.0)
PLATELETS: 335 10*3/uL (ref 150–400)
RBC: 4.95 MIL/uL (ref 3.87–5.11)
RDW: 12.2 % (ref 11.5–15.5)
WBC: 11.3 10*3/uL — AB (ref 4.0–10.5)

## 2013-11-01 LAB — ACETAMINOPHEN LEVEL

## 2013-11-01 LAB — SALICYLATE LEVEL: Salicylate Lvl: <2 mg/dL — ABNORMAL LOW (ref 2.8–20.0)

## 2013-11-01 LAB — ETHANOL: Alcohol, Ethyl (B): <11 mg/dL (ref 0–11)

## 2013-11-01 MED ORDER — ONDANSETRON 4 MG PO TBDP
4.0000 mg | ORAL_TABLET | Freq: Three times a day (TID) | ORAL | Status: DC | PRN
  Administered 2013-11-01 – 2013-11-02 (×2): 4 mg via ORAL
  Filled 2013-11-01 (×2): qty 1

## 2013-11-01 MED ORDER — TETANUS-DIPHTH-ACELL PERTUSSIS 5-2.5-18.5 LF-MCG/0.5 IM SUSP
0.5000 mL | Freq: Once | INTRAMUSCULAR | Status: AC
  Administered 2013-11-01: 0.5 mL via INTRAMUSCULAR
  Filled 2013-11-01: qty 0.5

## 2013-11-01 MED ORDER — ALPRAZOLAM 1 MG PO TABS
1.0000 mg | ORAL_TABLET | Freq: Three times a day (TID) | ORAL | Status: DC | PRN
  Administered 2013-11-01 – 2013-11-02 (×3): 1 mg via ORAL
  Filled 2013-11-01 (×3): qty 1

## 2013-11-01 MED ORDER — MAGNESIUM HYDROXIDE 400 MG/5ML PO SUSP: 30.0000 mL | Freq: Every day | ORAL | Status: DC | PRN

## 2013-11-01 MED ORDER — ONDANSETRON HCL 8 MG PO TABS: 4.0000 mg | ORAL_TABLET | Freq: Three times a day (TID) | ORAL | Status: DC | PRN

## 2013-11-01 MED ORDER — BUPROPION HCL ER (XL) 150 MG PO TB24
150.0000 mg | ORAL_TABLET | Freq: Every day | ORAL | Status: DC
  Administered 2013-11-02: 150 mg via ORAL
  Filled 2013-11-01 (×3): qty 1

## 2013-11-01 MED ORDER — IBUPROFEN 400 MG PO TABS: 600.0000 mg | ORAL_TABLET | Freq: Three times a day (TID) | ORAL | Status: DC | PRN

## 2013-11-01 MED ORDER — ACETAMINOPHEN 325 MG PO TABS
650.0000 mg | ORAL_TABLET | Freq: Four times a day (QID) | ORAL | Status: DC | PRN
  Administered 2013-11-01: 650 mg via ORAL
  Filled 2013-11-01: qty 2

## 2013-11-01 MED ORDER — ACETAMINOPHEN 325 MG PO TABS
650.0000 mg | ORAL_TABLET | Freq: Once | ORAL | Status: AC
  Administered 2013-11-01: 650 mg via ORAL
  Filled 2013-11-01: qty 2

## 2013-11-01 MED ORDER — SODIUM CHLORIDE 0.9 % IV BOLUS (SEPSIS)
1000.0000 mL | Freq: Once | INTRAVENOUS | Status: AC
  Administered 2013-11-01: 1000 mL via INTRAVENOUS

## 2013-11-01 MED ORDER — ALUM & MAG HYDROXIDE-SIMETH 200-200-20 MG/5ML PO SUSP: 30.0000 mL | ORAL | Status: DC | PRN

## 2013-11-01 NOTE — Progress Notes (Signed)
[facility] INPATIENT:  Family/Significant Other Suicide Prevention Education  Suicide Prevention Education:  Education Completed;, 11/01/2013 (Mother / Horton Finerina Procter /[phone]) has been identified by the patient as the family member/significant other with whom the patient will be residing, and identified as the person(s) who will aid the patient in the event of a mental health crisis (suicidal ideations/suicide attempt).  With written consent from the patient, the family member/significant other has been provided the following suicide prevention education, prior to the and/or following the discharge of the patient.  The suicide prevention education provided includes the following:  Suicide risk factors  Suicide prevention and interventions  National Suicide Hotline telephone number  [facility] assessment telephone number  [facility] Emergency Assistance 911  [facility] and/or Residential Mobile Crisis Unit telephone number  Request made of family/significant other to:  Remove weapons (e.g., guns, rifles, knives), all items previously/currently identified as safety concern.    Remove drugs/medications (over-the-counter, prescriptions, illicit drugs), all items previously/currently identified as a safety concern.  The family member/significant other verbalizes understanding of the suicide prevention education information provided.  The family member/significant other agrees to remove the items of safety concern listed above.  Cooper RenderSadler, Miriam Liles Jean Horne 11/01/2013, 9:05 PM

## 2013-11-01 NOTE — ED Notes (Signed)
Called [facility] to give report, was placed on hold. The line was never picked up. Attempted several times to call back, but the line was busy. Secretary called and spoke with someone, but then was also placed on hold.

## 2013-11-01 NOTE — ED Notes (Signed)
Sitter called for [facility], non available
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
## 2013-11-01 NOTE — ED Notes (Signed)
I had the patient remove her clothing. She has 1 bra, 1 red t-shirt, 1 pair of blue jeans, and 1 pair of flip flops. Her cell phone as also been taken away from her. They have all been placed in a patients belonging bag, with her sticker on the front of the bag.

## 2013-11-01 NOTE — ED Notes (Addendum)
Patient here with complaint that "I just dont feel safe around myself". Superficial cuts noted to bilateral arms and significant other reports that started wellbutrin less than a week ago, reports that she is very stressed about her divorce. Patient reports that she has not drank or eaten in 3 days, appears very sleepy and states only took 1 mg of xanax earlier today
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
## 2013-11-01 NOTE — Plan of Care (Signed)
[facility] Observation Crisis Plan  Reason for Crisis Plan:  Crisis Stabilization   Plan of Care:  Referral for Telepsychiatry/Psychiatric Consult  Family Support:      Current Living Environment:  Living Arrangements: Spouse/significant other  Insurance:   Hospital Account   Name Acct ID Class Status Primary Coverage   Carol [name], Carol [name] [id number] BEHAVIORAL HEALTH OBSERVATION Open Carol [name] - [name] UMR        Guarantor Account (for Hospital Account [id number])   Name Relation to Pt Service Area Active? Acct Type   Carol [name], Carol [name] Self CHSA Yes Behavioral Health   Address Phone       [address] [place], [place] [address] [phone](H)          Coverage Information (for Hospital Account [id number])   F/O Payor/Plan Precert #   [place] [name]/[place] [facility]    Subscriber Subscriber #   Carol [name], Carol [name]   Address Phone   [address] [place], [place] [id number] [phone]      Legal Guardian:   Patient   Primary Care Provider:  Kirstie PeriSHAH,ASHISH, [name]  Current Outpatient Providers:  Carol [name]  Psychiatrist:   Dr. Annitta Needsebra [name]  Counselor/Therapist:   NA  Compliant with Medications:  No  Additional Information:   Carol [name], Carol [name] 7/5/20158:46 PM

## 2013-11-01 NOTE — Plan of Care (Signed)
[facility] Observation Crisis Plan  Reason for Crisis Plan:  Crisis Stabilization   Plan of Care:  Referral for Telepsychiatry/Psychiatric Consult  Family Support:   yes Mother  Current Living Environment:  Living Arrangements: Spouse/significant other  Insurance:   Hospital Account   Name Acct ID Class Status Primary Coverage   Carol [name], Carol [name] [id number] BEHAVIORAL HEALTH OBSERVATION Open [employer] EMPLOYEE - [place] U[employer]        Guarantor Account (for Hospital Account [id number])   Name Relation to Pt Service Area Active? Acct Type   Carol [name], Analyah [name] Self CHSA Yes Behavioral Health   Address Phone       [address] [place], [place] [address] [phone])          Coverage Information (for Hospital Account [id number])   F/O Payor/Plan Precert #   [place] EMPLOYEE/ [facility]    Subscriber Subscriber #   Carol [name] [id number]   Address Phone   [address] [place], [place] [id number] [phone]      Legal Guardian:     Primary Care Provider:  Kirstie PeriSHAH,ASHISH, [name]  Current Outpatient Providers:  Carol [name]  Psychiatrist:   Dr. Annitta Needsebra [name]  Counselor/Therapist:   NA  Compliant with Medications:  Yes  Additional Information:   Carol [name], Carol [name] Carol [name] 7/5/20159:01 PM

## 2013-11-02 ENCOUNTER — Telehealth (HOSPITAL_COMMUNITY): Payer: Self-pay | Admitting: *Deleted

## 2013-11-02 MED ORDER — NICOTINE 21 MG/24HR TD PT24
21.0000 mg | MEDICATED_PATCH | Freq: Every day | TRANSDERMAL | Status: DC
  Administered 2013-11-02: 21 mg via TRANSDERMAL
  Filled 2013-11-02 (×3): qty 1

## 2013-11-02 MED ORDER — BUPROPION HCL ER (XL) 150 MG PO TB24: 150.0000 mg | ORAL_TABLET | ORAL | Status: AC

## 2013-11-02 NOTE — Telephone Encounter (Signed)
She can make own determination about discharge f/u

## 2013-11-02 NOTE — Discharge Instructions (Signed)
You have an [facility] at [facility] [place].  You are scheduled to see Dr Michae KavaAgarwal on Thursday, 12/10/2013 at 10:30 am.  [facility] [address] [place], [place] [address] [phone]  If you have a behavioral health crisis you have several options, all of which are available 24 hours a day, 7 days a week:  *Call the phone numbers on your No-Harm Contract: [phone] or toll free [phone] *Call the [facility] crisis number: [phone] *Call 911 *Go to your local hospital emergency department

## 2013-11-02 NOTE — Progress Notes (Signed)
Patient ID: Carol [name], female   DOB: [birth year], 28 y.o.   MRN: [id number] Discharge Note-Seen by Dr. Lucianne MussKumar and Maura Crandallonrad W. NP and it was determined that she would be discharged and follow up with her outpatient provider. She states she is feeling better and wants to be discharged. She states through conversations with her friends she knows now what to do about some people, which is to cut them loose. She feels supported by certain people and more hopeful. She has follow up appt with Dr. Tenny Crawoss, see AVS. All her property was returned to her. Reviewed her discharge plan and she verbalized her understanding and plans to follow through.She denies any thoughts to hurt self or others and is not psychotic.

## 2013-11-02 NOTE — Plan of Care (Addendum)
[facility] Observation Crisis Plan  Reason for Crisis Plan:  Chronic Mental Illness/Medical Illness   Plan of Care:  [facility] [place]  Family Support:    Pt identifies her mother, Clayton Lefortina Cheney [phone]), as her main social support.  She is separated from her spouse with intent to divorce.  Current Living Environment:  Living Arrangements: Spouse/significant other Pt reports that she is currently living alone.  Insurance:  UMR through her mother, Clayton Lefortina Cheney.  Hospital Account   Name Acct ID Class Status Primary Coverage   Delano MetzGilmore, Fotini E [id number] BEHAVIORAL HEALTH OBSERVATION [employer] EMPLOYEE - [place] [employer]        Guarantor Account (for Hospital Account [id number])   Name Relation to Pt Service Area Active? Acct Type   Delano MetzGilmore, Raychell E Self CHSA Yes Behavioral Health   Address Phone       [address] [place], [place] [address] [phone])          Coverage Information (for Hospital Account [id number])   F/O Payor/Plan Precert #   [place] EMPLOYEE/[place] [facility]    Subscriber Subscriber #   Terrance MassChaney, Tina [id number]   Address Phone   [address] [place], [place] [id number] [phone]      Legal Guardian:   Self  Primary Care Provider:  Kirstie PeriSHAH,ASHISH, MD  Current Outpatient Providers:  Diannia Rudereborah Ross, MD at [facility] [place]  Psychiatrist:   Diannia Rudereborah Ross, MD  Counselor/Therapist:   None currently  Compliant with Medications:  Yes  Additional Information: After consulting with Claudette Headonrad Withrow, NP it has been determined that psychiatric hospitalization is not indicated for this pt at this time.  She has been seeing Diannia Rudereborah Ross, MD at [facility] [place], but would like to be transferred to the service of a provider [facility].  She was offered the option of MH-IOP but declined this.  I called the [facility] office  and was told that I would first need Dr Charlott Rakesoss's approval.  Pt has already signed Consent for Release of Information to Dr Tenny Crawoss.  At 14:57 I spoke to Dr Tenny Crawoss and she approved the transfer of services.  I then spoke to the [facility].  Pt is scheduled to see Dr Michae KavaAgarwal on Thursday, 12/10/2013 at 10:30 am.  This will appear on pt's discharge instructions.  Pt signed a Energy managero-Harm Contract.  Doylene Canninghomas Nikayla Madaris, MA Triage Specialist Raphael GibneyHughes, Dorthea Maina Patrick 7/6/20153:09 PM

## 2013-11-02 NOTE — H&P (Signed)
[facility] UNIT H&P  Patient Identification:  [name] Date of Evaluation:  11/02/2013 Chief Complaint:  MDD  Subjective: Pt seen and chart reviewed. Pt denies SI, HI, and AVH, contracts for safety. Pt will followup with outpatient treatment. Seen and evaluated by this NP along with Dr. Dwyane Dee.   History of Present Illness:: TAZIAH [name] is an 28 y.o. female that was seen this day via tele assessment after calling EDP Dockerty and getting clinical on the pt. Pt's tele assessment appt scheduled with pt's nurse, [facility] @ 1655 and completed by this clinician. Pt presents to the ED complaining of a severe episode of depression with SI, as pt put a knife to her neck and cutting on both arms, and severe constant headache. Pt reports cut both of her arms superficially with sunglasses. Pt stated this is because of current stress of going through a divorce currently with her wife and stress from her job and feeling like she is alone a lot of the time. Pt reports that she took her medicine last night, felt like she was seeing someone hanging from her neighbor's flag, believed she was seeing "fuzzy figures", and also had blurry vision. Pt states that she felt that she needed attention from her spouse, and that she feels as if no one understands her pain. She stated her spouse doesn't want to work things out. Pt reports that she has dealt with severe depression and anxiety since the age of 83 and that it has become worse within the last 6 months. Pt states that her last visit to [facility] was in 03/2013. At that time, pt reported SI with an attempt to overdose by putting pills in her mouth, but didn't go through with it. Pt states that she has recently started a new medication (Wellbutrin) and sees a psychiatrist, Dr. Levonne Spiller, with [facility]. She is also prescribed Xanax for anxiety and panic attacks. She has a diagnosis of ADHD per Dr. Harrington Challenger' last note (see EPIC) and reports a longstanding hx of  depression. Pt denies use of drugs, alcohol, or any previous issues with drugs or alcohol. Pt denies HI or psychosis, stating she believes she saw the fuzzy figures last night due to sleep deprivation. Pt reports h/o of gallbladder issues, and kidney stones and reports this is also a stressor for her. Pt calm, cooperative, oriented x 4, had depressed mood, logical/coherent thought processes, normal speech and appropriate affect. Consulted with Mertha Finders, NP, who accepted pt to [facility] Observation Unit @ 1720 to Dr. Dwyane Dee. Updated Letitia Libra, [facility], who stated pt could come to [facility] once vitals stable. Per pt's nurse, blood pressure and heart rate stable. Updated [facility] and TTS staff. Consulted with EDP Dockerty, who was in agreement with disposition. Pt to be transported via [facility] to [facility] as she is voluntary and wants treatment. Pt's nurse, Christena Deem, to assist with consent paperwork and transport.    Total Time spent with patient: 45 minutes  Psychiatric Specialty Exam: Physical Exam Full Physical Exam performed in ED; reviewed, stable, and I concur with this assessment.   Review of Systems  Constitutional: Negative.   HENT: Negative.   Eyes: Negative.   Respiratory: Negative.   Cardiovascular: Negative.   Gastrointestinal: Negative.   Genitourinary: Negative.   Musculoskeletal: Negative.   Skin: Negative.   Neurological: Negative.   Endo/Heme/Allergies: Negative.   Psychiatric/Behavioral: Positive for depression. The patient is nervous/anxious.     Blood pressure 119/80, pulse 81, temperature 97.9 F (  36.6 C), temperature source Oral, resp. rate 16, height 5' 6"  (1.676 m), weight 100.699 kg (222 lb), last menstrual period 10/31/2013.Body mass index is 35.85 kg/(m^2).  General Appearance: Fairly Groomed  Engineer, water::  Good  Speech:  Clear and Coherent  Volume:  Normal  Mood:  Anxious and Depressed  Affect:  Appropriate and Depressed  Thought Process:  Coherent and Goal Directed  Orientation:   Full (Time, Place, and Person)  Thought Content:  WDL  Suicidal Thoughts:  No  Homicidal Thoughts:  No  Memory:  Immediate;   Fair Recent;   Fair Remote;   Fair  Judgement:  Fair  Insight:  Fair  Psychomotor Activity:  Normal  Concentration:  Good  Recall:  Good  Fund of Knowledge:Good  Language: Good  Akathisia:  No  Handed:  Right  AIMS (if indicated):     Assets:  Communication Skills Desire for Improvement Resilience  Sleep:       Musculoskeletal: Strength & Muscle Tone: within normal limits Gait & Station: normal Patient leans: N/A   Past Medical History:   Past Medical History  Diagnosis Date  . Bronchitis   . Anxiety   . Hypertension   . Urinary tract infection   . Irritable bowel   . Depression   . Asthma   . Increased frequency of headaches   . Fatty liver disease, nonalcoholic   . Migraines   . Kidney stones   . Gall stones    None. Allergies:   Allergies  Allergen Reactions  . Sulfa Antibiotics Hives and Shortness Of Breath  . Pseudoephedrine Palpitations and Other (See Comments)    sweats  . Cephalosporins     hives  . Ciprocin-Fluocin-Procin [Fluocinolone Acetonide] Hives  . Ciprofloxacin Hcl Hives  . Flagyl [Metronidazole] Other (See Comments)    Causes a greenish purple fuzz on tongue  . Hydrochlorothiazide     Sweaty, faint feeling  . Penicillins Hives   PTA Medications: Prescriptions prior to admission  Medication Sig Dispense Refill  . acetaminophen (TYLENOL) 325 MG tablet Take 650 mg by mouth every 6 (six) hours as needed for headache.      . ALPRAZolam (XANAX) 1 MG tablet Take 1 mg by mouth 3 (three) times daily as needed for anxiety.      Marland Kitchen buPROPion (WELLBUTRIN XL) 150 MG 24 hr tablet Take 150 mg by mouth every morning.      Marland Kitchen HYDROcodone-acetaminophen (NORCO/VICODIN) 5-325 MG per tablet Take 2 tablets by mouth every 4 (four) hours as needed for moderate pain or severe pain.      Marland Kitchen ibuprofen (ADVIL,MOTRIN) 200 MG tablet Take  400 mg by mouth every 6 (six) hours as needed for headache.      . ondansetron (ZOFRAN-ODT) 4 MG disintegrating tablet Take 4 mg by mouth every 8 (eight) hours as needed for nausea.      . [DISCONTINUED] ALPRAZolam (XANAX) 1 MG tablet Take 1 tablet (1 mg total) by mouth 3 (three) times daily as needed for anxiety.  90 tablet  2  . [DISCONTINUED] buPROPion (WELLBUTRIN XL) 150 MG 24 hr tablet Take 1 tablet (150 mg total) by mouth every morning.  30 tablet  2  . [DISCONTINUED] HYDROcodone-acetaminophen (NORCO/VICODIN) 5-325 MG per tablet Take 2 tablets by mouth every 4 (four) hours as needed.  10 tablet  0  . [DISCONTINUED] ondansetron (ZOFRAN ODT) 4 MG disintegrating tablet Take 1 tablet (4 mg total) by mouth every 8 (eight) hours as needed for  nausea.  6 tablet  0    Previous Psychotropic Medications:  Medication/Dose  SEE MAR               Substance Abuse History in the last 12 months:  Yes.    Consequences of Substance Abuse: NA  Social History:  reports that she has been smoking Cigarettes.  She has been smoking about 1.00 pack per day. She does not have any smokeless tobacco history on file. She reports that she does not drink alcohol or use illicit drugs. Additional Social History:                      Family History:   Family History  Problem Relation Age of Onset  . Physical abuse Mother   . Anxiety disorder Mother   . ADD / ADHD Mother   . Paranoid behavior Mother   . Drug abuse Father   . Depression Father   . Alcohol abuse Maternal Grandfather   . ADD / ADHD Maternal Grandmother   . Anxiety disorder Maternal Grandmother   . Dementia Maternal Grandmother   . OCD Maternal Grandmother   . Sexual abuse Maternal Grandmother   . Alcohol abuse Paternal Grandfather   . Bipolar disorder Cousin   . Schizophrenia Neg Hx   . Seizures Neg Hx     Results for orders placed during the hospital encounter of 11/01/13 (from the past 72 hour(s))  ACETAMINOPHEN LEVEL      Status: None   Collection Time    11/01/13  3:40 PM      Result Value Ref Range   Acetaminophen (Tylenol), Serum <15.0  10 - 30 ug/mL   Comment:            THERAPEUTIC CONCENTRATIONS VARY     SIGNIFICANTLY. A RANGE OF 10-30     ug/mL MAY BE AN EFFECTIVE     CONCENTRATION FOR MANY PATIENTS.     HOWEVER, SOME ARE BEST TREATED     AT CONCENTRATIONS OUTSIDE THIS     RANGE.     ACETAMINOPHEN CONCENTRATIONS     >150 ug/mL AT 4 HOURS AFTER     INGESTION AND >50 ug/mL AT 12     HOURS AFTER INGESTION ARE     OFTEN ASSOCIATED WITH TOXIC     REACTIONS.  CBC     Status: Abnormal   Collection Time    11/01/13  3:40 PM      Result Value Ref Range   WBC 11.3 (*) 4.0 - 10.5 K/uL   RBC 4.95  3.87 - 5.11 MIL/uL   Hemoglobin 15.3 (*) 12.0 - 15.0 g/dL   HCT 43.9  36.0 - 46.0 %   MCV 88.7  78.0 - 100.0 fL   MCH 30.9  26.0 - 34.0 pg   MCHC 34.9  30.0 - 36.0 g/dL   RDW 12.2  11.5 - 15.5 %   Platelets 335  150 - 400 K/uL  COMPREHENSIVE METABOLIC PANEL     Status: Abnormal   Collection Time    11/01/13  3:40 PM      Result Value Ref Range   Sodium 143  137 - 147 mEq/L   Potassium 3.9  3.7 - 5.3 mEq/L   Chloride 103  96 - 112 mEq/L   CO2 24  19 - 32 mEq/L   Glucose, Bld 101 (*) 70 - 99 mg/dL   BUN 8  6 - 23 mg/dL   Creatinine, Ser 0.80  0.50 - 1.10 mg/dL   Calcium 9.9  8.4 - 10.5 mg/dL   Total Protein 8.3  6.0 - 8.3 g/dL   Albumin 4.6  3.5 - 5.2 g/dL   AST 19  0 - 37 U/L   ALT 26  0 - 35 U/L   Alkaline Phosphatase 60  39 - 117 U/L   Total Bilirubin 0.4  0.3 - 1.2 mg/dL   GFR calc non Af Amer >90  >90 mL/min   GFR calc Af Amer >90  >90 mL/min   Comment: (NOTE)     The eGFR has been calculated using the CKD EPI equation.     This calculation has not been validated in all clinical situations.     eGFR's persistently <90 mL/min signify possible Chronic Kidney     Disease.   Anion gap 16 (*) 5 - 15  ETHANOL     Status: None   Collection Time    11/01/13  3:40 PM      Result Value Ref  Range   Alcohol, Ethyl (B) <11  0 - 11 mg/dL   Comment:            LOWEST DETECTABLE LIMIT FOR     SERUM ALCOHOL IS 11 mg/dL     FOR MEDICAL PURPOSES ONLY  SALICYLATE LEVEL     Status: Abnormal   Collection Time    11/01/13  3:40 PM      Result Value Ref Range   Salicylate Lvl <1.0 (*) 2.8 - 20.0 mg/dL   Psychological Evaluations:  Assessment:   DSM5:  Depressive Disorders:  Major Depressive Disorder - Severe (296.23)  AXIS I:  Generalized Anxiety Disorder and Major Depression, Recurrent severe AXIS II:  Deferred AXIS III:   Past Medical History  Diagnosis Date  . Bronchitis   . Anxiety   . Hypertension   . Urinary tract infection   . Irritable bowel   . Depression   . Asthma   . Increased frequency of headaches   . Fatty liver disease, nonalcoholic   . Migraines   . Kidney stones   . Gall stones    AXIS IV:  other psychosocial or environmental problems and problems related to social environment AXIS V:  51-60 moderate symptoms  Treatment Plan/Recommendations:   Review of chart, vital signs, medications, and notes.  1-Medication management for depression and anxiety: Medications reviewed with the patient and she stated no untoward effects, unchanged. 2-Coping skills for depression, anxiety  3-Continue crisis stabilization and management  4-Address health issues--monitoring vital signs, stable  5-Treatment plan in progress to prevent relapse of depression and anxiety  Treatment Plan Summary: Daily contact with patient to assess and evaluate symptoms and progress in treatment Medication management *Follow up with outpatient psychiatry. Referrals with [facility] TTS to assist pt with this process.  Current Medications:  Current Facility-Administered Medications  Medication Dose Route Frequency Provider Last Rate Last Dose  . acetaminophen (TYLENOL) tablet 650 mg  650 mg Oral Q6H PRN Lurena Nida, NP   650 mg at 11/01/13 2231  . ALPRAZolam Duanne Moron) tablet 1 mg  1 mg Oral  TID PRN Lurena Nida, NP   1 mg at 11/02/13 0739  . alum & mag hydroxide-simeth (MAALOX/MYLANTA) 200-200-20 MG/5ML suspension 30 mL  30 mL Oral Q4H PRN Lurena Nida, NP      . buPROPion (WELLBUTRIN XL) 24 hr tablet 150 mg  150 mg Oral Daily Lurena Nida, NP   150 mg  at 11/02/13 0739  . magnesium hydroxide (MILK OF MAGNESIA) suspension 30 mL  30 mL Oral Daily PRN Lurena Nida, NP      . nicotine (NICODERM CQ - dosed in mg/24 hours) patch 21 mg  21 mg Transdermal Daily Benjamine Mola, FNP   21 mg at 11/02/13 0931  . ondansetron (ZOFRAN-ODT) disintegrating tablet 4 mg  4 mg Oral Q8H PRN Lurena Nida, NP   4 mg at 11/01/13 2231    Benjamine Mola, FNP-BC 7/6/201512:11 PM

## 2013-11-02 NOTE — Progress Notes (Signed)
Patient ID: Carol MetzBrittany E [name], female   DOB: February 10, 1986, 28 y.o.   MRN: [id number] D-Awake all am. Sad affect. States she is unsure if she needs to be admitted, and when asked about her support system she said she had some people she could stay with "sort of" She has been on the phone much of shift, but didn't disclose specifics or who she was speaking with.Dis shower this am. Complains of stomach discomfort but declined any medications or comfort orders offered. She is on her period at this time.She requested her Zanax this am stating she routinely takes it at home three times a day. It is written while here in OBS for prn TID.She states she is anxious and stays anxious. A-Support offered. Monitored for safety. Medications as ordered. R- Fair appetite. Passive SI, states not sure if she is safe or not if she goes home. Waiting on Conrad NP to make recommendation for disposition from OBS.

## 2013-11-02 NOTE — Progress Notes (Signed)
Patient ID: Carol [name], female   DOB: 04/19/1986, 28 y.o.   MRN: [id number] Complains of anxiety and stomach upset and given prns of Zofran and Xanax as ordered. She states she is always nervous and almost never gets to a 0 if 10 is the worst anxiety. She is normally at a 3 or a 4. She currently is more anxious than her baseline.
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
## 2013-11-22 NOTE — H&P (Signed)
[facility] and [facility]

## 2013-11-22 NOTE — Discharge Summary (Signed)
Patient seen, evaluated by me.

## 2013-11-27 ENCOUNTER — Emergency Department (HOSPITAL_BASED_OUTPATIENT_CLINIC_OR_DEPARTMENT_OTHER)
Admission: EM | Admit: 2013-11-27 | Discharge: 2013-11-27 | Disposition: A | Discharge status: Home / Self Care | Payer: 59 | Attending: Emergency Medicine | Admitting: Emergency Medicine

## 2013-11-27 ENCOUNTER — Encounter (HOSPITAL_BASED_OUTPATIENT_CLINIC_OR_DEPARTMENT_OTHER): Payer: Self-pay | Admitting: Emergency Medicine

## 2013-11-27 DIAGNOSIS — G43909 Migraine, unspecified, not intractable, without status migrainosus: Secondary | ICD-10-CM | POA: Insufficient documentation

## 2013-11-27 DIAGNOSIS — J45901 Unspecified asthma with (acute) exacerbation: Secondary | ICD-10-CM | POA: Insufficient documentation

## 2013-11-27 DIAGNOSIS — F411 Generalized anxiety disorder: Secondary | ICD-10-CM | POA: Insufficient documentation

## 2013-11-27 DIAGNOSIS — F17200 Nicotine dependence, unspecified, uncomplicated: Secondary | ICD-10-CM | POA: Insufficient documentation

## 2013-11-27 DIAGNOSIS — Z792 Long term (current) use of antibiotics: Secondary | ICD-10-CM | POA: Insufficient documentation

## 2013-11-27 DIAGNOSIS — N390 Urinary tract infection, site not specified: Secondary | ICD-10-CM | POA: Insufficient documentation

## 2013-11-27 DIAGNOSIS — R519 Headache, unspecified: Secondary | ICD-10-CM

## 2013-11-27 DIAGNOSIS — Z880 Allergy status to penicillin: Secondary | ICD-10-CM | POA: Insufficient documentation

## 2013-11-27 DIAGNOSIS — F3289 Other specified depressive episodes: Secondary | ICD-10-CM | POA: Insufficient documentation

## 2013-11-27 DIAGNOSIS — Z3202 Encounter for pregnancy test, result negative: Secondary | ICD-10-CM | POA: Insufficient documentation

## 2013-11-27 DIAGNOSIS — R51 Headache: Secondary | ICD-10-CM | POA: Insufficient documentation

## 2013-11-27 DIAGNOSIS — Z79899 Other long term (current) drug therapy: Secondary | ICD-10-CM | POA: Insufficient documentation

## 2013-11-27 DIAGNOSIS — F329 Major depressive disorder, single episode, unspecified: Secondary | ICD-10-CM | POA: Insufficient documentation

## 2013-11-27 DIAGNOSIS — I10 Essential (primary) hypertension: Secondary | ICD-10-CM | POA: Insufficient documentation

## 2013-11-27 DIAGNOSIS — Z87442 Personal history of urinary calculi: Secondary | ICD-10-CM | POA: Insufficient documentation

## 2013-11-27 DIAGNOSIS — Z8719 Personal history of other diseases of the digestive system: Secondary | ICD-10-CM | POA: Insufficient documentation

## 2013-11-27 LAB — PREGNANCY, URINE: PREG TEST UR: NEGATIVE

## 2013-11-27 LAB — URINALYSIS, ROUTINE W REFLEX MICROSCOPIC
BILIRUBIN URINE: NEGATIVE
Glucose, UA: NEGATIVE mg/dL
Ketones, ur: 15 mg/dL — AB
Nitrite: NEGATIVE
PROTEIN: NEGATIVE mg/dL
SPECIFIC GRAVITY, URINE: 1.017 (ref 1.005–1.030)
UROBILINOGEN UA: 1 mg/dL (ref 0.0–1.0)
pH: 5.5 (ref 5.0–8.0)

## 2013-11-27 LAB — URINE MICROSCOPIC-ADD ON

## 2013-11-27 MED ORDER — SODIUM CHLORIDE 0.9 % IV BOLUS (SEPSIS)
1000.0000 mL | Freq: Once | INTRAVENOUS | Status: AC
  Administered 2013-11-27: 1000 mL via INTRAVENOUS

## 2013-11-27 MED ORDER — DIPHENHYDRAMINE HCL 50 MG/ML IJ SOLN
25.0000 mg | Freq: Once | INTRAMUSCULAR | Status: AC
  Administered 2013-11-27: 25 mg via INTRAVENOUS
  Filled 2013-11-27: qty 1

## 2013-11-27 MED ORDER — NITROFURANTOIN MONOHYD MACRO 100 MG PO CAPS: 100.0000 mg | ORAL_CAPSULE | Freq: Two times a day (BID) | ORAL | Status: DC

## 2013-11-27 MED ORDER — ONDANSETRON 4 MG PO TBDP: 4.0000 mg | ORAL_TABLET | Freq: Three times a day (TID) | ORAL | Status: DC | PRN

## 2013-11-27 MED ORDER — KETOROLAC TROMETHAMINE 30 MG/ML IJ SOLN
30.0000 mg | Freq: Once | INTRAMUSCULAR | Status: AC
  Administered 2013-11-27: 30 mg via INTRAVENOUS
  Filled 2013-11-27: qty 1

## 2013-11-27 MED ORDER — METOCLOPRAMIDE HCL 5 MG/ML IJ SOLN
10.0000 mg | Freq: Once | INTRAMUSCULAR | Status: AC
  Administered 2013-11-27: 10 mg via INTRAVENOUS
  Filled 2013-11-27: qty 2

## 2013-11-27 MED ORDER — LORAZEPAM 2 MG/ML IJ SOLN
1.0000 mg | Freq: Once | INTRAMUSCULAR | Status: AC
  Administered 2013-11-27: 1 mg via INTRAVENOUS
  Filled 2013-11-27: qty 1

## 2013-11-27 NOTE — ED Notes (Signed)
Ativan not given due to pt not having a ride home.

## 2013-11-27 NOTE — ED Notes (Signed)
Pt reports HA x several hours.  Denies meds PTA.  Reports nausea

## 2013-11-27 NOTE — ED Notes (Signed)
Pt. Has complaints about the discomfort of the bed and other complaints of things RN can not control.  Pt. Taking extra time to put her gown on and cont to delay any progress of care to be done.

## 2013-11-27 NOTE — ED Notes (Signed)
Side rales up x 2 and call bell in Pt. Hand.  Pt. Drove to ED alone and reports she has a ride home.

## 2013-11-27 NOTE — ED Notes (Signed)
Pt is mad because I wont give her Ativan without a ride. Called a potential ride and told her we refused to treat her without someone here. Pt educated on risk of driving with narcotics and hypnotic medications. Reminded she has been treated for pain and I will re assess her pain in 30 mins.

## 2013-11-27 NOTE — ED Provider Notes (Signed)
CSN: [id number]     Arrival date & time 11/27/13  1543 History   First MD Initiated Contact with Patient 11/27/13 1612     Chief Complaint  Patient presents with  . Headache     (Consider location/radiation/quality/duration/timing/severity/associated sxs/prior Treatment) HPI Pt presents with c/o headache, she states her headache is generalized, throbbing in nature. Has been gradually worsening, she states the headache has been present for several days but worse today.  She states she has not been drinking much liquids and has had decreased urine output.  No fever/chills.  No neck pain.  No vomiting but has been nauseated.  No changes in vision or speech, no focal weakness.  Has hx of migraines and this feels similar to prior migraines but more intense.   There are no other associated systemic symptoms, there are no other alleviating or modifying factors.   Past Medical History  Diagnosis Date  . Bronchitis   . Anxiety   . Hypertension   . Urinary tract infection   . Irritable bowel   . Depression   . Asthma   . Increased frequency of headaches   . Fatty liver disease, nonalcoholic   . Migraines   . Kidney stones   . Gall stones    Past Surgical History  Procedure Laterality Date  . No past surgeries     Family History  Problem Relation Age of Onset  . Physical abuse Mother   . Anxiety disorder Mother   . ADD / ADHD Mother   . Paranoid behavior Mother   . Drug abuse Father   . Depression Father   . Alcohol abuse Maternal Grandfather   . ADD / ADHD Maternal Grandmother   . Anxiety disorder Maternal Grandmother   . Dementia Maternal Grandmother   . OCD Maternal Grandmother   . Sexual abuse Maternal Grandmother   . Alcohol abuse Paternal Grandfather   . Bipolar disorder Cousin   . Schizophrenia Neg Hx   . Seizures Neg Hx    History  Substance Use Topics  . Smoking status: Current Every Day Smoker -- 1.00 packs/day    Types: Cigarettes  . Smokeless tobacco: Not on  file  . Alcohol Use: No   OB History   Grav Para Term Preterm Abortions TAB SAB Ect Mult Living                 Review of Systems ROS reviewed and all otherwise negative except for mentioned in HPI    Allergies  Sulfa antibiotics; Pseudoephedrine; Cephalosporins; Ciprocin-fluocin-procin; Ciprofloxacin hcl; Flagyl; Hydrochlorothiazide; and Penicillins  Home Medications   Prior to Admission medications   Medication Sig Start Date End Date Taking? Authorizing Provider  acetaminophen (TYLENOL) 325 MG tablet Take 650 mg by mouth every 6 (six) hours as needed for headache.    Historical Provider, MD  ALPRAZolam Prudy Feeler) 1 MG tablet Take 1 mg by mouth 3 (three) times daily as needed for anxiety. 10/29/13 10/29/14  Diannia Ruder, MD  buPROPion (WELLBUTRIN XL) 150 MG 24 hr tablet Take 1 tablet (150 mg total) by mouth every morning. 11/02/13 11/02/14  Beau Fanny, FNP  HYDROcodone-acetaminophen (NORCO/VICODIN) 5-325 MG per tablet Take 2 tablets by mouth every 4 (four) hours as needed for moderate pain or severe pain. 09/23/13   Rolland Porter, MD  ibuprofen (ADVIL,MOTRIN) 200 MG tablet Take 400 mg by mouth every 6 (six) hours as needed for headache.    Historical Provider, MD  nitrofurantoin, macrocrystal-monohydrate, (MACROBID) 100 MG capsule  Take 1 capsule (100 mg total) by mouth 2 (two) times daily. 11/27/13   Ethelda ChickMartha K Linker, MD  ondansetron (ZOFRAN ODT) 4 MG disintegrating tablet Take 1 tablet (4 mg total) by mouth every 8 (eight) hours as needed for nausea or vomiting. 11/27/13   Ethelda ChickMartha K Linker, MD  ondansetron (ZOFRAN-ODT) 4 MG disintegrating tablet Take 4 mg by mouth every 8 (eight) hours as needed for nausea. 09/23/13   Rolland PorterMark James, MD   BP 112/75  Pulse 95  Temp(Src) 97.3 F (36.3 C) (Oral)  Resp 18  Ht 5\' 6"  (1.676 m)  Wt 210 lb (95.255 kg)  BMI 33.91 kg/m2  SpO2 100%  LMP 10/31/2013 Vitals reviewed Physical Exam Physical Examination: General appearance - alert, uncomfortable appearing,  and in no distress Mental status - alert, oriented to person, place, and time Eyes - pupils equal and reactive, extraocular eye movements intact Mouth - mucous membranes moist, pharynx normal without lesions Neck - supple, no significant adenopathy Chest - clear to auscultation, no wheezes, rales or rhonchi, symmetric air entry Heart - normal rate, regular rhythm, normal S1, S2, no murmurs, rubs, clicks or gallops Abdomen - soft, nontender, nondistended, no masses or organomegaly Neurological - alert, oriented, normal speech, cranial nerves 2-12 tested and intact, strength 5/5 in extremities x 4, sensation intact Extremities - peripheral pulses normal, no pedal edema, no clubbing or cyanosis Skin - normal coloration and turgor, no rashes  ED Course  Procedures (including critical care time)  6:03 PM pt continues to have headache but has had some improvement.  Will give toradol and ativan in addition to another liter of fluids.  Labs Review Labs Reviewed  URINALYSIS, ROUTINE W REFLEX MICROSCOPIC - Abnormal; Notable for the following:    APPearance CLOUDY (*)    Hgb urine dipstick LARGE (*)    Ketones, ur 15 (*)    Leukocytes, UA MODERATE (*)    All other components within normal limits  URINE MICROSCOPIC-ADD ON - Abnormal; Notable for the following:    Squamous Epithelial / LPF FEW (*)    Bacteria, UA MANY (*)    All other components within normal limits  PREGNANCY, URINE    Imaging Review No results found.   EKG Interpretation None      MDM   Final diagnoses:  Headache, unspecified headache type  UTI (lower urinary tract infection)    Pt presenting with c/o headache with associated nausea.  Normal neuro exam.  Pt given migraine cocktail with some relief as well as IV fluids,  Urine c/w UTI- will start on macrobid given mulitple other abx allergies.  [facility], meningitis or other acute emergent process at this time. Discharged with strict return  precautions.  Pt agreeable with plan.    Ethelda ChickMartha K Linker, MD 11/28/13 [id number]

## 2013-11-27 NOTE — Discharge Instructions (Signed)
Return to the ED with any concerns including vomiting and not able to keep down liquids, changes in vision or speech, weakness of arms or legs, decreased level of alertness/lethargy, or any other alarming symptoms °

## 2013-11-27 NOTE — ED Notes (Signed)
States she is feeling better. Her ride is here. Will give Ativan. She is eating and drinking without nausea.

## 2013-12-08 ENCOUNTER — Emergency Department (HOSPITAL_BASED_OUTPATIENT_CLINIC_OR_DEPARTMENT_OTHER)
Admission: EM | Admit: 2013-12-08 | Discharge: 2013-12-08 | Disposition: A | Discharge status: Home / Self Care | Payer: 59 | Attending: Emergency Medicine | Admitting: Emergency Medicine

## 2013-12-08 ENCOUNTER — Encounter (HOSPITAL_BASED_OUTPATIENT_CLINIC_OR_DEPARTMENT_OTHER): Payer: Self-pay | Admitting: Emergency Medicine

## 2013-12-08 DIAGNOSIS — Z87440 Personal history of urinary (tract) infections: Secondary | ICD-10-CM | POA: Diagnosis not present

## 2013-12-08 DIAGNOSIS — F411 Generalized anxiety disorder: Secondary | ICD-10-CM | POA: Insufficient documentation

## 2013-12-08 DIAGNOSIS — R42 Dizziness and giddiness: Secondary | ICD-10-CM | POA: Diagnosis present

## 2013-12-08 DIAGNOSIS — F3289 Other specified depressive episodes: Secondary | ICD-10-CM | POA: Diagnosis not present

## 2013-12-08 DIAGNOSIS — Z880 Allergy status to penicillin: Secondary | ICD-10-CM | POA: Insufficient documentation

## 2013-12-08 DIAGNOSIS — J45909 Unspecified asthma, uncomplicated: Secondary | ICD-10-CM | POA: Insufficient documentation

## 2013-12-08 DIAGNOSIS — F17200 Nicotine dependence, unspecified, uncomplicated: Secondary | ICD-10-CM | POA: Insufficient documentation

## 2013-12-08 DIAGNOSIS — Z79899 Other long term (current) drug therapy: Secondary | ICD-10-CM | POA: Diagnosis not present

## 2013-12-08 DIAGNOSIS — G43909 Migraine, unspecified, not intractable, without status migrainosus: Secondary | ICD-10-CM | POA: Diagnosis not present

## 2013-12-08 DIAGNOSIS — Z87442 Personal history of urinary calculi: Secondary | ICD-10-CM | POA: Insufficient documentation

## 2013-12-08 DIAGNOSIS — F329 Major depressive disorder, single episode, unspecified: Secondary | ICD-10-CM | POA: Diagnosis not present

## 2013-12-08 DIAGNOSIS — I10 Essential (primary) hypertension: Secondary | ICD-10-CM | POA: Insufficient documentation

## 2013-12-08 DIAGNOSIS — R112 Nausea with vomiting, unspecified: Secondary | ICD-10-CM | POA: Diagnosis not present

## 2013-12-08 DIAGNOSIS — Z8719 Personal history of other diseases of the digestive system: Secondary | ICD-10-CM | POA: Diagnosis not present

## 2013-12-08 MED ORDER — DIPHENHYDRAMINE HCL 50 MG/ML IJ SOLN
25.0000 mg | Freq: Once | INTRAMUSCULAR | Status: AC
  Administered 2013-12-08: 25 mg via INTRAVENOUS
  Filled 2013-12-08: qty 1

## 2013-12-08 MED ORDER — IBUPROFEN 600 MG PO TABS: 600.0000 mg | ORAL_TABLET | Freq: Four times a day (QID) | ORAL | Status: DC | PRN

## 2013-12-08 MED ORDER — METOCLOPRAMIDE HCL 10 MG PO TABS: 10.0000 mg | ORAL_TABLET | Freq: Three times a day (TID) | ORAL | Status: DC | PRN

## 2013-12-08 MED ORDER — DIPHENHYDRAMINE HCL 25 MG PO TABS: 25.0000 mg | ORAL_TABLET | Freq: Four times a day (QID) | ORAL | Status: DC | PRN

## 2013-12-08 MED ORDER — SODIUM CHLORIDE 0.9 % IV BOLUS (SEPSIS)
1000.0000 mL | Freq: Once | INTRAVENOUS | Status: AC
  Administered 2013-12-08: 1000 mL via INTRAVENOUS

## 2013-12-08 MED ORDER — METOCLOPRAMIDE HCL 5 MG/ML IJ SOLN
10.0000 mg | Freq: Once | INTRAMUSCULAR | Status: AC
  Administered 2013-12-08: 10 mg via INTRAVENOUS
  Filled 2013-12-08: qty 2

## 2013-12-08 MED ORDER — KETOROLAC TROMETHAMINE 30 MG/ML IJ SOLN
30.0000 mg | Freq: Once | INTRAMUSCULAR | Status: AC
  Administered 2013-12-08: 30 mg via INTRAVENOUS
  Filled 2013-12-08: qty 1

## 2013-12-08 NOTE — ED Notes (Signed)
MD at bedside. 

## 2013-12-08 NOTE — ED Notes (Signed)
Pt c/o dizziness and vomiting x 2 hrs  Also c/o h/a

## 2013-12-08 NOTE — ED Provider Notes (Signed)
CSN: [id number]     Arrival date & time 12/08/13  0001 History  This chart was scribed for Loren Racer, MD by Modena Jansky, ED Scribe. This patient was seen in room MH02/MH02 and the patient's care was started at 12:26 AM.   Chief Complaint  Patient presents with  . Dizziness   HPI HPI Comments: Carol [name] is a 28 y.o. female who presents to the Emergency Department complaining of constant moderate light headedness and dizziness that started about 2 hours ago. She states that she was getting into a car and headed for the ED when the dizziness started. She states that the dizziness is exacerbated by movement. She reports 6 episodes of emesis today. Patient states the vomiting preceded the dizziness.  Pt also complains of a right sided headache that started today. She states that she took oxycodone with no relief. She reports blurry vision in her right eye and photophobia. Patient has a history of migraines with similar presentation to past migraines. Denies any fevers or chills. Chest no neck pain or stiffness. She denies any hearing changes. She has no focal weakness or numbness.   Past Medical History  Diagnosis Date  . Bronchitis   . Anxiety   . Hypertension   . Urinary tract infection   . Irritable bowel   . Depression   . Asthma   . Increased frequency of headaches   . Fatty liver disease, nonalcoholic   . Migraines   . Kidney stones   . Gall stones    Past Surgical History  Procedure Laterality Date  . No past surgeries     Family History  Problem Relation Age of Onset  . Physical abuse Mother   . Anxiety disorder Mother   . ADD / ADHD Mother   . Paranoid behavior Mother   . Drug abuse Father   . Depression Father   . Alcohol abuse Maternal Grandfather   . ADD / ADHD Maternal Grandmother   . Anxiety disorder Maternal Grandmother   . Dementia Maternal Grandmother   . OCD Maternal Grandmother   . Sexual abuse Maternal Grandmother   . Alcohol abuse Paternal  Grandfather   . Bipolar disorder Cousin   . Schizophrenia Neg Hx   . Seizures Neg Hx    History  Substance Use Topics  . Smoking status: Current Every Day Smoker -- 1.00 packs/day    Types: Cigarettes  . Smokeless tobacco: Not on file  . Alcohol Use: No   OB History   Grav Para Term Preterm Abortions TAB SAB Ect Mult Living                 Review of Systems  Constitutional: Negative for fever and chills.  HENT: Positive for facial swelling. Negative for congestion, sinus pressure and sore throat.   Eyes: Positive for photophobia. Negative for visual disturbance.  Gastrointestinal: Positive for nausea and vomiting. Negative for abdominal pain, diarrhea and constipation.  Musculoskeletal: Negative for back pain, myalgias, neck pain and neck stiffness.  Skin: Negative for pallor, rash and wound.  Neurological: Positive for dizziness, light-headedness and headaches. Negative for syncope, weakness and numbness.  All other systems reviewed and are negative.   Allergies  Sulfa antibiotics; Pseudoephedrine; Cephalosporins; Ciprocin-fluocin-procin; Ciprofloxacin hcl; Flagyl; Hydrochlorothiazide; and Penicillins  Home Medications   Prior to Admission medications   Medication Sig Start Date End Date Taking? Authorizing Provider  acetaminophen (TYLENOL) 325 MG tablet Take 650 mg by mouth every 6 (six) hours as needed  for headache.    Historical Provider, MD  ALPRAZolam Prudy Feeler) 1 MG tablet Take 1 mg by mouth 3 (three) times daily as needed for anxiety. 10/29/13 10/29/14  Diannia Ruder, MD  buPROPion (WELLBUTRIN XL) 150 MG 24 hr tablet Take 1 tablet (150 mg total) by mouth every morning. 11/02/13 11/02/14  Beau Fanny, FNP  HYDROcodone-acetaminophen (NORCO/VICODIN) 5-325 MG per tablet Take 2 tablets by mouth every 4 (four) hours as needed for moderate pain or severe pain. 09/23/13   Rolland Porter, MD  ibuprofen (ADVIL,MOTRIN) 200 MG tablet Take 400 mg by mouth every 6 (six) hours as needed for  headache.    Historical Provider, MD  nitrofurantoin, macrocrystal-monohydrate, (MACROBID) 100 MG capsule Take 1 capsule (100 mg total) by mouth 2 (two) times daily. 11/27/13   Ethelda Chick, MD  ondansetron (ZOFRAN ODT) 4 MG disintegrating tablet Take 1 tablet (4 mg total) by mouth every 8 (eight) hours as needed for nausea or vomiting. 11/27/13   Ethelda Chick, MD  ondansetron (ZOFRAN-ODT) 4 MG disintegrating tablet Take 4 mg by mouth every 8 (eight) hours as needed for nausea. 09/23/13   Rolland Porter, MD   BP 131/94  Pulse 78  Temp(Src) 97.9 F (36.6 C) (Oral)  Resp 16  Ht 5\' 6"  (1.676 m)  Wt 204 lb (92.534 kg)  BMI 32.94 kg/m2  SpO2 100%  LMP 11/08/2013 Physical Exam  Nursing note and vitals reviewed. Constitutional: She is oriented to person, place, and time. She appears well-developed and well-nourished. No distress.  HENT:  Head: Normocephalic and atraumatic.  Right Ear: External ear normal.  Left Ear: External ear normal.  Mouth/Throat: Oropharynx is clear and moist.  No sinus tenderness to percussion.  Eyes: EOM are normal. Pupils are equal, round, and reactive to light.  No nystagmus. Photophobia present  Neck: Normal range of motion. Neck supple.  No nuchal rigidity  Cardiovascular: Normal rate and regular rhythm.   Pulmonary/Chest: Effort normal and breath sounds normal. No respiratory distress. She has no wheezes. She has no rales.  Abdominal: Soft. Bowel sounds are normal. She exhibits no distension and no mass. There is no tenderness. There is no rebound and no guarding.  Musculoskeletal: Normal range of motion. She exhibits no edema and no tenderness.  Neurological: She is alert and oriented to person, place, and time.  Patient is alert and oriented x3 with clear, goal oriented speech. Patient has 5/5 motor in all extremities. Sensation is intact to light touch. Bilateral finger-to-nose is normal with no signs of dysmetria. Patient has a normal gait and walks without  assistance. Dizziness is not exacerbated with sitting up in bed or turning head.  Skin: Skin is warm and dry. No rash noted. No erythema.  Psychiatric:  Anxious    ED Course  Procedures (including critical care time) DIAGNOSTIC STUDIES: Oxygen Saturation is 100% on RA, normal by my interpretation.    COORDINATION OF CARE: 12:30 AM- Pt advised of plan for treatment and pt agrees.  Labs Review Labs Reviewed  URINALYSIS, ROUTINE W REFLEX MICROSCOPIC  PREGNANCY, URINE    Imaging Review No results found.   EKG Interpretation None      MDM   Final diagnoses:  None    I personally performed the services described in this documentation, which was scribed in my presence. The recorded information has been reviewed and is accurate.  Patient's symptoms have completely resolved with IV fluids and treatment for her migraine. Low suspicion for vertigo given her  physical exam. To be discharged home to follow with primary Dr. Return precautions have been given patient voiced understanding.   Loren Raceravid Noble Cicalese, MD 12/08/13 [id number]

## 2013-12-09 ENCOUNTER — Ambulatory Visit (HOSPITAL_COMMUNITY): Payer: Self-pay | Admitting: Psychiatry

## 2013-12-10 ENCOUNTER — Ambulatory Visit (HOSPITAL_COMMUNITY): Payer: Self-pay | Admitting: Psychiatry

## 2013-12-14 NOTE — Discharge Instructions (Signed)

## 2014-01-19 ENCOUNTER — Encounter (HOSPITAL_BASED_OUTPATIENT_CLINIC_OR_DEPARTMENT_OTHER): Payer: Self-pay | Admitting: Emergency Medicine

## 2014-01-19 ENCOUNTER — Emergency Department (HOSPITAL_BASED_OUTPATIENT_CLINIC_OR_DEPARTMENT_OTHER)
Admission: EM | Admit: 2014-01-19 | Discharge: 2014-01-19 | Disposition: A | Discharge status: Home / Self Care | Payer: BC Managed Care – PPO | Attending: Emergency Medicine | Admitting: Emergency Medicine

## 2014-01-19 DIAGNOSIS — N390 Urinary tract infection, site not specified: Secondary | ICD-10-CM

## 2014-01-19 DIAGNOSIS — F329 Major depressive disorder, single episode, unspecified: Secondary | ICD-10-CM | POA: Diagnosis not present

## 2014-01-19 DIAGNOSIS — B5809 Other toxoplasma oculopathy: Secondary | ICD-10-CM | POA: Diagnosis not present

## 2014-01-19 DIAGNOSIS — Z3202 Encounter for pregnancy test, result negative: Secondary | ICD-10-CM | POA: Insufficient documentation

## 2014-01-19 DIAGNOSIS — F411 Generalized anxiety disorder: Secondary | ICD-10-CM | POA: Diagnosis not present

## 2014-01-19 DIAGNOSIS — I10 Essential (primary) hypertension: Secondary | ICD-10-CM | POA: Insufficient documentation

## 2014-01-19 DIAGNOSIS — F3289 Other specified depressive episodes: Secondary | ICD-10-CM | POA: Diagnosis not present

## 2014-01-19 DIAGNOSIS — Z87442 Personal history of urinary calculi: Secondary | ICD-10-CM | POA: Diagnosis not present

## 2014-01-19 DIAGNOSIS — Z79899 Other long term (current) drug therapy: Secondary | ICD-10-CM | POA: Diagnosis not present

## 2014-01-19 DIAGNOSIS — R1011 Right upper quadrant pain: Secondary | ICD-10-CM | POA: Insufficient documentation

## 2014-01-19 DIAGNOSIS — B9689 Other specified bacterial agents as the cause of diseases classified elsewhere: Secondary | ICD-10-CM | POA: Diagnosis not present

## 2014-01-19 DIAGNOSIS — J45909 Unspecified asthma, uncomplicated: Secondary | ICD-10-CM | POA: Diagnosis not present

## 2014-01-19 DIAGNOSIS — A499 Bacterial infection, unspecified: Secondary | ICD-10-CM | POA: Diagnosis not present

## 2014-01-19 DIAGNOSIS — N760 Acute vaginitis: Secondary | ICD-10-CM | POA: Diagnosis not present

## 2014-01-19 DIAGNOSIS — G43909 Migraine, unspecified, not intractable, without status migrainosus: Secondary | ICD-10-CM | POA: Insufficient documentation

## 2014-01-19 DIAGNOSIS — Z8719 Personal history of other diseases of the digestive system: Secondary | ICD-10-CM | POA: Diagnosis not present

## 2014-01-19 DIAGNOSIS — R112 Nausea with vomiting, unspecified: Secondary | ICD-10-CM | POA: Diagnosis not present

## 2014-01-19 DIAGNOSIS — Z880 Allergy status to penicillin: Secondary | ICD-10-CM | POA: Insufficient documentation

## 2014-01-19 LAB — URINALYSIS, ROUTINE W REFLEX MICROSCOPIC
Bilirubin Urine: NEGATIVE
GLUCOSE, UA: NEGATIVE mg/dL
KETONES UR: NEGATIVE mg/dL
Nitrite: NEGATIVE
Protein, ur: NEGATIVE mg/dL
Specific Gravity, Urine: 1.024 (ref 1.005–1.030)
UROBILINOGEN UA: 0.2 mg/dL (ref 0.0–1.0)
pH: 5 (ref 5.0–8.0)

## 2014-01-19 LAB — URINE MICROSCOPIC-ADD ON

## 2014-01-19 LAB — COMPREHENSIVE METABOLIC PANEL
ALT: 18 U/L (ref 0–35)
AST: 13 U/L (ref 0–37)
Albumin: 3.7 g/dL (ref 3.5–5.2)
Alkaline Phosphatase: 53 U/L (ref 39–117)
Anion gap: 13 (ref 5–15)
BUN: 10 mg/dL (ref 6–23)
CALCIUM: 9.2 mg/dL (ref 8.4–10.5)
CO2: 26 mEq/L (ref 19–32)
Chloride: 102 mEq/L (ref 96–112)
Creatinine, Ser: 0.9 mg/dL (ref 0.50–1.10)
GFR calc Af Amer: >90 mL/min (ref >90)
GFR calc non Af Amer: 87 mL/min — ABNORMAL LOW (ref >90)
Glucose, Bld: 100 mg/dL — ABNORMAL HIGH (ref 70–99)
Potassium: 4.1 mEq/L (ref 3.7–5.3)
SODIUM: 141 meq/L (ref 137–147)
TOTAL PROTEIN: 7.3 g/dL (ref 6.0–8.3)
Total Bilirubin: 0.2 mg/dL — ABNORMAL LOW (ref 0.3–1.2)

## 2014-01-19 LAB — CBC WITH DIFFERENTIAL/PLATELET
BASOS ABS: 0 10*3/uL (ref 0.0–0.1)
BASOS PCT: 0 % (ref 0–1)
EOS ABS: 0.2 10*3/uL (ref 0.0–0.7)
EOS PCT: 2 % (ref 0–5)
HEMATOCRIT: 42.6 % (ref 36.0–46.0)
Hemoglobin: 14.4 g/dL (ref 12.0–15.0)
Lymphocytes Relative: 26 % (ref 12–46)
Lymphs Abs: 2.6 10*3/uL (ref 0.7–4.0)
MCH: 30.4 pg (ref 26.0–34.0)
MCHC: 33.8 g/dL (ref 30.0–36.0)
MCV: 90.1 fL (ref 78.0–100.0)
MONO ABS: 1.1 10*3/uL — AB (ref 0.1–1.0)
Monocytes Relative: 11 % (ref 3–12)
Neutro Abs: 6.2 10*3/uL (ref 1.7–7.7)
Neutrophils Relative %: 61 % (ref 43–77)
PLATELETS: 303 10*3/uL (ref 150–400)
RBC: 4.73 MIL/uL (ref 3.87–5.11)
RDW: 12.4 % (ref 11.5–15.5)
WBC: 10.1 10*3/uL (ref 4.0–10.5)

## 2014-01-19 LAB — PREGNANCY, URINE: Preg Test, Ur: NEGATIVE

## 2014-01-19 LAB — LIPASE, BLOOD: Lipase: 22 U/L (ref 11–59)

## 2014-01-19 MED ORDER — MORPHINE SULFATE 4 MG/ML IJ SOLN
4.0000 mg | Freq: Once | INTRAMUSCULAR | Status: AC
  Administered 2014-01-19: 4 mg via INTRAVENOUS
  Filled 2014-01-19: qty 1

## 2014-01-19 MED ORDER — ONDANSETRON HCL 4 MG/2ML IJ SOLN
4.0000 mg | Freq: Once | INTRAMUSCULAR | Status: AC
  Administered 2014-01-19: 4 mg via INTRAVENOUS
  Filled 2014-01-19: qty 2

## 2014-01-19 MED ORDER — HYDROCODONE-ACETAMINOPHEN 5-325 MG PO TABS: 1.0000 | ORAL_TABLET | Freq: Four times a day (QID) | ORAL | Status: DC | PRN

## 2014-01-19 MED ORDER — METRONIDAZOLE 0.75 % VA GEL: 1.0000 | Freq: Every day | VAGINAL | Status: DC

## 2014-01-19 MED ORDER — NAPROXEN 500 MG PO TABS: 500.0000 mg | ORAL_TABLET | Freq: Two times a day (BID) | ORAL | Status: DC | PRN

## 2014-01-19 MED ORDER — ONDANSETRON 4 MG PO TBDP: 4.0000 mg | ORAL_TABLET | Freq: Three times a day (TID) | ORAL | Status: DC | PRN

## 2014-01-19 MED ORDER — NITROFURANTOIN MONOHYD MACRO 100 MG PO CAPS: 100.0000 mg | ORAL_CAPSULE | Freq: Two times a day (BID) | ORAL | Status: DC

## 2014-01-19 NOTE — ED Notes (Signed)
N/v x 6 since Sunday- also epigastric pain- hx of gallstones

## 2014-01-19 NOTE — ED Notes (Signed)
Discussed at length with patient concerning having her gallbladder taken out. Pt reports concerns about the surgery. This RN re-iterated that having her surgery would be most beneficial.  Pt tolerating POs at this time.

## 2014-01-19 NOTE — Discharge Instructions (Signed)
Your labs were all normal, but you need to have your gallbladder out. See the surgeon as soon as possible. Avoid fatty foods!! Your urine showed possible UTI, and bacterial vaginosis, therefore use the metrogel and macrobid as directed. Stay very well hydrated with plenty of water throughout the day. Follow up with primary care physician in 1 week for recheck of ongoing symptoms but return to ER for emergent changing or worsening of symptoms. Please seek immediate care if you develop the following: You develop back pain.  Your symptoms are no better, or worse in 3 days. There is severe back pain or lower abdominal pain.  You develop chills.  You have a fever.  There is nausea or vomiting.  There is continued burning or discomfort with urination.    Abdominal Pain Many things can cause belly (abdominal) pain. Most times, the belly pain is not dangerous. Many cases of belly pain can be watched and treated at home. HOME CARE   Do not take medicines that help you go poop (laxatives) unless told to by your doctor.  Only take medicine as told by your doctor.  Eat or drink as told by your doctor. Your doctor will tell you if you should be on a special diet. GET HELP IF:  You do not know what is causing your belly pain.  You have belly pain while you are sick to your stomach (nauseous) or have runny poop (diarrhea).  You have pain while you pee or poop.  Your belly pain wakes you up at night.  You have belly pain that gets worse or better when you eat.  You have belly pain that gets worse when you eat fatty foods.  You have a fever. GET HELP RIGHT AWAY IF:   The pain does not go away within 2 hours.  You keep throwing up (vomiting).  The pain changes and is only in the right or left part of the belly.  You have bloody or tarry looking poop. MAKE SURE YOU:   Understand these instructions.  Will watch your condition.  Will get help right away if you are not doing well or get  worse. Document Released: 10/03/2007 Document Revised: 04/21/2013 Document Reviewed: 12/24/2012 [facility] Patient Information 2015 [place], [place]. This information is not intended to replace advice given to you by your health care provider. Make sure you discuss any questions you have with your health care provider.  Bacterial Vaginosis Bacterial vaginosis is a vaginal infection that occurs when the normal balance of bacteria in the vagina is disrupted. It results from an overgrowth of certain bacteria. This is the most common vaginal infection in women of childbearing age. Treatment is important to prevent complications, especially in pregnant women, as it can cause a premature delivery. CAUSES  Bacterial vaginosis is caused by an increase in harmful bacteria that are normally present in smaller amounts in the vagina. Several different kinds of bacteria can cause bacterial vaginosis. However, the reason that the condition develops is not fully understood. RISK FACTORS Certain activities or behaviors can put you at an increased risk of developing bacterial vaginosis, including:  Having a new sex partner or multiple sex partners.  Douching.  Using an intrauterine device (IUD) for contraception. Women do not get bacterial vaginosis from toilet seats, bedding, swimming pools, or contact with objects around them. SIGNS AND SYMPTOMS  Some women with bacterial vaginosis have no signs or symptoms. Common symptoms include:  Grey vaginal discharge.  A fishlike odor with discharge, especially  after sexual intercourse.  Itching or burning of the vagina and vulva.  Burning or pain with urination. DIAGNOSIS  Your health care provider will take a medical history and examine the vagina for signs of bacterial vaginosis. A sample of vaginal fluid may be taken. Your health care provider will look at this sample under a microscope to check for bacteria and abnormal cells. A vaginal pH test may also be done.   TREATMENT  Bacterial vaginosis may be treated with antibiotic medicines. These may be given in the form of a pill or a vaginal cream. A second round of antibiotics may be prescribed if the condition comes back after treatment.  HOME CARE INSTRUCTIONS   Only take over-the-counter or prescription medicines as directed by your health care provider.  If antibiotic medicine was prescribed, take it as directed. Make sure you finish it even if you start to feel better.  Do not have sex until treatment is completed.  Tell all sexual partners that you have a vaginal infection. They should see their health care provider and be treated if they have problems, such as a mild rash or itching.  Practice safe sex by using condoms and only having one sex partner. SEEK MEDICAL CARE IF:   Your symptoms are not improving after 3 days of treatment.  You have increased discharge or pain.  You have a fever. MAKE SURE YOU:   Understand these instructions.  Will watch your condition.  Will get help right away if you are not doing well or get worse. FOR MORE INFORMATION  Centers for Disease Control and Prevention, Division of STD Prevention: [employer] American Sexual Health Association (ASHA): www.ashastd.org  Document Released: 04/16/2005 Document Revised: 02/04/2013 Document Reviewed: 11/26/2012 [facility] Patient Information 2015 [place], [place]. This information is not intended to replace advice given to you by your health care provider. Make sure you discuss any questions you have with your health care provider.  Biliary Colic  Biliary colic is a steady or irregular pain in the upper abdomen. It is usually under the right side of the rib cage. It happens when gallstones interfere with the normal flow of bile from the gallbladder. Bile is a liquid that helps to digest fats. Bile is made in the liver and stored in the gallbladder. When you eat a meal, bile passes from the gallbladder through the  cystic duct and the common bile duct into the small intestine. There, it mixes with partially digested food. If a gallstone blocks either of these ducts, the normal flow of bile is blocked. The muscle cells in the bile duct contract forcefully to try to move the stone. This causes the pain of biliary colic.  SYMPTOMS   A person with biliary colic usually complains of pain in the upper abdomen. This pain can be:  In the center of the upper abdomen just below the breastbone.  In the upper-right part of the abdomen, near the gallbladder and liver.  Spread back toward the right shoulder blade.  Nausea and vomiting.  The pain usually occurs after eating.  Biliary colic is usually triggered by the digestive system's demand for bile. The demand for bile is high after fatty meals. Symptoms can also occur when a person who has been fasting suddenly eats a very large meal. Most episodes of biliary colic pass after 1 to 5 hours. After the most intense pain passes, your abdomen may continue to ache mildly for about 24 hours. DIAGNOSIS  After you describe your symptoms, your  caregiver will perform a physical exam. He or she will pay attention to the upper right portion of your belly (abdomen). This is the area of your liver and gallbladder. An ultrasound will help your caregiver look for gallstones. Specialized scans of the gallbladder may also be done. Blood tests may be done, especially if you have fever or if your pain persists. PREVENTION  Biliary colic can be prevented by controlling the risk factors for gallstones. Some of these risk factors, such as heredity, increasing age, and pregnancy are a normal part of life. Obesity and a high-fat diet are risk factors you can change through a healthy lifestyle. Women going through menopause who take hormone replacement therapy (estrogen) are also more likely to develop biliary colic. TREATMENT   Pain medication may be prescribed.  You may be encouraged to  eat a fat-free diet.  If the first episode of biliary colic is severe, or episodes of colic keep retuning, surgery to remove the gallbladder (cholecystectomy) is usually recommended. This procedure can be done through small incisions using an instrument called a laparoscope. The procedure often requires a brief stay in the hospital. Some people can leave the hospital the same day. It is the most widely used treatment in people troubled by painful gallstones. It is effective and safe, with no complications in more than 90% of cases.  If surgery cannot be done, medication that dissolves gallstones may be used. This medication is expensive and can take months or years to work. Only small stones will dissolve.  Rarely, medication to dissolve gallstones is combined with a procedure called shock-wave lithotripsy. This procedure uses carefully aimed shock waves to break up gallstones. In many people treated with this procedure, gallstones form again within a few years. PROGNOSIS  If gallstones block your cystic duct or common bile duct, you are at risk for repeated episodes of biliary colic. There is also a 25% chance that you will develop a gallbladder infection(acute cholecystitis), or some other complication of gallstones within 10 to 20 years. If you have surgery, schedule it at a time that is convenient for you and at a time when you are not sick. HOME CARE INSTRUCTIONS   Drink plenty of clear fluids.  Avoid fatty, greasy or fried foods, or any foods that make your pain worse.  Take medications as directed. SEEK MEDICAL CARE IF:   You develop a fever over 100.5 F (38.1 C).  Your pain gets worse over time.  You develop nausea that prevents you from eating and drinking.  You develop vomiting. SEEK IMMEDIATE MEDICAL CARE IF:   You have continuous or severe belly (abdominal) pain which is not relieved with medications.  You develop nausea and vomiting which is not relieved with  medications.  You have symptoms of biliary colic and you suddenly develop a fever and shaking chills. This may signal cholecystitis. Call your caregiver immediately.  You develop a yellow color to your skin or the white part of your eyes (jaundice). Document Released: 09/17/2005 Document Revised: 07/09/2011 Document Reviewed: 11/27/2007 [facility] Patient Information 2015 [place], [place]. This information is not intended to replace advice given to you by your health care provider. Make sure you discuss any questions you have with your health care provider.  Nausea and Vomiting Nausea means you feel sick to your stomach. Throwing up (vomiting) is a reflex where stomach contents come out of your mouth. HOME CARE   Take medicine as told by your doctor.  Do not force yourself to  eat. However, you do need to drink fluids.  If you feel like eating, eat a normal diet as told by your doctor.  Eat rice, wheat, potatoes, bread, lean meats, yogurt, fruits, and vegetables.  Avoid high-fat foods.  Drink enough fluids to keep your pee (urine) clear or pale yellow.  Ask your doctor how to replace body fluid losses (rehydrate). Signs of body fluid loss (dehydration) include:  Feeling very thirsty.  Dry lips and mouth.  Feeling dizzy.  Dark pee.  Peeing less than normal.  Feeling confused.  Fast breathing or heart rate. GET HELP RIGHT AWAY IF:   You have blood in your throw up.  You have black or bloody poop (stool).  You have a bad headache or stiff neck.  You feel confused.  You have bad belly (abdominal) pain.  You have chest pain or trouble breathing.  You do not pee at least once every 8 hours.  You have cold, clammy skin.  You keep throwing up after 24 to 48 hours.  You have a fever. MAKE SURE YOU:   Understand these instructions.  Will watch your condition.  Will get help right away if you are not doing well or get worse. Document Released: 10/03/2007 Document  Revised: 07/09/2011 Document Reviewed: 09/15/2010 [facility] Patient Information 2015 [place], [place]. This information is not intended to replace advice given to you by your health care provider. Make sure you discuss any questions you have with your health care provider.  Urinary Tract Infection Urinary tract infections (UTIs) can develop anywhere along your urinary tract. Your urinary tract is your body's drainage system for removing wastes and extra water. Your urinary tract includes two kidneys, two ureters, a bladder, and a urethra. Your kidneys are a pair of bean-shaped organs. Each kidney is about the size of your fist. They are located below your ribs, one on each side of your spine. CAUSES Infections are caused by microbes, which are microscopic organisms, including fungi, viruses, and bacteria. These organisms are so small that they can only be seen through a microscope. Bacteria are the microbes that most commonly cause UTIs. SYMPTOMS  Symptoms of UTIs may vary by age and gender of the patient and by the location of the infection. Symptoms in young women typically include a frequent and intense urge to urinate and a painful, burning feeling in the bladder or urethra during urination. Older women and men are more likely to be tired, shaky, and weak and have muscle aches and abdominal pain. A fever may mean the infection is in your kidneys. Other symptoms of a kidney infection include pain in your back or sides below the ribs, nausea, and vomiting. DIAGNOSIS To diagnose a UTI, your caregiver will ask you about your symptoms. Your caregiver also will ask to provide a urine sample. The urine sample will be tested for bacteria and white blood cells. White blood cells are made by your body to help fight infection. TREATMENT  Typically, UTIs can be treated with medication. Because most UTIs are caused by a bacterial infection, they usually can be treated with the use of antibiotics. The choice of  antibiotic and length of treatment depend on your symptoms and the type of bacteria causing your infection. HOME CARE INSTRUCTIONS  If you were prescribed antibiotics, take them exactly as your caregiver instructs you. Finish the medication even if you feel better after you have only taken some of the medication.  Drink enough water and fluids to keep your urine clear  or pale yellow.  Avoid caffeine, tea, and carbonated beverages. They tend to irritate your bladder.  Empty your bladder often. Avoid holding urine for long periods of time.  Empty your bladder before and after sexual intercourse.  After a bowel movement, women should cleanse from front to back. Use each tissue only once. SEEK MEDICAL CARE IF:   You have back pain.  You develop a fever.  Your symptoms do not begin to resolve within 3 days. SEEK IMMEDIATE MEDICAL CARE IF:   You have severe back pain or lower abdominal pain.  You develop chills.  You have nausea or vomiting.  You have continued burning or discomfort with urination. MAKE SURE YOU:   Understand these instructions.  Will watch your condition.  Will get help right away if you are not doing well or get worse. Document Released: 01/24/2005 Document Revised: 10/16/2011 Document Reviewed: 05/25/2011 [facility] Patient Information 2015 [place], [place]. This information is not intended to replace advice given to you by your health care provider. Make sure you discuss any questions you have with your health care provider.

## 2014-01-19 NOTE — ED Provider Notes (Signed)
CSN: [id number]     Arrival date & time 01/19/14  1419 History   First MD Initiated Contact with Patient 01/19/14 1423     Chief Complaint  Patient presents with  . Emesis     (Consider location/radiation/quality/duration/timing/severity/associated sxs/prior Treatment) HPI Comments: Carol [name] is a 28 y.o. female with a PMHx of anxiety, depression, HTN, frequent UTIs, IBS, migraine HA, NAFLD, nephrolithiasis, and cholelithiasis, who presents to the ED today with complaints of intermittent "pressure-like" 10/10 epigastric and RUQ abd pain with nonbloody nonbilious N/V x2 days. Pt reports that pain is similar to prior "gallstone attacks" and radiates into her back and L shoulder, waxes and wanes, worse with fatty food intake (last meal: dinner at [employer]- fried food), improved with massage of back, tylenol, and advil. Pt states she's also had fluctuating diarrhea and constipation which is typical for her. Also endorses malodorous cloudy urine, increased urinary frequency, and belching. Denies fevers, chills, URI symptoms, CP, SOB, cough, hematemesis, hematochezia, melena, dysuria, hematuria, obstipation, vaginal discharge/bleeding, recent travel, NSAID use, EtOH use, or suspicious food intake. Denies chance of pregnancy, states last sexual encounter with a female is >53yr ago  Patient is a 28 y.o. female presenting with vomiting. The history is provided by the patient. No language interpreter was used.  Emesis Severity:  Moderate Duration:  2 days Timing:  Constant Number of daily episodes:  6 episodes since Sunday Quality:  Stomach contents Able to tolerate:  Liquids Onset of vomiting after eating: several hours. Progression:  Unchanged Chronicity:  Recurrent Recent urination:  Normal Relieved by:  None tried Exacerbated by: fatty foods. Ineffective treatments:  None tried Associated symptoms: abdominal pain (epigastric and RUQ)   Associated symptoms: no arthralgias, no chills, no  cough, no diarrhea, no fever, no headaches, no myalgias, no sore throat and no URI   Abdominal pain:    Location:  Epigastric and RUQ   Quality:  Pressure   Severity:  Severe (10/10)   Onset quality:  Sudden   Duration:  2 days   Timing:  Intermittent   Progression:  Waxing and waning   Chronicity:  Recurrent (hx of gall stones) Risk factors: no alcohol use, not pregnant now, no prior abdominal surgery, no sick contacts and no suspect food intake   Risk factors comment:  Hx of gall stones, recent fatty food ingestion   Past Medical History  Diagnosis Date  . Bronchitis   . Anxiety   . Hypertension   . Urinary tract infection   . Irritable bowel   . Depression   . Asthma   . Increased frequency of headaches   . Fatty liver disease, nonalcoholic   . Migraines   . Kidney stones   . Gall stones    Past Surgical History  Procedure Laterality Date  . No past surgeries     Family History  Problem Relation Age of Onset  . Physical abuse Mother   . Anxiety disorder Mother   . ADD / ADHD Mother   . Paranoid behavior Mother   . Drug abuse Father   . Depression Father   . Alcohol abuse Maternal Grandfather   . ADD / ADHD Maternal Grandmother   . Anxiety disorder Maternal Grandmother   . Dementia Maternal Grandmother   . OCD Maternal Grandmother   . Sexual abuse Maternal Grandmother   . Alcohol abuse Paternal Grandfather   . Bipolar disorder Cousin   . Schizophrenia Neg Hx   . Seizures Neg Hx  History  Substance Use Topics  . Smoking status: Current Every Day Smoker -- 1.00 packs/day    Types: Cigarettes  . Smokeless tobacco: Never Used  . Alcohol Use: No   OB History   Grav Para Term Preterm Abortions TAB SAB Ect Mult Living                 Review of Systems  Constitutional: Negative for fever and chills.  HENT: Negative for sore throat.   Respiratory: Negative for cough and shortness of breath.   Cardiovascular: Negative for chest pain.  Gastrointestinal:  Positive for nausea, vomiting and abdominal pain (epigastric and RUQ). Negative for diarrhea, constipation, blood in stool, abdominal distention, anal bleeding and rectal pain.  Genitourinary: Positive for frequency. Negative for dysuria, urgency, hematuria, flank pain, decreased urine volume, vaginal bleeding, vaginal discharge, difficulty urinating, vaginal pain, menstrual problem and pelvic pain.       +malodorous cloudy urine  Musculoskeletal: Negative for arthralgias and myalgias.  Skin: Negative for color change.  Neurological: Negative for weakness, light-headedness and headaches.   10 Systems reviewed and are negative for acute change except as noted in the HPI.     Allergies  Sulfa antibiotics; Pseudoephedrine; Cephalosporins; Ciprocin-fluocin-procin; Ciprofloxacin hcl; Flagyl; Hydrochlorothiazide; and Penicillins  Home Medications   Prior to Admission medications   Medication Sig Start Date End Date Taking? Authorizing Provider  acetaminophen (TYLENOL) 325 MG tablet Take 650 mg by mouth every 6 (six) hours as needed for headache.    Historical Provider, MD  ALPRAZolam Prudy Feeler) 1 MG tablet Take 1 mg by mouth 3 (three) times daily as needed for anxiety. 10/29/13 10/29/14  Diannia Ruder, MD  buPROPion (WELLBUTRIN XL) 150 MG 24 hr tablet Take 1 tablet (150 mg total) by mouth every morning. 11/02/13 11/02/14  Beau Fanny, FNP  diphenhydrAMINE (BENADRYL) 25 MG tablet Take 1 tablet (25 mg total) by mouth every 6 (six) hours as needed (nausea/headache). 12/08/13   Loren Racer, MD  HYDROcodone-acetaminophen (NORCO/VICODIN) 5-325 MG per tablet Take 2 tablets by mouth every 4 (four) hours as needed for moderate pain or severe pain. 09/23/13   Rolland Porter, MD  ibuprofen (ADVIL,MOTRIN) 200 MG tablet Take 400 mg by mouth every 6 (six) hours as needed for headache.    Historical Provider, MD  ibuprofen (ADVIL,MOTRIN) 600 MG tablet Take 1 tablet (600 mg total) by mouth every 6 (six) hours as needed.  12/08/13   Loren Racer, MD  metoCLOPramide (REGLAN) 10 MG tablet Take 1 tablet (10 mg total) by mouth every 8 (eight) hours as needed for nausea. 12/08/13   Loren Racer, MD  nitrofurantoin, macrocrystal-monohydrate, (MACROBID) 100 MG capsule Take 1 capsule (100 mg total) by mouth 2 (two) times daily. 11/27/13   Ethelda Chick, MD  ondansetron (ZOFRAN ODT) 4 MG disintegrating tablet Take 1 tablet (4 mg total) by mouth every 8 (eight) hours as needed for nausea or vomiting. 11/27/13   Ethelda Chick, MD  ondansetron (ZOFRAN-ODT) 4 MG disintegrating tablet Take 4 mg by mouth every 8 (eight) hours as needed for nausea. 09/23/13   Rolland Porter, MD   BP 131/98  Pulse 83  Temp(Src) 98.6 F (37 C) (Oral)  Resp 20  Ht  (1.676 m)  Wt 210 lb (95.255 kg)  BMI 33.91 kg/m2  SpO2 99% Physical Exam  Nursing note and vitals reviewed. Constitutional: She is oriented to person, place, and time. Vital signs are normal. She appears well-developed and well-nourished. No distress.  VSS,  afebrile, well appearing  HENT:  Head: Normocephalic and atraumatic.  Mouth/Throat: Mucous membranes are normal.  Eyes: Conjunctivae and EOM are normal. Right eye exhibits no discharge. Left eye exhibits no discharge.  Neck: Normal range of motion. Neck supple.  Cardiovascular: Normal rate, regular rhythm, normal heart sounds and intact distal pulses.  Exam reveals no gallop and no friction rub.   No murmur heard. Pulmonary/Chest: Effort normal and breath sounds normal. No respiratory distress. She has no decreased breath sounds. She has no wheezes. She has no rhonchi. She has no rales.  Abdominal: Soft. Normal appearance and bowel sounds are normal. She exhibits no distension. There is tenderness in the right upper quadrant and epigastric area. There is no rigidity, no rebound, no guarding, no CVA tenderness, no tenderness at McBurney's point and negative Murphy's sign.    Soft, obese but nondistended, +BS throughout,  mild TTP in RUQ and epigastrum with negative Murphy's sign, no r/g/r, neg mcburney's point, no CVA TTP  Musculoskeletal: Normal range of motion.  Neurological: She is alert and oriented to person, place, and time.  Skin: Skin is warm, dry and intact. No rash noted.  Psychiatric: She has a normal mood and affect.    ED Course  Procedures (including critical care time) Labs Review Labs Reviewed  URINALYSIS, ROUTINE W REFLEX MICROSCOPIC - Abnormal; Notable for the following:    APPearance TURBID (*)    Hgb urine dipstick TRACE (*)    Leukocytes, UA LARGE (*)    All other components within normal limits  CBC WITH DIFFERENTIAL - Abnormal; Notable for the following:    Monocytes Absolute 1.1 (*)    All other components within normal limits  COMPREHENSIVE METABOLIC PANEL - Abnormal; Notable for the following:    Glucose, Bld 100 (*)    Total Bilirubin 0.2 (*)    GFR calc non Af Amer 87 (*)    All other components within normal limits  URINE MICROSCOPIC-ADD ON - Abnormal; Notable for the following:    Squamous Epithelial / LPF MANY (*)    Bacteria, UA MANY (*)    All other components within normal limits  PREGNANCY, URINE  LIPASE, BLOOD    Imaging Review No results found. U/S Abd Complete on 08/14/13: multiple echogenic foci which move  and shadow consistent with gallstones. Largest gallstone measures  1.0 x 0.4 x 0.7 cm. There is no gallbladder wall thickening or  pericholecystic fluid.    EKG Interpretation None      MDM   Final diagnoses:  Right upper quadrant pain  Non-intractable vomiting with nausea, vomiting of unspecified type  UTI (lower urinary tract infection)  BV (bacterial vaginosis)    28y/o female with hx of gallstones, here with pain and n/v similar to prior attacks, states she ate fast food last night. Afebrile and relatively benign abd exam, no peritoneal signs. Last U/S April 2015 showing cholelithiasis. Doubt cholecystitis at this time but will await  labs to decide on U/S or further imaging. Will obtain basic labs and urine labs, give fluids/pain meds/zofran and reassess.   5:00 PM CBC with diff unremarkable, CMP unremarkable, Lipase neg, Upreg neg, U/A poor catch but +leuks w/ 11-20WBC, many bacteria, and +clue cells could indicate vaginal contaminant or possible UTI. Will treat for both today given that pt is symptomatic. Will use flagyl suppository and nitrofurantoin due to allergies to most abx. No imaging at this time given labs all WNL, doubt cholecystitis or intraabd process. Did not perform vaginal  exam given that pt denies symptoms.  Pt PO challenged and tolerated well. Pain improved. Discussed that she needs to see the surgeon for GB removal. Will give antiemetics and pain control to go home with. I explained the diagnosis and have given explicit precautions to return to the ER including for any other new or worsening symptoms. The patient understands and accepts the medical plan as it's been dictated and I have answered their questions. Discharge instructions concerning home care and prescriptions have been given. The patient is STABLE and is discharged to home in good condition.  BP 123/72  Pulse 90  Temp(Src) 98.6 F (37 C) (Oral)  Resp 18  Ht  (1.676 m)  Wt 210 lb (95.255 kg)  BMI 33.91 kg/m2  SpO2 100%  LMP 12/22/2013  Meds ordered this encounter  Medications  . ondansetron (ZOFRAN) injection 4 mg    Sig:   . morphine 4 MG/ML injection 4 mg    Sig:   . morphine 4 MG/ML injection 4 mg    Sig:   . metroNIDAZOLE (METROGEL VAGINAL) 0.75 % vaginal gel    Sig: Place 1 Applicatorful vaginally at bedtime. X 5 days    Dispense:  70 g    Refill:  0    Order Specific Question:  Supervising Provider    Answer:  Eber Hong D [[id number]]  . nitrofurantoin, macrocrystal-monohydrate, (MACROBID) 100 MG capsule    Sig: Take 1 capsule (100 mg total) by mouth 2 (two) times daily. X 7 days    Dispense:  14 capsule    Refill:  0     Order Specific Question:  Supervising Provider    Answer:  Eber Hong D [[id number]]  . naproxen (NAPROSYN) 500 MG tablet    Sig: Take 1 tablet (500 mg total) by mouth 2 (two) times daily as needed for mild pain, moderate pain or headache (TAKE WITH MEALS.).    Dispense:  20 tablet    Refill:  0    Order Specific Question:  Supervising Provider    Answer:  Eber Hong D [[id number]]  . HYDROcodone-acetaminophen (NORCO) 5-325 MG per tablet    Sig: Take 1-2 tablets by mouth every 6 (six) hours as needed for severe pain.    Dispense:  6 tablet    Refill:  0    Order Specific Question:  Supervising Provider    Answer:  Eber Hong D [[id number]]  . ondansetron (ZOFRAN ODT) 4 MG disintegrating tablet    Sig: Take 1 tablet (4 mg total) by mouth every 8 (eight) hours as needed for nausea or vomiting.    Dispense:  15 tablet    Refill:  0    Order Specific Question:  Supervising Provider    Answer:  Vida Roller [id number] Camprubi-Soms, PA-C 01/19/14 1708

## 2014-01-20 NOTE — ED Provider Notes (Signed)
Medical screening examination/treatment/procedure(s) were performed by non-physician practitioner and as supervising physician I was immediately available for consultation/collaboration.   EKG Interpretation None        Kristen N Ward, DO 01/20/14 0705 

## 2014-01-30 ENCOUNTER — Other Ambulatory Visit (HOSPITAL_COMMUNITY): Payer: Self-pay | Admitting: Psychiatry

## 2014-02-03 ENCOUNTER — Ambulatory Visit (HOSPITAL_COMMUNITY): Payer: Self-pay | Admitting: Psychiatry

## 2014-02-23 ENCOUNTER — Ambulatory Visit (HOSPITAL_COMMUNITY): Payer: Self-pay | Admitting: Psychiatry

## 2014-03-04 ENCOUNTER — Emergency Department (HOSPITAL_BASED_OUTPATIENT_CLINIC_OR_DEPARTMENT_OTHER)
Admission: EM | Admit: 2014-03-04 | Discharge: 2014-03-04 | Disposition: A | Discharge status: Home / Self Care | Payer: BC Managed Care – PPO | Attending: Emergency Medicine | Admitting: Emergency Medicine

## 2014-03-04 ENCOUNTER — Encounter (HOSPITAL_BASED_OUTPATIENT_CLINIC_OR_DEPARTMENT_OTHER): Payer: Self-pay | Admitting: *Deleted

## 2014-03-04 DIAGNOSIS — J45909 Unspecified asthma, uncomplicated: Secondary | ICD-10-CM | POA: Insufficient documentation

## 2014-03-04 DIAGNOSIS — F329 Major depressive disorder, single episode, unspecified: Secondary | ICD-10-CM | POA: Insufficient documentation

## 2014-03-04 DIAGNOSIS — I10 Essential (primary) hypertension: Secondary | ICD-10-CM | POA: Insufficient documentation

## 2014-03-04 DIAGNOSIS — G43909 Migraine, unspecified, not intractable, without status migrainosus: Secondary | ICD-10-CM | POA: Insufficient documentation

## 2014-03-04 DIAGNOSIS — T63441A Toxic effect of venom of bees, accidental (unintentional), initial encounter: Secondary | ICD-10-CM | POA: Insufficient documentation

## 2014-03-04 DIAGNOSIS — R519 Headache, unspecified: Secondary | ICD-10-CM

## 2014-03-04 DIAGNOSIS — Z87442 Personal history of urinary calculi: Secondary | ICD-10-CM | POA: Insufficient documentation

## 2014-03-04 DIAGNOSIS — Z792 Long term (current) use of antibiotics: Secondary | ICD-10-CM | POA: Insufficient documentation

## 2014-03-04 DIAGNOSIS — T63481A Toxic effect of venom of other arthropod, accidental (unintentional), initial encounter: Secondary | ICD-10-CM

## 2014-03-04 DIAGNOSIS — Z87440 Personal history of urinary (tract) infections: Secondary | ICD-10-CM | POA: Insufficient documentation

## 2014-03-04 DIAGNOSIS — Z880 Allergy status to penicillin: Secondary | ICD-10-CM | POA: Insufficient documentation

## 2014-03-04 DIAGNOSIS — Z720 Tobacco use: Secondary | ICD-10-CM | POA: Insufficient documentation

## 2014-03-04 DIAGNOSIS — Y9289 Other specified places as the place of occurrence of the external cause: Secondary | ICD-10-CM | POA: Insufficient documentation

## 2014-03-04 DIAGNOSIS — F419 Anxiety disorder, unspecified: Secondary | ICD-10-CM | POA: Insufficient documentation

## 2014-03-04 DIAGNOSIS — Y9389 Activity, other specified: Secondary | ICD-10-CM | POA: Insufficient documentation

## 2014-03-04 DIAGNOSIS — Z8719 Personal history of other diseases of the digestive system: Secondary | ICD-10-CM | POA: Insufficient documentation

## 2014-03-04 DIAGNOSIS — Z79899 Other long term (current) drug therapy: Secondary | ICD-10-CM | POA: Insufficient documentation

## 2014-03-04 DIAGNOSIS — R51 Headache: Secondary | ICD-10-CM

## 2014-03-04 MED ORDER — SODIUM CHLORIDE 0.9 % IV BOLUS (SEPSIS)
1000.0000 mL | INTRAVENOUS | Status: AC
Start: 2014-03-04 — End: 2014-03-04
  Administered 2014-03-04: 1000 mL via INTRAVENOUS

## 2014-03-04 MED ORDER — METOCLOPRAMIDE HCL 5 MG/ML IJ SOLN
5.0000 mg | Freq: Once | INTRAMUSCULAR | Status: AC
  Administered 2014-03-04: 5 mg via INTRAVENOUS
  Filled 2014-03-04: qty 2

## 2014-03-04 MED ORDER — DIPHENHYDRAMINE HCL 50 MG/ML IJ SOLN
25.0000 mg | Freq: Once | INTRAMUSCULAR | Status: AC
  Administered 2014-03-04: 25 mg via INTRAVENOUS
  Filled 2014-03-04: qty 1

## 2014-03-04 MED ORDER — KETOROLAC TROMETHAMINE 30 MG/ML IJ SOLN
30.0000 mg | Freq: Once | INTRAMUSCULAR | Status: AC
  Administered 2014-03-04: 30 mg via INTRAVENOUS
  Filled 2014-03-04: qty 1

## 2014-03-04 MED ORDER — DEXAMETHASONE 4 MG PO TABS
8.0000 mg | ORAL_TABLET | Freq: Once | ORAL | Status: AC
  Administered 2014-03-04: 8 mg via ORAL
  Filled 2014-03-04: qty 2

## 2014-03-04 NOTE — ED Provider Notes (Signed)
CSN: [id number]     Arrival date & time 03/04/14  1238 History   First MD Initiated Contact with Patient 03/04/14 1246     Chief Complaint  Patient presents with  . Insect Bite     (Consider location/radiation/quality/duration/timing/severity/associated sxs/prior Treatment) Patient is a 28 y.o. female presenting with headaches. The history is provided by the patient.  Headache Location: vertex. Quality:  Dull Radiates to:  Does not radiate Severity currently:  6/10 Severity at highest:  8/10 Onset quality:  Gradual Duration:  1 day Timing:  Constant Progression:  Unchanged Chronicity:  New Similar to prior headaches: yes   Context comment:  At rest Relieved by:  Nothing Worsened by:  Nothing tried Ineffective treatments: advil and benadryl. Associated symptoms: no abdominal pain, no back pain, no congestion, no cough, no diarrhea, no dizziness, no pain, no fatigue, no fever, no nausea, no neck pain and no vomiting     Past Medical History  Diagnosis Date  . Bronchitis   . Anxiety   . Hypertension   . Urinary tract infection   . Irritable bowel   . Depression   . Asthma   . Increased frequency of headaches   . Fatty liver disease, nonalcoholic   . Migraines   . Kidney stones   . Gall stones    Past Surgical History  Procedure Laterality Date  . No past surgeries     Family History  Problem Relation Age of Onset  . Physical abuse Mother   . Anxiety disorder Mother   . ADD / ADHD Mother   . Paranoid behavior Mother   . Drug abuse Father   . Depression Father   . Alcohol abuse Maternal Grandfather   . ADD / ADHD Maternal Grandmother   . Anxiety disorder Maternal Grandmother   . Dementia Maternal Grandmother   . OCD Maternal Grandmother   . Sexual abuse Maternal Grandmother   . Alcohol abuse Paternal Grandfather   . Bipolar disorder Cousin   . Schizophrenia Neg Hx   . Seizures Neg Hx    History  Substance Use Topics  . Smoking status: Current Every Day  Smoker -- 1.00 packs/day    Types: Cigarettes  . Smokeless tobacco: Never Used  . Alcohol Use: No   OB History    No data available     Review of Systems  Constitutional: Negative for fever and fatigue.  HENT: Negative for congestion and drooling.   Eyes: Negative for pain.  Respiratory: Negative for cough and shortness of breath.   Cardiovascular: Negative for chest pain.  Gastrointestinal: Negative for nausea, vomiting, abdominal pain and diarrhea.  Genitourinary: Negative for dysuria and hematuria.  Musculoskeletal: Negative for back pain, gait problem and neck pain.  Skin: Negative for color change.  Neurological: Positive for headaches. Negative for dizziness.  Hematological: Negative for adenopathy.  Psychiatric/Behavioral: Negative for behavioral problems.  All other systems reviewed and are negative.     Allergies  Sulfa antibiotics; Pseudoephedrine; Cephalosporins; Ciprocin-fluocin-procin; Ciprofloxacin hcl; Flagyl; Hydrochlorothiazide; and Penicillins  Home Medications   Prior to Admission medications   Medication Sig Start Date End Date Taking? Authorizing Provider  acetaminophen (TYLENOL) 325 MG tablet Take 650 mg by mouth every 6 (six) hours as needed for headache.    Historical Provider, MD  ALPRAZolam Prudy Feeler(XANAX) 1 MG tablet Take 1 mg by mouth 3 (three) times daily as needed for anxiety. 10/29/13 10/29/14  Diannia Rudereborah Ross, MD  buPROPion (WELLBUTRIN XL) 150 MG 24 hr tablet Take  1 tablet (150 mg total) by mouth every morning. 11/02/13 11/02/14  Beau Fanny, FNP  diphenhydrAMINE (BENADRYL) 25 MG tablet Take 1 tablet (25 mg total) by mouth every 6 (six) hours as needed (nausea/headache). 12/08/13   Loren Racer, MD  HYDROcodone-acetaminophen (NORCO) 5-325 MG per tablet Take 1-2 tablets by mouth every 6 (six) hours as needed for severe pain. 01/19/14   Mercedes Strupp Camprubi-Soms, PA-C  HYDROcodone-acetaminophen (NORCO/VICODIN) 5-325 MG per tablet Take 2 tablets by mouth every  4 (four) hours as needed for moderate pain or severe pain. 09/23/13   Rolland Porter, MD  ibuprofen (ADVIL,MOTRIN) 200 MG tablet Take 400 mg by mouth every 6 (six) hours as needed for headache.    Historical Provider, MD  ibuprofen (ADVIL,MOTRIN) 600 MG tablet Take 1 tablet (600 mg total) by mouth every 6 (six) hours as needed. 12/08/13   Loren Racer, MD  metoCLOPramide (REGLAN) 10 MG tablet Take 1 tablet (10 mg total) by mouth every 8 (eight) hours as needed for nausea. 12/08/13   Loren Racer, MD  metroNIDAZOLE (METROGEL VAGINAL) 0.75 % vaginal gel Place 1 Applicatorful vaginally at bedtime. X 5 days 01/19/14   Donnita Falls Camprubi-Soms, PA-C  naproxen (NAPROSYN) 500 MG tablet Take 1 tablet (500 mg total) by mouth 2 (two) times daily as needed for mild pain, moderate pain or headache (TAKE WITH MEALS.). 01/19/14   [place]ita [place] Camprubi-Soms, PA-C  nitrofurantoin, macrocrystal-monohydrate, (MACROBID) 100 MG capsule Take 1 capsule (100 mg total) by mouth 2 (two) times daily. 11/27/13   Ethelda Chick, MD  nitrofurantoin, macrocrystal-monohydrate, (MACROBID) 100 MG capsule Take 1 capsule (100 mg total) by mouth 2 (two) times daily. X 7 days 01/19/14   Don[place]ita [place] Camprubi-Soms, PA-C  ondansetron (ZOFRAN ODT) 4 MG disintegrating tablet Take 1 tablet (4 mg total) by mouth every 8 (eight) hours as needed for nausea or vomiting. 11/27/13   Ethelda Chick, MD  ondansetron (ZOFRAN ODT) 4 MG disintegrating tablet Take 1 tablet (4 mg total) by mouth every 8 (eight) hours as needed for nausea or vomiting. 01/19/14   Donnita Falls Camprubi-Soms, PA-C  ondansetron (ZOFRAN-ODT) 4 MG disintegrating tablet Take 4 mg by mouth every 8 (eight) hours as needed for nausea. 09/23/13   Rolland Porter, MD   BP 121/84 mmHg  Pulse 92  Temp(Src) 98 F (36.7 C) (Oral)  Resp 18  Ht 5\' 6"  (1.676 m)  Wt 210 lb (95.255 kg)  BMI 33.91 kg/m2  SpO2 100%  LMP 02/25/2014 Physical Exam  Constitutional: She is oriented to  person, place, and time. She appears well-developed and well-nourished.  HENT:  Head: Normocephalic.  Mouth/Throat: No oropharyngeal exudate.  Eyes: Conjunctivae and EOM are normal. Pupils are equal, round, and reactive to light.  Neck: Normal range of motion. Neck supple.  Cardiovascular: Normal rate, regular rhythm, normal heart sounds and intact distal pulses.  Exam reveals no gallop and no friction rub.   No murmur heard. Pulmonary/Chest: Effort normal and breath sounds normal. No respiratory distress. She has no wheezes.  Abdominal: Soft. Bowel sounds are normal. There is no tenderness. There is no rebound and no guarding.  Musculoskeletal: Normal range of motion. She exhibits tenderness. She exhibits no edema.  Mild tenderness and swelling to the mid phalanx of the long finger on the right hand. No evidence of retained stinger. Moderate decrease in range of motion of this finger due to mild swelling and pain.    Neurological: She is alert and oriented to person,  place, and time.  alert, oriented x3 speech: normal in context and clarity memory: intact grossly cranial nerves II-XII: intact motor strength: full proximally and distally no involuntary movements or tremors sensation: intact to light touch diffusely  cerebellar: finger-to-nose and heel-to-shin intact gait: normal forwards and backwards  Skin: Skin is warm and dry.  Psychiatric: She has a normal mood and affect. Her behavior is normal.  Nursing note and vitals reviewed.   ED Course  Procedures (including critical care time) Labs Review Labs Reviewed - No data to display  Imaging Review No results found.   EKG Interpretation None      MDM   Final diagnoses:  Insect sting, accidental or unintentional, initial encounter  Headache, unspecified headache type    1:07 PM 28 y.o. female who presents with a honey bee sting which occurred to her right middle finger yesterday afternoon. She notes ongoing pain and  some mild redness and inflammation in this area. She also notes a gradual onset headache on the top of her head since yesterday evening. She does have a history of migraines. She denies any fevers or head injury. She is afebrile and vital signs are unremarkable here. Will treat symptomatically. LMP Feb 25 2014.   2:26 PM: HA improved. Will recommend symptomatic therapy for the bee sting and Benadryl as needed. I have discussed the diagnosis/risks/treatment options with the patient and believe the pt to be eligible for discharge home to follow-up with her pcp as needed. We also discussed returning to the ED immediately if new or worsening sx occur. We discussed the sx which are most concerning (e.g., worsening HA or spreading redness) that necessitate immediate return. Medications administered to the patient during their visit and any new prescriptions provided to the patient are listed below.  Medications given during this visit Medications  sodium chloride 0.9 % bolus 1,000 mL (1,000 mLs Intravenous New Bag/Given 03/04/14 1333)  metoCLOPramide (REGLAN) injection 5 mg (5 mg Intravenous Given 03/04/14 1333)  diphenhydrAMINE (BENADRYL) injection 25 mg (25 mg Intravenous Given 03/04/14 1338)  ketorolac (TORADOL) 30 MG/ML injection 30 mg (30 mg Intravenous Given 03/04/14 1339)  dexamethasone (DECADRON) tablet 8 mg (8 mg Oral Given 03/04/14 1331)    New Prescriptions   No medications on file     Purvis SheffieldForrest Mariona Scholes, MD 03/04/14 1434

## 2014-03-04 NOTE — ED Notes (Signed)
Pain in her right 3rd digit. She was stung by a bee yesterday.

## 2014-03-05 ENCOUNTER — Telehealth (HOSPITAL_COMMUNITY): Payer: Self-pay | Admitting: *Deleted

## 2014-03-05 NOTE — Telephone Encounter (Signed)
Pt pharmacy (kristen) calling stating pt is calling them requesting her Xanax. Per Pt file, she have not been in office since 10-29-13. Pt did make several appt since then (12-09-13, 02-03-14, and 02-23-14) but pt have been cancelling and no showing. Informed pharmacy that pt need to make an appt before more refills can be dispensed. Per Baxter HireKristen, she will inform pt.

## 2014-03-05 NOTE — Telephone Encounter (Signed)
agree

## 2014-03-11 ENCOUNTER — Telehealth (HOSPITAL_COMMUNITY): Payer: Self-pay | Admitting: *Deleted

## 2014-03-15 ENCOUNTER — Ambulatory Visit (HOSPITAL_COMMUNITY): Payer: Self-pay | Admitting: Psychiatry

## 2014-03-15 ENCOUNTER — Encounter (HOSPITAL_COMMUNITY): Payer: Self-pay | Admitting: Psychiatry

## 2014-03-25 ENCOUNTER — Emergency Department (HOSPITAL_BASED_OUTPATIENT_CLINIC_OR_DEPARTMENT_OTHER): Payer: Self-pay

## 2014-03-25 ENCOUNTER — Encounter (HOSPITAL_BASED_OUTPATIENT_CLINIC_OR_DEPARTMENT_OTHER): Payer: Self-pay

## 2014-03-25 ENCOUNTER — Emergency Department (HOSPITAL_BASED_OUTPATIENT_CLINIC_OR_DEPARTMENT_OTHER)
Admission: EM | Admit: 2014-03-25 | Discharge: 2014-03-25 | Disposition: A | Discharge status: Home / Self Care | Payer: Self-pay | Attending: Emergency Medicine | Admitting: Emergency Medicine

## 2014-03-25 DIAGNOSIS — J45909 Unspecified asthma, uncomplicated: Secondary | ICD-10-CM | POA: Insufficient documentation

## 2014-03-25 DIAGNOSIS — F419 Anxiety disorder, unspecified: Secondary | ICD-10-CM | POA: Insufficient documentation

## 2014-03-25 DIAGNOSIS — I10 Essential (primary) hypertension: Secondary | ICD-10-CM | POA: Insufficient documentation

## 2014-03-25 DIAGNOSIS — R112 Nausea with vomiting, unspecified: Secondary | ICD-10-CM | POA: Insufficient documentation

## 2014-03-25 DIAGNOSIS — Z880 Allergy status to penicillin: Secondary | ICD-10-CM | POA: Insufficient documentation

## 2014-03-25 DIAGNOSIS — Z79899 Other long term (current) drug therapy: Secondary | ICD-10-CM | POA: Insufficient documentation

## 2014-03-25 DIAGNOSIS — Z720 Tobacco use: Secondary | ICD-10-CM | POA: Insufficient documentation

## 2014-03-25 DIAGNOSIS — Z87442 Personal history of urinary calculi: Secondary | ICD-10-CM | POA: Insufficient documentation

## 2014-03-25 DIAGNOSIS — Z87440 Personal history of urinary (tract) infections: Secondary | ICD-10-CM | POA: Insufficient documentation

## 2014-03-25 DIAGNOSIS — Z3202 Encounter for pregnancy test, result negative: Secondary | ICD-10-CM | POA: Insufficient documentation

## 2014-03-25 DIAGNOSIS — R1011 Right upper quadrant pain: Secondary | ICD-10-CM

## 2014-03-25 DIAGNOSIS — F329 Major depressive disorder, single episode, unspecified: Secondary | ICD-10-CM | POA: Insufficient documentation

## 2014-03-25 DIAGNOSIS — M549 Dorsalgia, unspecified: Secondary | ICD-10-CM | POA: Insufficient documentation

## 2014-03-25 DIAGNOSIS — G43909 Migraine, unspecified, not intractable, without status migrainosus: Secondary | ICD-10-CM | POA: Insufficient documentation

## 2014-03-25 DIAGNOSIS — K8050 Calculus of bile duct without cholangitis or cholecystitis without obstruction: Secondary | ICD-10-CM | POA: Insufficient documentation

## 2014-03-25 LAB — COMPREHENSIVE METABOLIC PANEL
ALT: 23 U/L (ref 0–35)
ANION GAP: 15 (ref 5–15)
AST: 22 U/L (ref 0–37)
Albumin: 4.3 g/dL (ref 3.5–5.2)
Alkaline Phosphatase: 68 U/L (ref 39–117)
BILIRUBIN TOTAL: 0.4 mg/dL (ref 0.3–1.2)
BUN: 13 mg/dL (ref 6–23)
CO2: 25 mEq/L (ref 19–32)
Calcium: 9.4 mg/dL (ref 8.4–10.5)
Chloride: 101 mEq/L (ref 96–112)
Creatinine, Ser: 0.8 mg/dL (ref 0.50–1.10)
GFR calc non Af Amer: >90 mL/min (ref >90)
GLUCOSE: 89 mg/dL (ref 70–99)
POTASSIUM: 4.1 meq/L (ref 3.7–5.3)
Sodium: 141 mEq/L (ref 137–147)
TOTAL PROTEIN: 8.2 g/dL (ref 6.0–8.3)

## 2014-03-25 LAB — CBC WITH DIFFERENTIAL/PLATELET
Basophils Absolute: 0 10*3/uL (ref 0.0–0.1)
Basophils Relative: 0 % (ref 0–1)
EOS ABS: 0.1 10*3/uL (ref 0.0–0.7)
EOS PCT: 1 % (ref 0–5)
HCT: 46.3 % — ABNORMAL HIGH (ref 36.0–46.0)
HEMOGLOBIN: 15.6 g/dL — AB (ref 12.0–15.0)
LYMPHS PCT: 6 % — AB (ref 12–46)
Lymphs Abs: 0.8 10*3/uL (ref 0.7–4.0)
MCH: 30.2 pg (ref 26.0–34.0)
MCHC: 33.7 g/dL (ref 30.0–36.0)
MCV: 89.7 fL (ref 78.0–100.0)
MONOS PCT: 11 % (ref 3–12)
Monocytes Absolute: 1.4 10*3/uL — ABNORMAL HIGH (ref 0.1–1.0)
NEUTROS PCT: 82 % — AB (ref 43–77)
Neutro Abs: 10.3 10*3/uL — ABNORMAL HIGH (ref 1.7–7.7)
PLATELETS: 318 10*3/uL (ref 150–400)
RBC: 5.16 MIL/uL — AB (ref 3.87–5.11)
RDW: 12.4 % (ref 11.5–15.5)
WBC: 12.6 10*3/uL — AB (ref 4.0–10.5)

## 2014-03-25 LAB — URINALYSIS, ROUTINE W REFLEX MICROSCOPIC
Bilirubin Urine: NEGATIVE
Glucose, UA: NEGATIVE mg/dL
Hgb urine dipstick: NEGATIVE
Ketones, ur: 15 mg/dL — AB
Leukocytes, UA: NEGATIVE
Nitrite: NEGATIVE
PH: 6.5 (ref 5.0–8.0)
Protein, ur: NEGATIVE mg/dL
SPECIFIC GRAVITY, URINE: 1.03 (ref 1.005–1.030)
Urobilinogen, UA: 0.2 mg/dL (ref 0.0–1.0)

## 2014-03-25 LAB — LIPASE, BLOOD: Lipase: 25 U/L (ref 11–59)

## 2014-03-25 LAB — PREGNANCY, URINE: Preg Test, Ur: NEGATIVE

## 2014-03-25 MED ORDER — PROMETHAZINE HCL 25 MG PO TABS: 25.0000 mg | ORAL_TABLET | Freq: Four times a day (QID) | ORAL | Status: DC | PRN

## 2014-03-25 MED ORDER — SODIUM CHLORIDE 0.9 % IV BOLUS (SEPSIS)
1000.0000 mL | Freq: Once | INTRAVENOUS | Status: AC
Start: 2014-03-25 — End: 2014-03-25
  Administered 2014-03-25: 1000 mL via INTRAVENOUS

## 2014-03-25 MED ORDER — HYDROMORPHONE HCL 1 MG/ML IJ SOLN
1.0000 mg | Freq: Once | INTRAMUSCULAR | Status: AC
  Administered 2014-03-25: 1 mg via INTRAVENOUS
  Filled 2014-03-25: qty 1

## 2014-03-25 MED ORDER — ONDANSETRON HCL 4 MG/2ML IJ SOLN
4.0000 mg | Freq: Once | INTRAMUSCULAR | Status: AC
  Administered 2014-03-25: 4 mg via INTRAVENOUS
  Filled 2014-03-25: qty 2

## 2014-03-25 MED ORDER — MORPHINE SULFATE 4 MG/ML IJ SOLN
4.0000 mg | Freq: Once | INTRAMUSCULAR | Status: AC
  Administered 2014-03-25: 4 mg via INTRAVENOUS
  Filled 2014-03-25: qty 1

## 2014-03-25 MED ORDER — HYDROCODONE-ACETAMINOPHEN 5-325 MG PO TABS: 2.0000 | ORAL_TABLET | ORAL | Status: DC | PRN

## 2014-03-25 NOTE — ED Provider Notes (Signed)
CSN: [id number]     Arrival date & time 03/25/14  1634 History   First MD Initiated Contact with Patient 03/25/14 1705     Chief Complaint  Patient presents with  . Emesis     (Consider location/radiation/quality/duration/timing/severity/associated sxs/prior Treatment) HPI Comments: Patient presents with a 1 day history of right upper quadrant abdominal pain with nausea and vomiting. Similar to previous episodes of biliary colic. She denies any fevers but has had chills. She denies any urinary or vaginal symptoms. Pain is constant in her right upper abdomen worse with bending over and eating. It is better with laying still. No change in bowel habits. No lower abdominal pain. She has not yet followed up with a surgeon as instructed. She also has a history of kidney stones. Denies any dysuria or hematuria.  The history is provided by the patient.    Past Medical History  Diagnosis Date  . Bronchitis   . Anxiety   . Hypertension   . Urinary tract infection   . Irritable bowel   . Depression   . Asthma   . Increased frequency of headaches   . Fatty liver disease, nonalcoholic   . Migraines   . Kidney stones   . Gall stones    Past Surgical History  Procedure Laterality Date  . No past surgeries     Family History  Problem Relation Age of Onset  . Physical abuse Mother   . Anxiety disorder Mother   . ADD / ADHD Mother   . Paranoid behavior Mother   . Drug abuse Father   . Depression Father   . Alcohol abuse Maternal Grandfather   . ADD / ADHD Maternal Grandmother   . Anxiety disorder Maternal Grandmother   . Dementia Maternal Grandmother   . OCD Maternal Grandmother   . Sexual abuse Maternal Grandmother   . Alcohol abuse Paternal Grandfather   . Bipolar disorder Cousin   . Schizophrenia Neg Hx   . Seizures Neg Hx    History  Substance Use Topics  . Smoking status: Current Every Day Smoker -- 1.00 packs/day    Types: Cigarettes  . Smokeless tobacco: Never Used  .  Alcohol Use: No   OB History    No data available     Review of Systems  Constitutional: Negative for fever, activity change and appetite change.  HENT: Negative for congestion and rhinorrhea.   Eyes: Negative for visual disturbance.  Respiratory: Negative for cough and chest tightness.   Cardiovascular: Negative for chest pain.  Gastrointestinal: Positive for nausea, vomiting and abdominal pain.  Genitourinary: Negative for dysuria, hematuria, vaginal bleeding and vaginal discharge.  Musculoskeletal: Positive for back pain. Negative for myalgias.  Skin: Negative for rash.  Neurological: Negative for dizziness, seizures, syncope and weakness.  A complete 10 system review of systems was obtained and all systems are negative except as noted in the HPI and PMH.      Allergies  Sulfa antibiotics; Pseudoephedrine; Cephalosporins; Ciprocin-fluocin-procin; Ciprofloxacin hcl; Flagyl; Hydrochlorothiazide; and Penicillins  Home Medications   Prior to Admission medications   Medication Sig Start Date End Date Taking? Authorizing Provider  acetaminophen (TYLENOL) 325 MG tablet Take 650 mg by mouth every 6 (six) hours as needed for headache.    Historical Provider, MD  ALPRAZolam Prudy Feeler) 1 MG tablet Take 1 mg by mouth 3 (three) times daily as needed for anxiety. 10/29/13 10/29/14  Diannia Ruder, MD  buPROPion (WELLBUTRIN XL) 150 MG 24 hr tablet Take 1 tablet (  150 mg total) by mouth every morning. 11/02/13 11/02/14  Beau FannyJohn C Withrow, FNP  diphenhydrAMINE (BENADRYL) 25 MG tablet Take 1 tablet (25 mg total) by mouth every 6 (six) hours as needed (nausea/headache). 12/08/13   Loren Raceravid Yelverton, MD  HYDROcodone-acetaminophen (NORCO/VICODIN) 5-325 MG per tablet Take 2 tablets by mouth every 4 (four) hours as needed for moderate pain or severe pain. 09/23/13   Rolland PorterMark James, MD  HYDROcodone-acetaminophen (NORCO/VICODIN) 5-325 MG per tablet Take 2 tablets by mouth every 4 (four) hours as needed. 03/25/14   Glynn OctaveStephen  Aryaa Bunting, MD  ibuprofen (ADVIL,MOTRIN) 200 MG tablet Take 400 mg by mouth every 6 (six) hours as needed for headache.    Historical Provider, MD  ibuprofen (ADVIL,MOTRIN) 600 MG tablet Take 1 tablet (600 mg total) by mouth every 6 (six) hours as needed. 12/08/13   Loren Raceravid Yelverton, MD  metoCLOPramide (REGLAN) 10 MG tablet Take 1 tablet (10 mg total) by mouth every 8 (eight) hours as needed for nausea. 12/08/13   Loren Raceravid Yelverton, MD  metroNIDAZOLE (METROGEL VAGINAL) 0.75 % vaginal gel Place 1 Applicatorful vaginally at bedtime. X 5 days 01/19/14   Donnita FallsMercedes Strupp Camprubi-Soms, PA-C  naproxen (NAPROSYN) 500 MG tablet Take 1 tablet (500 mg total) by mouth 2 (two) times daily as needed for mild pain, moderate pain or headache (TAKE WITH MEALS.). 01/19/14   Donnita FallsMercedes Strupp Camprubi-Soms, PA-C  nitrofurantoin, macrocrystal-monohydrate, (MACROBID) 100 MG capsule Take 1 capsule (100 mg total) by mouth 2 (two) times daily. 11/27/13   Ethelda ChickMartha K Linker, MD  nitrofurantoin, macrocrystal-monohydrate, (MACROBID) 100 MG capsule Take 1 capsule (100 mg total) by mouth 2 (two) times daily. X 7 days 01/19/14   Donnita FallsMercedes Strupp Camprubi-Soms, PA-C  ondansetron (ZOFRAN ODT) 4 MG disintegrating tablet Take 1 tablet (4 mg total) by mouth every 8 (eight) hours as needed for nausea or vomiting. 11/27/13   Ethelda ChickMartha K Linker, MD  ondansetron (ZOFRAN ODT) 4 MG disintegrating tablet Take 1 tablet (4 mg total) by mouth every 8 (eight) hours as needed for nausea or vomiting. 01/19/14   Donnita FallsMercedes Strupp Camprubi-Soms, PA-C  ondansetron (ZOFRAN-ODT) 4 MG disintegrating tablet Take 4 mg by mouth every 8 (eight) hours as needed for nausea. 09/23/13   Rolland PorterMark James, MD  promethazine (PHENERGAN) 25 MG tablet Take 1 tablet (25 mg total) by mouth every 6 (six) hours as needed for nausea or vomiting. 03/25/14   Glynn OctaveStephen Arias Weinert, MD   BP 107/66 mmHg  Pulse 87  Temp(Src) 99.1 F (37.3 C) (Oral)  Resp 18  Wt 210 lb (95.255 kg)  SpO2 100%  LMP  02/25/2014 Physical Exam  Constitutional: She is oriented to person, place, and time. She appears well-developed and well-nourished. No distress.  HENT:  Head: Normocephalic and atraumatic.  Mouth/Throat: Oropharynx is clear and moist. No oropharyngeal exudate.  Eyes: Conjunctivae and EOM are normal. Pupils are equal, round, and reactive to light.  Neck: Normal range of motion. Neck supple.  No meningismus.  Cardiovascular: Normal rate, regular rhythm, normal heart sounds and intact distal pulses.   No murmur heard. Pulmonary/Chest: Effort normal and breath sounds normal. No respiratory distress.  Abdominal: Soft. There is tenderness. There is guarding. There is no rebound.  TTP RUQ with guarding.  Musculoskeletal: Normal range of motion. She exhibits tenderness. She exhibits no edema.  R paraspinal lumbar tenderness  Neurological: She is alert and oriented to person, place, and time. No cranial nerve deficit. She exhibits normal muscle tone. Coordination normal.  No ataxia on finger to nose  bilaterally. No pronator drift. 5/5 strength throughout. CN 2-12 intact. Negative Romberg. Equal grip strength. Sensation intact. Gait is normal.   Skin: Skin is warm.  Psychiatric: She has a normal mood and affect. Her behavior is normal.  Nursing note and vitals reviewed.   ED Course  Procedures (including critical care time) Labs Review Labs Reviewed  URINALYSIS, ROUTINE W REFLEX MICROSCOPIC - Abnormal; Notable for the following:    APPearance CLOUDY (*)    Ketones, ur 15 (*)    All other components within normal limits  CBC WITH DIFFERENTIAL - Abnormal; Notable for the following:    WBC 12.6 (*)    RBC 5.16 (*)    Hemoglobin 15.6 (*)    HCT 46.3 (*)    Neutrophils Relative % 82 (*)    Neutro Abs 10.3 (*)    Lymphocytes Relative 6 (*)    Monocytes Absolute 1.4 (*)    All other components within normal limits  PREGNANCY, URINE  COMPREHENSIVE METABOLIC PANEL  LIPASE, BLOOD     Imaging Review [place] Abdomen Limited Ruq  03/25/2014   CLINICAL DATA:  28 year old female presenting with 2 day history of right upper quadrant abdominal pain, with nausea, vomiting and diarrhea. Known history of gallstones.  EXAM: US ABDOMEN LIMITED - RIGHT UPPER QUADRANT  COMPARISON:  Abdominal ultrasound 08/14/2013.  FINDINGS: Gallbladder:  Multiple echogenic foci with posterior acoustic shadowing in the gallbladder, compatible with for cholelithiasis. Gallbladder is only mildly distended. Gallbladder wall thickness is normal (2.4 mm). No pericholecystic fluid. Per report from the sonographer, the patient did not exhibit a sonographic Murphy's sign on examination.  Common bile duct:  Diameter: 2.3 mm (normal).  Liver:  No focal lesion identified. Within normal limits in parenchymal echogenicity.  IMPRESSION: 1. Cholelithiasis without evidence to suggest acute cholecystitis at this time.   Electronically Signed   By: Trudie Reedaniel  Entrikin M.D.   On: 03/25/2014 18:11     EKG Interpretation None      MDM   Final diagnoses:  RUQ pain  Biliary colic   Right upper quadrant pain with nausea and vomiting and history of gallstones.  WBC 12.  LFTs normal. Lipase normal.  Ultrasound shows cholelithiasis without evidence of cholecystitis. Abdomen is soft without peritoneal signs.  Patient tolerating by mouth fluids. Will treat symptoms. Advised patient needs follow-up with surgery. [facility]. Return precautions discussed.  BP 107/66 mmHg  Pulse 87  Temp(Src) 99.1 F (37.3 C) (Oral)  Resp 18  Wt 210 lb (95.255 kg)  SpO2 100%  LMP 02/25/2014      Glynn OctaveStephen Osha Rane, MD 03/26/14 [phone]

## 2014-03-25 NOTE — Discharge Instructions (Signed)
Biliary Colic  As discussed, follow up with the surgeons. Return to the ED if you develop new or worsening symptoms. Biliary colic is a steady or irregular pain in the upper abdomen. It is usually under the right side of the rib cage. It happens when gallstones interfere with the normal flow of bile from the gallbladder. Bile is a liquid that helps to digest fats. Bile is made in the liver and stored in the gallbladder. When you eat a meal, bile passes from the gallbladder through the cystic duct and the common bile duct into the small intestine. There, it mixes with partially digested food. If a gallstone blocks either of these ducts, the normal flow of bile is blocked. The muscle cells in the bile duct contract forcefully to try to move the stone. This causes the pain of biliary colic.  SYMPTOMS   A person with biliary colic usually complains of pain in the upper abdomen. This pain can be:  In the center of the upper abdomen just below the breastbone.  In the upper-right part of the abdomen, near the gallbladder and liver.  Spread back toward the right shoulder blade.  Nausea and vomiting.  The pain usually occurs after eating.  Biliary colic is usually triggered by the digestive system's demand for bile. The demand for bile is high after fatty meals. Symptoms can also occur when a person who has been fasting suddenly eats a very large meal. Most episodes of biliary colic pass after 1 to 5 hours. After the most intense pain passes, your abdomen may continue to ache mildly for about 24 hours. DIAGNOSIS  After you describe your symptoms, your caregiver will perform a physical exam. He or she will pay attention to the upper right portion of your belly (abdomen). This is the area of your liver and gallbladder. An ultrasound will help your caregiver look for gallstones. Specialized scans of the gallbladder may also be done. Blood tests may be done, especially if you have fever or if your pain  persists. PREVENTION  Biliary colic can be prevented by controlling the risk factors for gallstones. Some of these risk factors, such as heredity, increasing age, and pregnancy are a normal part of life. Obesity and a high-fat diet are risk factors you can change through a healthy lifestyle. Women going through menopause who take hormone replacement therapy (estrogen) are also more likely to develop biliary colic. TREATMENT   Pain medication may be prescribed.  You may be encouraged to eat a fat-free diet.  If the first episode of biliary colic is severe, or episodes of colic keep retuning, surgery to remove the gallbladder (cholecystectomy) is usually recommended. This procedure can be done through small incisions using an instrument called a laparoscope. The procedure often requires a brief stay in the hospital. Some people can leave the hospital the same day. It is the most widely used treatment in people troubled by painful gallstones. It is effective and safe, with no complications in more than 90% of cases.  If surgery cannot be done, medication that dissolves gallstones may be used. This medication is expensive and can take months or years to work. Only small stones will dissolve.  Rarely, medication to dissolve gallstones is combined with a procedure called shock-wave lithotripsy. This procedure uses carefully aimed shock waves to break up gallstones. In many people treated with this procedure, gallstones form again within a few years. PROGNOSIS  If gallstones block your cystic duct or common bile duct, you are  at risk for repeated episodes of biliary colic. There is also a 25% chance that you will develop a gallbladder infection(acute cholecystitis), or some other complication of gallstones within 10 to 20 years. If you have surgery, schedule it at a time that is convenient for you and at a time when you are not sick. HOME CARE INSTRUCTIONS   Drink plenty of clear fluids.  Avoid fatty,  greasy or fried foods, or any foods that make your pain worse.  Take medications as directed. SEEK MEDICAL CARE IF:   You develop a fever over 100.5 F (38.1 C).  Your pain gets worse over time.  You develop nausea that prevents you from eating and drinking.  You develop vomiting. SEEK IMMEDIATE MEDICAL CARE IF:   You have continuous or severe belly (abdominal) pain which is not relieved with medications.  You develop nausea and vomiting which is not relieved with medications.  You have symptoms of biliary colic and you suddenly develop a fever and shaking chills. This may signal cholecystitis. Call your caregiver immediately.  You develop a yellow color to your skin or the white part of your eyes (jaundice). Document Released: 09/17/2005 Document Revised: 07/09/2011 Document Reviewed: 11/27/2007 [facility] Patient Information 2015 [employer] [place]. This information is not intended to replace advice given to you by your health care provider. Make sure you discuss any questions you have with your health care provider.

## 2014-03-25 NOTE — ED Notes (Signed)
Patient here with general abdominal pain with vomiting x 2 days, also general headache. Thinks the vomiting is related to her gallstones

## 2014-04-05 ENCOUNTER — Encounter (HOSPITAL_COMMUNITY): Payer: Self-pay | Admitting: *Deleted

## 2014-07-30 ENCOUNTER — Encounter (HOSPITAL_BASED_OUTPATIENT_CLINIC_OR_DEPARTMENT_OTHER): Payer: Self-pay

## 2014-07-30 ENCOUNTER — Emergency Department (HOSPITAL_BASED_OUTPATIENT_CLINIC_OR_DEPARTMENT_OTHER)
Admission: EM | Admit: 2014-07-30 | Discharge: 2014-07-30 | Disposition: A | Discharge status: Home / Self Care | Payer: Self-pay | Attending: Emergency Medicine | Admitting: Emergency Medicine

## 2014-07-30 DIAGNOSIS — G43909 Migraine, unspecified, not intractable, without status migrainosus: Secondary | ICD-10-CM | POA: Insufficient documentation

## 2014-07-30 DIAGNOSIS — K0889 Other specified disorders of teeth and supporting structures: Secondary | ICD-10-CM

## 2014-07-30 DIAGNOSIS — Z87442 Personal history of urinary calculi: Secondary | ICD-10-CM | POA: Insufficient documentation

## 2014-07-30 DIAGNOSIS — J45909 Unspecified asthma, uncomplicated: Secondary | ICD-10-CM | POA: Insufficient documentation

## 2014-07-30 DIAGNOSIS — K088 Other specified disorders of teeth and supporting structures: Secondary | ICD-10-CM | POA: Insufficient documentation

## 2014-07-30 DIAGNOSIS — Z87440 Personal history of urinary (tract) infections: Secondary | ICD-10-CM | POA: Insufficient documentation

## 2014-07-30 DIAGNOSIS — Z791 Long term (current) use of non-steroidal anti-inflammatories (NSAID): Secondary | ICD-10-CM | POA: Insufficient documentation

## 2014-07-30 DIAGNOSIS — I10 Essential (primary) hypertension: Secondary | ICD-10-CM | POA: Insufficient documentation

## 2014-07-30 DIAGNOSIS — Z720 Tobacco use: Secondary | ICD-10-CM | POA: Insufficient documentation

## 2014-07-30 DIAGNOSIS — F329 Major depressive disorder, single episode, unspecified: Secondary | ICD-10-CM | POA: Insufficient documentation

## 2014-07-30 DIAGNOSIS — Z880 Allergy status to penicillin: Secondary | ICD-10-CM | POA: Insufficient documentation

## 2014-07-30 DIAGNOSIS — F419 Anxiety disorder, unspecified: Secondary | ICD-10-CM | POA: Insufficient documentation

## 2014-07-30 DIAGNOSIS — Z79899 Other long term (current) drug therapy: Secondary | ICD-10-CM | POA: Insufficient documentation

## 2014-07-30 MED ORDER — IBUPROFEN 600 MG PO TABS: 600.0000 mg | ORAL_TABLET | Freq: Four times a day (QID) | ORAL | Status: DC | PRN

## 2014-07-30 MED ORDER — CLINDAMYCIN HCL 150 MG PO CAPS: 150.0000 mg | ORAL_CAPSULE | Freq: Four times a day (QID) | ORAL | Status: DC

## 2014-07-30 NOTE — ED Notes (Signed)
Reports mouth pain and infection due to "wisdom teeth coming in." Pt does not have dentist or oral surgeon due to lack of insurance.

## 2014-07-30 NOTE — ED Notes (Signed)
PA at bedside.

## 2014-07-30 NOTE — Discharge Instructions (Signed)
Dental Pain °A tooth ache may be caused by cavities (tooth decay). Cavities expose the nerve of the tooth to air and hot or cold temperatures. It may come from an infection or abscess (also called a boil or furuncle) around your tooth. It is also often caused by dental caries (tooth decay). This causes the pain you are having. °DIAGNOSIS  °Your caregiver can diagnose this problem by exam. °TREATMENT  °· If caused by an infection, it may be treated with medications which kill germs (antibiotics) and pain medications as prescribed by your caregiver. Take medications as directed. °· Only take over-the-counter or prescription medicines for pain, discomfort, or fever as directed by your caregiver. °· Whether the tooth ache today is caused by infection or dental disease, you should see your dentist as soon as possible for further care. °SEEK MEDICAL CARE IF: °The exam and treatment you received today has been provided on an emergency basis only. This is not a substitute for complete medical or dental care. If your problem worsens or new problems (symptoms) appear, and you are unable to meet with your dentist, call or return to this location. °SEEK IMMEDIATE MEDICAL CARE IF:  °· You have a fever. °· You develop redness and swelling of your face, jaw, or neck. °· You are unable to open your mouth. °· You have severe pain uncontrolled by pain medicine. °MAKE SURE YOU:  °· Understand these instructions. °· Will watch your condition. °· Will get help right away if you are not doing well or get worse. °Document Released: 04/16/2005 Document Revised: 07/09/2011 Document Reviewed: 12/03/2007 °ExitCare® Patient Information ©2015 [employer]. This information is not intended to replace advice given to you by your health care provider. Make sure you discuss any questions you have with your health care provider. ° °Emergency Department Resource Guide °1) Find a Doctor and Pay Out of Pocket °Although you won't have to find out who  is covered by your insurance plan, it is a good idea to ask around and get recommendations. You will then need to call the office and see if the doctor you have chosen will accept you as a new patient and what types of options they offer for patients who are self-pay. Some doctors offer discounts or will set up payment plans for their patients who do not have insurance, but you will need to ask so you aren't surprised when you get to your appointment. ° °2) Contact Your Local Health Department °Not all health departments have doctors that can see patients for sick visits, but many do, so it is worth a call to see if yours does. If you don't know where your local health department is, you can check in your phone book. The CDC also has a tool to help you locate your state's health department, and many state websites also have listings of all of their local health departments. ° °3) Find a Walk-in Clinic °If your illness is not likely to be very severe or complicated, you may want to try a walk in clinic. These are popping up all over the country in pharmacies, drugstores, and shopping centers. They're usually staffed by nurse practitioners or physician assistants that have been trained to treat common illnesses and complaints. They're usually fairly quick and inexpensive. However, if you have serious medical issues or chronic medical problems, these are probably not your best option. ° °No Primary Care Doctor: °- Call [facility] at  832-8000 - they can help you locate a primary   care doctor that  accepts your insurance, provides certain services, etc. °- Physician Referral Service- 1-800-533-3463 ° °Chronic Pain Problems: °Organization         Address  Phone   Notes  °[facility]  (336) 297-2271 Patients need to be referred by their primary care doctor.  ° °Medication Assistance: °Organization         Address  Phone   Notes  °[place] [facility] [address]  [address] °[place], [place] [address] (336) 641-8030 --Must be a resident of [place] °-- Must have NO insurance coverage whatsoever (no Medicaid/ Medicare, etc.) °-- The pt. MUST have a primary care doctor that directs their care regularly and follows them in the community °  °[facility]  (866) 331-1348   °[employer]  (888) 892-1162   ° °Agencies that provide inexpensive medical care: °Organization         Address  Phone   Notes  °[facility]  (336) 832-8035   °[facility]    (336) 832-7272   °[facility] [address] °[place], [place] [address] (336) 832-4777   °[facility] [address], °[place] (336) 271-4999   °[facility]    (336) 373-0678   °[facility]    (336) 272-1050   °[facility] ° [address], [place] Phone:  (336) 832-4444, Fax:  (336) 832-4440 Hours of Operation:  9 am - 6 pm, M-F.  Also accepts Medicaid/Medicare and self-pay.  °[facility] ° [address], [place] Phone: (336) 832-3150, Fax: (336) 832-3151. Hours of Operation:  8:30 am - 5:30 pm, M-F.  Also accepts Medicaid and self-pay.  °[facility] [place] [address], [place] Phone: (336) 878-6027   °[facility] [address], [place], [place] (336)723-1848, Ext. 123 Mondays & Thursdays: 7-9 AM.  First 15 patients are seen on a first come, first serve basis. °  ° °Medicaid-accepting [place] Providers: ° °Organization         Address  Phone   Notes  °[facility] [address], [place] (336) 641-2100 Also accepts self-pay patients.  °[facility] [address], [place] ° (336) 856-9996   °[facility] [address], [place] (336) 288-8857   °[facility] [address] [place] [address], [place] (336) 299-7000   °Veita Bland [address], [place]  ° (336) 373-1557 Only accepts [place] Access Medicaid patients after they have their name applied to their card.  ° °Self-Pay (no insurance) in [place]: ° °Organization         Address  Phone   Notes  °Sickle Cell Patients, [facility] [address], [place] (336) 832-1970   °[facility] [address], [place] (336) 832-4400   °[facility] ° [address], [place] (336) 992-4800   °[facility]/Dr. Osei-Bonsu ° [address] [place] [address], [place] or [address], [place] (336) 841-8500 Phone number for both [place] and [place] locations is the same.  °[facility] [address], [place] (336) 299-0000   °[facility] [address] [place] [address], [place] or [address] (336) 852-7530 °(336) 878-2260   °  [facility] [address], [place] (336) 350-1642, phone; (336) 294-5005, fax Sees patients 1st and 3rd Saturday of every month.  Must not qualify for public or private insurance (i.e. Medicaid, Medicare, [place] Health Choice, Veterans' Benefits) • Household income should be no more than 200% of the poverty level •The clinic cannot treat you if you are pregnant or think you are pregnant • Sexually transmitted diseases are not treated at the clinic.  ° ° °Dental Care: °Organization         Address  Phone  Notes  °[facility] [address], [place] (336) 641-6152 Accepts children up to age 21 who are enrolled in Medicaid or [place] Health Choice; pregnant women with a Medicaid card; and children who have applied for Medicaid or [place] Health Choice, but were declined, whose parents can pay a reduced fee at time of service.  °[facility] [place]  [address], [place] (336) 641-7733 Accepts children up to age 21 who are  enrolled in Medicaid or [place] Health Choice; pregnant women with a Medicaid card; and children who have applied for Medicaid or [place] Health Choice, but were declined, whose parents can pay a reduced fee at time of service.  °[facility] ° [address], [place] (336) 641-4533 Patients are seen by appointment only. Walk-ins are not accepted. [facility] will see patients 18 years of age and older. °Monday - Tuesday (8am-5pm) °Most Wednesdays (8:30-5pm) °$30 per visit, cash only  °[facility] ° [address], [place] (336) 641-4533 Patients are seen by appointment only. Walk-ins are not accepted. [facility] will see patients 18 years of age and older. °One Wednesday Evening (Monthly: Volunteer Based).  $30 per visit, cash only  °[facility]  (919) 537-3737 for adults; Children under age 4, call Graduate Pediatric Dentistry at ([phone]) [phone]. Children aged 4-14, please call (919) [phone] to request a pediatric application. ° Dental services are provided in all areas of dental care including fillings, crowns and bridges, complete and partial dentures, implants, gum treatment, root canals, and extractions. Preventive care is also provided. Treatment is provided to both adults and children. °Patients are selected via a lottery and there is often a waiting list. °  °[facility] [address], °[place] ° (336) 763-8833 [email or web address] °  °[facility] [address], [place], [place] (336)723-1848, Ext. 123 Second and Fourth Thursday of each month, opens at 6:30 AM; Clinic ends at 9 AM.  Patients are seen on a first-come first-served basis, and a limited number are seen during each clinic.  ° °[facility] ° [address], [place], [place] (336) 723-7904   Eligibility Requirements °You must have lived in [place], [place], or [place] counties for at least the last three months. °  You  cannot be eligible for state or federal sponsored healthcare insurance, including Veterans Administration, Medicaid, or Medicare. °  You generally cannot be eligible for healthcare insurance through your employer.  °  How to apply: °Eligibility screenings are held every Tuesday and Wednesday afternoon from 1:00 pm until 4:00 pm. You do not need an appointment for the interview!  °[facility] [address], [place],  336-631-2330   °[facility]  336-342-8273   °[facility]  336-703-3100   °[facility]  336-570-6415   ° °Behavioral Health Resources in the Community: °Intensive Outpatient Programs °Organization         Address  Phone  Notes  °[facility] [address], [place], [place] 336-878-6098   °[facility] [address], [place], [place] 336-832-9800   °[facility] [address], [place], [place] ° 336-882-2125   °[facility] [address],  °[place], [place] 1-800-853-5163 or 336-641-4981   °Substance Abuse Resources °Organization         Address  Phone  Notes  °[facility]  336-882-2125   °[facility]  336-784-9470   °[facility]  336-285-9073   °[facility]  336-845-3988   °Residential & Outpatient Substance Abuse Program  1-800-659-3381   °Psychological Services °Organization         Address  Phone  Notes  °[facility]  336- 832-9600   °[facility]  336- 378-7881   °[facility] [address], [place] 1-800-853-5163 or 336-641-4981   ° °Mobile Crisis Teams °Organization         Address  Phone  Notes  °[facility], Mobile Crisis Care Unit  1-877-626-1772   °Assertive °[facility] ° [address] [place], [place] 336-834-9664   °Sharon DeEsch [address] °[place] 336-554-5454   ° °Self-Help/Support  Groups °Organization         Address  Phone             Notes  °[facility] - variety of support groups  336- 373-1402 Call for more information  °Narcotics Anonymous (NA), [facility] [address], °[place]  2 meetings at this location  ° °Residential Treatment Programs °Organization         Address  Phone  Notes  °[facility] [address],    °[place]  1-866-801-8205   °[facility] ° [address], [place], [place] 704-293-8524   °[facility] [address], [place] 336-845-3988 Admissions: 8am-3pm M-F  °[facility] [address]    °[place], [place] 336-841-1104   °[facility] [address], [place], [place] 336-379-7146   °[facility] [address]  °[place], [place] 336-285-9073   °[facility] Intensive Outpatient [address], [place], [place] 336-852-3033   °[facility] [address]  °[place], [place] 1-877-615-2722 or 336-784-9470   °[facility] [address] [place], [place] 336-227-7417 Accepts Medicaid  °[facility] [address]  ° [place] 1-800-659-3381 Substance Abuse/Addiction Treatment  ° °[place] Behavioral Health Resources °Organization         Address  Phone  Notes  °[facility]  (888) 581-9988   °Julie Brannon, PhD [address] [place], [place]   (336) 349-5553 or (336) 951-0000   °[facility]   [address] °[place], [place] (336) 349-4454   °[facility] [address], [place],  (336) 342-8316 Insurance/Medicaid/sponsorship through [facility]  °[facility] [address]                                    [place],  (336) 342-8316 Therapy/tele-psych/case  °[facility]   [address]  ° [place], [place] (336) 349-2233    °Dr. Arfeen  (336) 349-4544   °[facility] [place]  [employer] [facility] [place] [facility] 1) [address], [place] °2) [address], [place] °3)  [address], [place] (336) 349-3220 °(336) 342-7768 ° °(336) 342-8140   °[place] [facility] (336) 342-1394 or (336) 342-3537 (After Hours)    ° ° ° °

## 2014-07-30 NOTE — ED Provider Notes (Signed)
CSN: [id number]     Arrival date & time 07/30/14  1201 History   First MD Initiated Contact with Patient 07/30/14 1205     Chief Complaint  Patient presents with  . Dental Pain     (Consider location/radiation/quality/duration/timing/severity/associated sxs/prior Treatment) Patient is a 29 y.o. female presenting with tooth pain. The history is provided by the patient. No language interpreter was used.  Dental Pain Location:  Upper and lower Upper teeth location:  1/RU 3rd molar and 16/LU 3rd molar Lower teeth location:  17/LL 3rd molar and 32/RL 3rd molar Quality:  Aching Severity:  Moderate Onset quality:  Gradual Timing:  Constant Progression:  Worsening Chronicity:  New Context: not dental caries   Relieved by:  Nothing Worsened by:  Nothing tried Ineffective treatments:  None tried Associated symptoms: no neck pain     Past Medical History  Diagnosis Date  . Bronchitis   . Anxiety   . Hypertension   . Urinary tract infection   . Irritable bowel   . Depression   . Asthma   . Increased frequency of headaches   . Fatty liver disease, nonalcoholic   . Migraines   . Kidney stones   . Gall stones    Past Surgical History  Procedure Laterality Date  . No past surgeries     Family History  Problem Relation Age of Onset  . Physical abuse Mother   . Anxiety disorder Mother   . ADD / ADHD Mother   . Paranoid behavior Mother   . Drug abuse Father   . Depression Father   . Alcohol abuse Maternal Grandfather   . ADD / ADHD Maternal Grandmother   . Anxiety disorder Maternal Grandmother   . Dementia Maternal Grandmother   . OCD Maternal Grandmother   . Sexual abuse Maternal Grandmother   . Alcohol abuse Paternal Grandfather   . Bipolar disorder Cousin   . Schizophrenia Neg Hx   . Seizures Neg Hx    History  Substance Use Topics  . Smoking status: Current Every Day Smoker -- 1.00 packs/day    Types: Cigarettes  . Smokeless tobacco: Never Used  . Alcohol Use:  No   OB History    No data available     Review of Systems  Musculoskeletal: Negative for neck pain.  All other systems reviewed and are negative.     Allergies  Sulfa antibiotics; Pseudoephedrine; Cephalosporins; Ciprocin-fluocin-procin; Ciprofloxacin hcl; Flagyl; Hydrochlorothiazide; and Penicillins  Home Medications   Prior to Admission medications   Medication Sig Start Date End Date Taking? Authorizing Provider  acetaminophen (TYLENOL) 325 MG tablet Take 650 mg by mouth every 6 (six) hours as needed for headache.    Historical Provider, MD  ALPRAZolam Prudy Feeler) 1 MG tablet Take 1 mg by mouth 3 (three) times daily as needed for anxiety. 10/29/13 10/29/14  Myrlene Broker, MD  buPROPion (WELLBUTRIN XL) 150 MG 24 hr tablet Take 1 tablet (150 mg total) by mouth every morning. 11/02/13 11/02/14  Beau Fanny, FNP  diphenhydrAMINE (BENADRYL) 25 MG tablet Take 1 tablet (25 mg total) by mouth every 6 (six) hours as needed (nausea/headache). 12/08/13   Loren Racer, MD  HYDROcodone-acetaminophen (NORCO/VICODIN) 5-325 MG per tablet Take 2 tablets by mouth every 4 (four) hours as needed for moderate pain or severe pain. 09/23/13   Rolland Porter, MD  HYDROcodone-acetaminophen (NORCO/VICODIN) 5-325 MG per tablet Take 2 tablets by mouth every 4 (four) hours as needed. 03/25/14   Glynn Octave, MD  ibuprofen (ADVIL,MOTRIN) 200 MG tablet Take 400 mg by mouth every 6 (six) hours as needed for headache.    Historical Provider, MD  ibuprofen (ADVIL,MOTRIN) 600 MG tablet Take 1 tablet (600 mg total) by mouth every 6 (six) hours as needed. 12/08/13   Loren Raceravid Yelverton, MD  metoCLOPramide (REGLAN) 10 MG tablet Take 1 tablet (10 mg total) by mouth every 8 (eight) hours as needed for nausea. 12/08/13   Loren Raceravid Yelverton, MD  metroNIDAZOLE (METROGEL VAGINAL) 0.75 % vaginal gel Place 1 Applicatorful vaginally at bedtime. X 5 days 01/19/14   Mercedes Camprubi-Soms, PA-C  naproxen (NAPROSYN) 500 MG tablet Take 1 tablet (500  mg total) by mouth 2 (two) times daily as needed for mild pain, moderate pain or headache (TAKE WITH MEALS.). 01/19/14   Mercedes Camprubi-Soms, PA-C  nitrofurantoin, macrocrystal-monohydrate, (MACROBID) 100 MG capsule Take 1 capsule (100 mg total) by mouth 2 (two) times daily. 11/27/13   Jerelyn ScottMartha Linker, MD  nitrofurantoin, macrocrystal-monohydrate, (MACROBID) 100 MG capsule Take 1 capsule (100 mg total) by mouth 2 (two) times daily. X 7 days 01/19/14   Mercedes Camprubi-Soms, PA-C  ondansetron (ZOFRAN ODT) 4 MG disintegrating tablet Take 1 tablet (4 mg total) by mouth every 8 (eight) hours as needed for nausea or vomiting. 11/27/13   Jerelyn ScottMartha Linker, MD  ondansetron (ZOFRAN ODT) 4 MG disintegrating tablet Take 1 tablet (4 mg total) by mouth every 8 (eight) hours as needed for nausea or vomiting. 01/19/14   Mercedes Camprubi-Soms, PA-C  ondansetron (ZOFRAN-ODT) 4 MG disintegrating tablet Take 4 mg by mouth every 8 (eight) hours as needed for nausea. 09/23/13   Rolland PorterMark James, MD  promethazine (PHENERGAN) 25 MG tablet Take 1 tablet (25 mg total) by mouth every 6 (six) hours as needed for nausea or vomiting. 03/25/14   Glynn OctaveStephen Rancour, MD   BP 118/94 mmHg  Pulse 101  Temp(Src) 98.1 F (36.7 C) (Oral)  Resp 18  Ht 5\' 6"  (1.676 m)  Wt 193 lb (87.544 kg)  BMI 31.17 kg/m2  SpO2 100%  LMP 07/17/2014 Physical Exam  Constitutional: She is oriented to person, place, and time. She appears well-developed and well-nourished.  HENT:  Head: Normocephalic.  Slightly impacted wisdom teeth  Eyes: EOM are normal.  Neck: Normal range of motion.  Pulmonary/Chest: Effort normal.  Abdominal: She exhibits no distension.  Musculoskeletal: Normal range of motion.  Neurological: She is alert and oriented to person, place, and time.  Psychiatric: She has a normal mood and affect.  Nursing note and vitals reviewed.   ED Course  Procedures (including critical care time) Labs Review Labs Reviewed - No data to  display  Imaging Review No results found.   EKG Interpretation None      MDM   Final diagnoses:  Tooth ache    Clindamycin Ibuprofen Resource guide   Elson AreasLeslie K Matthias Bogus, PA-C 07/30/14 1240  Mirian MoMatthew Gentry, MD 07/31/14 [phone]

## 2015-06-16 ENCOUNTER — Emergency Department (HOSPITAL_COMMUNITY)
Admission: EM | Admit: 2015-06-16 | Discharge: 2015-06-16 | Disposition: A | Discharge status: Home / Self Care | Payer: Self-pay | Attending: Emergency Medicine | Admitting: Emergency Medicine

## 2015-06-16 ENCOUNTER — Encounter (HOSPITAL_COMMUNITY): Payer: Self-pay | Admitting: Emergency Medicine

## 2015-06-16 DIAGNOSIS — F17210 Nicotine dependence, cigarettes, uncomplicated: Secondary | ICD-10-CM | POA: Insufficient documentation

## 2015-06-16 DIAGNOSIS — M25511 Pain in right shoulder: Secondary | ICD-10-CM | POA: Insufficient documentation

## 2015-06-16 DIAGNOSIS — J45909 Unspecified asthma, uncomplicated: Secondary | ICD-10-CM | POA: Insufficient documentation

## 2015-06-16 DIAGNOSIS — I10 Essential (primary) hypertension: Secondary | ICD-10-CM | POA: Insufficient documentation

## 2015-06-16 DIAGNOSIS — R1011 Right upper quadrant pain: Secondary | ICD-10-CM | POA: Insufficient documentation

## 2015-06-16 LAB — COMPREHENSIVE METABOLIC PANEL
ALT: 30 U/L (ref 14–54)
AST: 21 U/L (ref 15–41)
Albumin: 4.2 g/dL (ref 3.5–5.0)
Alkaline Phosphatase: 50 U/L (ref 38–126)
Anion gap: 7 (ref 5–15)
BUN: 12 mg/dL (ref 6–20)
CHLORIDE: 105 mmol/L (ref 101–111)
CO2: 27 mmol/L (ref 22–32)
Calcium: 8.8 mg/dL — ABNORMAL LOW (ref 8.9–10.3)
Creatinine, Ser: 0.72 mg/dL (ref 0.44–1.00)
Glucose, Bld: 113 mg/dL — ABNORMAL HIGH (ref 65–99)
Potassium: 3.9 mmol/L (ref 3.5–5.1)
Sodium: 139 mmol/L (ref 135–145)
Total Bilirubin: 0.5 mg/dL (ref 0.3–1.2)
Total Protein: 7.2 g/dL (ref 6.5–8.1)

## 2015-06-16 LAB — CBC
HCT: 43.2 % (ref 36.0–46.0)
Hemoglobin: 14.1 g/dL (ref 12.0–15.0)
MCH: 30 pg (ref 26.0–34.0)
MCHC: 32.6 g/dL (ref 30.0–36.0)
MCV: 91.9 fL (ref 78.0–100.0)
Platelets: 320 10*3/uL (ref 150–400)
RBC: 4.7 MIL/uL (ref 3.87–5.11)
RDW: 12.3 % (ref 11.5–15.5)
WBC: 8.2 10*3/uL (ref 4.0–10.5)

## 2015-06-16 LAB — LIPASE, BLOOD: LIPASE: 30 U/L (ref 11–51)

## 2015-06-16 NOTE — ED Notes (Signed)
Having abdominal pain to right upper quadrant and right shoulder pain.  Rate [place] 8/10 for both. Denies any injury.

## 2015-06-16 NOTE — ED Notes (Signed)
Called patient to place in room. No answer. 

## 2015-06-16 NOTE — ED Notes (Signed)
Called patient to place in room. Still no answer.

## 2015-06-21 ENCOUNTER — Emergency Department
Admission: EM | Admit: 2015-06-21 | Discharge: 2015-06-21 | Disposition: A | Discharge status: Home / Self Care | Payer: Self-pay | Attending: Emergency Medicine | Admitting: Emergency Medicine

## 2015-06-21 ENCOUNTER — Encounter: Payer: Self-pay | Admitting: Emergency Medicine

## 2015-06-21 DIAGNOSIS — I10 Essential (primary) hypertension: Secondary | ICD-10-CM | POA: Insufficient documentation

## 2015-06-21 DIAGNOSIS — R102 Pelvic and perineal pain: Secondary | ICD-10-CM | POA: Insufficient documentation

## 2015-06-21 DIAGNOSIS — R61 Generalized hyperhidrosis: Secondary | ICD-10-CM | POA: Insufficient documentation

## 2015-06-21 LAB — URINALYSIS COMPLETE WITH MICROSCOPIC (ARMC ONLY)
BACTERIA UA: NONE SEEN
Bilirubin Urine: NEGATIVE
Glucose, UA: NEGATIVE mg/dL
Ketones, ur: NEGATIVE mg/dL
NITRITE: NEGATIVE
PROTEIN: NEGATIVE mg/dL
SPECIFIC GRAVITY, URINE: 1.025 (ref 1.005–1.030)
pH: 5 (ref 5.0–8.0)

## 2015-06-21 NOTE — ED Notes (Signed)
Pt was called to be roomed into treatment area, no answer.  

## 2015-06-21 NOTE — ED Notes (Signed)
Pt was called to be roomed into treatment area, no answer.

## 2015-06-21 NOTE — ED Notes (Signed)
Pt to ed with c/o left side pelvic pain that radiates into left lower quad and into left flank area acute onset this am. Reports n/v x 2.  Also noted diaphoresis.

## 2015-06-21 NOTE — ED Notes (Signed)
Pt called to be roomed in treatment area without answer.

## 2015-06-25 ENCOUNTER — Emergency Department (HOSPITAL_BASED_OUTPATIENT_CLINIC_OR_DEPARTMENT_OTHER): Payer: Self-pay

## 2015-06-25 ENCOUNTER — Encounter (HOSPITAL_BASED_OUTPATIENT_CLINIC_OR_DEPARTMENT_OTHER): Payer: Self-pay

## 2015-06-25 ENCOUNTER — Emergency Department (HOSPITAL_BASED_OUTPATIENT_CLINIC_OR_DEPARTMENT_OTHER)
Admission: EM | Admit: 2015-06-25 | Discharge: 2015-06-25 | Disposition: A | Discharge status: Home / Self Care | Payer: Self-pay | Attending: Emergency Medicine | Admitting: Emergency Medicine

## 2015-06-25 DIAGNOSIS — J45909 Unspecified asthma, uncomplicated: Secondary | ICD-10-CM | POA: Insufficient documentation

## 2015-06-25 DIAGNOSIS — R110 Nausea: Secondary | ICD-10-CM | POA: Insufficient documentation

## 2015-06-25 DIAGNOSIS — Z3202 Encounter for pregnancy test, result negative: Secondary | ICD-10-CM | POA: Insufficient documentation

## 2015-06-25 DIAGNOSIS — I10 Essential (primary) hypertension: Secondary | ICD-10-CM | POA: Insufficient documentation

## 2015-06-25 DIAGNOSIS — Z87891 Personal history of nicotine dependence: Secondary | ICD-10-CM | POA: Insufficient documentation

## 2015-06-25 DIAGNOSIS — Z87440 Personal history of urinary (tract) infections: Secondary | ICD-10-CM | POA: Insufficient documentation

## 2015-06-25 DIAGNOSIS — R102 Pelvic and perineal pain: Secondary | ICD-10-CM

## 2015-06-25 DIAGNOSIS — Z87442 Personal history of urinary calculi: Secondary | ICD-10-CM | POA: Insufficient documentation

## 2015-06-25 DIAGNOSIS — Z8659 Personal history of other mental and behavioral disorders: Secondary | ICD-10-CM | POA: Insufficient documentation

## 2015-06-25 DIAGNOSIS — R1032 Left lower quadrant pain: Secondary | ICD-10-CM | POA: Insufficient documentation

## 2015-06-25 DIAGNOSIS — N898 Other specified noninflammatory disorders of vagina: Secondary | ICD-10-CM | POA: Insufficient documentation

## 2015-06-25 DIAGNOSIS — Z8719 Personal history of other diseases of the digestive system: Secondary | ICD-10-CM | POA: Insufficient documentation

## 2015-06-25 DIAGNOSIS — R350 Frequency of micturition: Secondary | ICD-10-CM | POA: Insufficient documentation

## 2015-06-25 LAB — URINALYSIS, ROUTINE W REFLEX MICROSCOPIC
BILIRUBIN URINE: NEGATIVE
Glucose, UA: NEGATIVE mg/dL
Hgb urine dipstick: NEGATIVE
KETONES UR: NEGATIVE mg/dL
NITRITE: NEGATIVE
PROTEIN: NEGATIVE mg/dL
Specific Gravity, Urine: 1.028 (ref 1.005–1.030)
pH: 6 (ref 5.0–8.0)

## 2015-06-25 LAB — WET PREP, GENITAL
Sperm: NONE SEEN
TRICH WET PREP: NONE SEEN
YEAST WET PREP: NONE SEEN

## 2015-06-25 LAB — COMPREHENSIVE METABOLIC PANEL
ALK PHOS: 43 U/L (ref 38–126)
ALT: 19 U/L (ref 14–54)
ANION GAP: 7 (ref 5–15)
AST: 16 U/L (ref 15–41)
Albumin: 4.1 g/dL (ref 3.5–5.0)
BUN: 17 mg/dL (ref 6–20)
CALCIUM: 8.8 mg/dL — AB (ref 8.9–10.3)
CO2: 24 mmol/L (ref 22–32)
CREATININE: 0.85 mg/dL (ref 0.44–1.00)
Chloride: 106 mmol/L (ref 101–111)
Glucose, Bld: 101 mg/dL — ABNORMAL HIGH (ref 65–99)
Potassium: 4.1 mmol/L (ref 3.5–5.1)
Sodium: 137 mmol/L (ref 135–145)
TOTAL PROTEIN: 7 g/dL (ref 6.5–8.1)
Total Bilirubin: 0.5 mg/dL (ref 0.3–1.2)

## 2015-06-25 LAB — CBC WITH DIFFERENTIAL/PLATELET
Basophils Absolute: 0 10*3/uL (ref 0.0–0.1)
Basophils Relative: 0 %
EOS ABS: 0.1 10*3/uL (ref 0.0–0.7)
EOS PCT: 2 %
HCT: 43.4 % (ref 36.0–46.0)
HEMOGLOBIN: 14.9 g/dL (ref 12.0–15.0)
LYMPHS PCT: 20 %
Lymphs Abs: 1.8 10*3/uL (ref 0.7–4.0)
MCH: 31.2 pg (ref 26.0–34.0)
MCHC: 34.3 g/dL (ref 30.0–36.0)
MCV: 90.8 fL (ref 78.0–100.0)
MONOS PCT: 11 %
Monocytes Absolute: 1 10*3/uL (ref 0.1–1.0)
NEUTROS PCT: 67 %
Neutro Abs: 6.2 10*3/uL (ref 1.7–7.7)
Platelets: 281 10*3/uL (ref 150–400)
RBC: 4.78 MIL/uL (ref 3.87–5.11)
RDW: 12.4 % (ref 11.5–15.5)
WBC: 9.1 10*3/uL (ref 4.0–10.5)

## 2015-06-25 LAB — URINE MICROSCOPIC-ADD ON

## 2015-06-25 LAB — PREGNANCY, URINE: Preg Test, Ur: NEGATIVE

## 2015-06-25 LAB — LIPASE, BLOOD: LIPASE: 27 U/L (ref 11–51)

## 2015-06-25 MED ORDER — LIDOCAINE HCL (PF) 1 % IJ SOLN
INTRAMUSCULAR | Status: AC
  Administered 2015-06-25: 0.9 mL
  Filled 2015-06-25: qty 5

## 2015-06-25 MED ORDER — HYDROCODONE-ACETAMINOPHEN 5-325 MG PO TABS: 1.0000 | ORAL_TABLET | ORAL | Status: DC | PRN

## 2015-06-25 MED ORDER — HYDROMORPHONE HCL 1 MG/ML IJ SOLN
0.5000 mg | Freq: Once | INTRAMUSCULAR | Status: AC
  Administered 2015-06-25: 0.5 mg via INTRAVENOUS
  Filled 2015-06-25: qty 1

## 2015-06-25 MED ORDER — CLINDAMYCIN HCL 150 MG PO CAPS: 300.0000 mg | ORAL_CAPSULE | Freq: Two times a day (BID) | ORAL | Status: DC

## 2015-06-25 MED ORDER — DOXYCYCLINE HYCLATE 100 MG PO CAPS: 100.0000 mg | ORAL_CAPSULE | Freq: Two times a day (BID) | ORAL | Status: DC

## 2015-06-25 MED ORDER — SODIUM CHLORIDE 0.9 % IV BOLUS (SEPSIS)
1000.0000 mL | Freq: Once | INTRAVENOUS | Status: AC
  Administered 2015-06-25: 1000 mL via INTRAVENOUS

## 2015-06-25 MED ORDER — CEFTRIAXONE SODIUM 250 MG IJ SOLR
250.0000 mg | Freq: Once | INTRAMUSCULAR | Status: AC
  Administered 2015-06-25: 250 mg via INTRAMUSCULAR
  Filled 2015-06-25: qty 250

## 2015-06-25 NOTE — ED Provider Notes (Signed)
CSN: [id number]     Arrival date & time 06/25/15  1005 History   First MD Initiated Contact with Patient 06/25/15 1011     Chief Complaint  Patient presents with  . Pelvic Pain    HPI   Carol [name] is a 30 y.o. female with a PMH of HTN, depression, anxiety who presents to the ED with pelvic pain x 3 days. She notes her pain has worsened, and now complains of left sided pain in her abdomen and back. She denies exacerbating or alleviating factors, and states she has tried OTC pain medication and warm showers for symptom relief, which have not been effective. She also notes urinary frequency and white malodorous vaginal discharge. She states she felt sweaty and nauseous yesterday. She denies fever, chills, vomiting, diarrhea, constipation, dysuria, vaginal bleeding.  Past Medical History  Diagnosis Date  . Bronchitis   . Anxiety   . Hypertension   . Urinary tract infection   . Irritable bowel   . Depression   . Asthma   . Increased frequency of headaches   . Fatty liver disease, nonalcoholic   . Migraines   . Kidney stones   . Gall stones    Past Surgical History  Procedure Laterality Date  . No past surgeries     Family History  Problem Relation Age of Onset  . Physical abuse Mother   . Anxiety disorder Mother   . ADD / ADHD Mother   . Paranoid behavior Mother   . Drug abuse Father   . Depression Father   . Alcohol abuse Maternal Grandfather   . ADD / ADHD Maternal Grandmother   . Anxiety disorder Maternal Grandmother   . Dementia Maternal Grandmother   . OCD Maternal Grandmother   . Sexual abuse Maternal Grandmother   . Alcohol abuse Paternal Grandfather   . Bipolar disorder Cousin   . Schizophrenia Neg Hx   . Seizures Neg Hx    Social History  Substance Use Topics  . Smoking status: Former Smoker -- 1.00 packs/day    Types: Cigarettes  . Smokeless tobacco: Never Used  . Alcohol Use: No   OB History    Gravida Para Term Preterm AB TAB SAB Ectopic  Multiple Living        Review of Systems  Constitutional: Negative for fever and chills.  Gastrointestinal: Positive for nausea and abdominal pain. Negative for vomiting, diarrhea and constipation.  Genitourinary: Positive for frequency, flank pain, vaginal discharge, vaginal pain and pelvic pain. Negative for dysuria, urgency and vaginal bleeding.  All other systems reviewed and are negative.     Allergies  Sulfa antibiotics; Pseudoephedrine; Cephalosporins; Ciprocin-fluocin-procin; Ciprofloxacin hcl; Flagyl; Hydrochlorothiazide; and Penicillins  Home Medications   Prior to Admission medications   Medication Sig Start Date End Date Taking? Authorizing Provider  clindamycin (CLEOCIN) 150 MG capsule Take 2 capsules (300 mg total) by mouth 2 (two) times daily. May dispense as  capsules 06/25/15   Mady Gemma, PA-C  doxycycline (VIBRAMYCIN) 100 MG capsule Take 1 capsule (100 mg total) by mouth 2 (two) times daily. 06/25/15   Mady Gemma, PA-C  HYDROcodone-acetaminophen (NORCO/VICODIN) 5-325 MG tablet Take 1 tablet by mouth every 4 (four) hours as needed. 06/25/15   Mady Gemma, PA-C    BP 120/74 mmHg  Pulse 74  Temp(Src) 98.4 F (36.9 C) (Oral)  Resp 14  Wt 97.07 kg  SpO2 100%  LMP  05/27/2015 Physical Exam  Constitutional: She is oriented to person, place, and time. She appears well-developed and well-nourished. No distress.  HENT:  Head: Normocephalic and atraumatic.  Right Ear: External ear normal.  Left Ear: External ear normal.  Nose: Nose normal.  Mouth/Throat: Uvula is midline, oropharynx is clear and moist and mucous membranes are normal.  Eyes: Conjunctivae, EOM and lids are normal. Pupils are equal, round, and reactive to light. Right eye exhibits no discharge. Left eye exhibits no discharge. No scleral icterus.  Neck: Normal range of motion. Neck supple.  Cardiovascular: Normal rate, regular rhythm, normal heart  sounds, intact distal pulses and normal pulses.   Pulmonary/Chest: Effort normal and breath sounds normal. No respiratory distress. She has no wheezes. She has no rales.  Abdominal: Soft. Normal appearance and bowel sounds are normal. She exhibits no distension and no mass. There is tenderness. There is no rigidity, no rebound and no guarding.  TTP in LLQ. Left sided CVA tenderness.  Genitourinary: Uterus is not enlarged and not tender. Cervix exhibits discharge. Cervix exhibits no motion tenderness and no friability. Right adnexum displays no mass, no tenderness and no fullness. Left adnexum displays tenderness. Left adnexum displays no mass and no fullness. There is tenderness in the vagina. No erythema or bleeding in the vagina. No foreign body around the vagina. Vaginal discharge found.  Moderate amount of white discharge present in vaginal vault. Diffuse TTP on bimanual exam, worse in left adnexal region.  Musculoskeletal: Normal range of motion. She exhibits no edema or tenderness.  Neurological: She is alert and oriented to person, place, and time. She has normal strength. No sensory deficit.  Skin: Skin is warm, dry and intact. No rash noted. She is not diaphoretic. No erythema. No pallor.  Psychiatric: She has a normal mood and affect. Her speech is normal and behavior is normal.  Nursing note and vitals reviewed.   ED Course  Procedures (including critical care time)  Labs Review Labs Reviewed  WET PREP, GENITAL - Abnormal; Notable for the following:    Clue Cells Wet Prep HPF POC PRESENT (*)    WBC, Wet Prep HPF POC MANY (*)    All other components within normal limits  URINALYSIS, ROUTINE W REFLEX MICROSCOPIC (NOT AT [facility]) - Abnormal; Notable for the following:    APPearance CLOUDY (*)    Leukocytes, UA SMALL (*)    All other components within normal limits  COMPREHENSIVE METABOLIC PANEL - Abnormal; Notable for the following:    Glucose, Bld 101 (*)    Calcium 8.8 (*)    All  other components within normal limits  URINE MICROSCOPIC-ADD ON - Abnormal; Notable for the following:    Squamous Epithelial / LPF 0-5 (*)    Bacteria, UA MANY (*)    All other components within normal limits  URINE CULTURE  PREGNANCY, URINE  CBC WITH DIFFERENTIAL/PLATELET  LIPASE, BLOOD  RPR  HIV ANTIBODY (ROUTINE TESTING)  GC/CHLAMYDIA PROBE AMP ([place]) NOT AT [facility]    Imaging Review Ct Abdomen Pelvis Wo Contrast  06/25/2015  CLINICAL DATA:  LEFT LOWER QUADRANT PAIN FOR 4 DAYS. HISTORY OF RENAL STONES. NO HEMATURIA. EXAM: CT ABDOMEN AND PELVIS WITHOUT CONTRAST TECHNIQUE: Multidetector CT imaging of the abdomen and pelvis was performed following the standard protocol without IV contrast. COMPARISON:  May 08, 2011 FINDINGS: Normal lung bases. No free air or free fluid. No right renal stones, hydronephrosis, or perinephric stranding. No right ureterectasis or ureteral stones. There is mild  hydronephrosis on the left. However, this is similar in appearance to the January 2013 comparison. Two tiny punctate stones are seen adjacent to each other on coronal image 61. No other renal stones. No perinephric stranding on the left. The left ureter remains mildly prominent compared to the right. However, this finding is also similar since the comparison study and there is no periureteral stranding. It is difficult to follow the ureter through the pelvis. However, I believe the calcifications in the pelvis are all likely phleboliths. There are some calcifications which are new since 2013. Specifically, there is a calcification near the base of the bladder on the left which is new since 2013 seen on series 2, image 77. However, I believe the ureter courses posterior to this calcification and this calcification is thought to be adjacent to rather than within the ureter. The liver, spleen, adrenal glands, and pancreas are normal. Stones are seen in the gallbladder with no adjacent fluid. The stomach and  small bowel are normal. No aneurysm or adenopathy. Scattered colonic diverticuli are seen without diverticulitis. The appendix is seen and normal. The pelvis demonstrates no adenopathy or mass. The uterus and adnexal are normal. The bladder is unremarkable as well. Visualized bones are normal. IMPRESSION: 1. The mild hydronephrosis on the left is similar in the interval and not thought to be acute. The lack of hematuria, perinephric stranding, and periureteral stranding support chronicity. There are calcifications in the pelvis. However, these are favored to be phleboliths adjacent to rather than within the ureters as described above. If there is continued concern for a ureteral stone, a CT scan with contrast and delayed imaging could better evaluate the location of the ureter versus these calcifications. Electronically Signed   By: Gerome Sam III M.D   On: 06/25/2015 13:48   US Transvaginal Non-ob  06/25/2015  CLINICAL DATA:  30 year old female with left pelvic pain for 4 days. EXAM: TRANSABDOMINAL AND TRANSVAGINAL ULTRASOUND OF PELVIS TECHNIQUE: Both transabdominal and transvaginal ultrasound examinations of the pelvis were performed. Transabdominal technique was performed for global imaging of the pelvis including uterus, ovaries, adnexal regions, and pelvic cul-de-sac. It was necessary to proceed with endovaginal exam following the transabdominal exam to visualize the ovaries and endometrium. COMPARISON:  None FINDINGS: Uterus Measurements: 8.3 x 3.8 x 4.1 cm. No fibroids or other mass visualized. Endometrium Thickness: 11 mm.  No focal abnormality visualized. Right ovary Measurements: 2.7 x 2.2 x 1.6 cm. Normal appearance/no adnexal mass. Left ovary Measurements: 4.3 x 2.4 x 2.4 cm. Normal appearance/no adnexal mass. Other findings A 5 mm calcification in the left adnexal region is noted and appears to be within a dilated left ureter. No free fluid or adnexal mass. IMPRESSION: 5 mm possible distal left  ureteral calculus. Consider CT as clinically indicated. Unremarkable pelvic ultrasound otherwise. Electronically Signed   By: Harmon Pier M.D.   On: 06/25/2015 12:40   US Pelvis Complete  06/25/2015  CLINICAL DATA:  30 year old female with left pelvic pain for 4 days. EXAM: TRANSABDOMINAL AND TRANSVAGINAL ULTRASOUND OF PELVIS TECHNIQUE: Both transabdominal and transvaginal ultrasound examinations of the pelvis were performed. Transabdominal technique was performed for global imaging of the pelvis including uterus, ovaries, adnexal regions, and pelvic cul-de-sac. It was necessary to proceed with endovaginal exam following the transabdominal exam to visualize the ovaries and endometrium. COMPARISON:  None FINDINGS: Uterus Measurements: 8.3 x 3.8 x 4.1 cm. No fibroids or other mass visualized. Endometrium Thickness: 11 mm.  No focal abnormality visualized. Right ovary  Measurements: 2.7 x 2.2 x 1.6 cm. Normal appearance/no adnexal mass. Left ovary Measurements: 4.3 x 2.4 x 2.4 cm. Normal appearance/no adnexal mass. Other findings A 5 mm calcification in the left adnexal region is noted and appears to be within a dilated left ureter. No free fluid or adnexal mass. IMPRESSION: 5 mm possible distal left ureteral calculus. Consider CT as clinically indicated. Unremarkable pelvic ultrasound otherwise. Electronically Signed   By: Harmon Pier M.D.   On: 06/25/2015 12:40   I have personally reviewed and evaluated these images and lab results as part of my medical decision-making.   EKG Interpretation None      MDM   Final diagnoses:  Left adnexal tenderness  LLQ abdominal pain    30 year old female presents with pelvic pain, abdominal pain, nausea, urinary frequency, and vaginal discharge x 3 days.   Patient is afebrile. Vital signs stable. Abdomen soft, nondistended, with tenderness to palpation left lower quadrant. Left-sided CVA tenderness present. On pelvic exam, patient has a moderate amount of white  discharge present in vaginal vault. No CMT or cervical friability. Diffuse TTP on bimanual exam, worse in left adnexal region.   Patient given pain medication and fluids. Labs pending. Will obtain ultrasound given tenderness on exam. On reassessment of patient, she reports her pain is resolved.  CBC negative for leukocytosis or anemia. CMP unremarkable. Lipase within normal limits. UA small leukocytes, 0-5 WBC, many bacteria. Urine culture ordered. Urine pregnancy negative. Wet prep remarkable for clue cells, many WBCs. Pelvic ultrasound remarkable for 5 mm possible distal left ureteral calculus, otherwise unremarkable. CT remarkable for mild hydronephrosis, thought to be chronic, calcifications in the pelvis, favored to be phleboliths. Discussed findings with patient.   Given pelvic and abdominal pain, vaginal discharge, increased WBCs on wet prep, will treat for PID. Patient given rocephin in the ED. Will discharge with doxycycline and clindamycin (patient states she has a flagyl allergy). Patient to follow up with PCP. Return precautions discussed. Patient verbalizes her understanding and is in agreement with plan.  BP 120/74 mmHg  Pulse 74  Temp(Src) 98.4 F (36.9 C) (Oral)  Resp 14  Wt 97.07 kg  SpO2 100%  LMP 05/27/2015    Mady Gemma, PA-C 06/25/15 1644  Rolan Bucco, MD 06/26/15 [phone]

## 2015-06-25 NOTE — ED Notes (Signed)
EDP at bedside  

## 2015-06-25 NOTE — ED Notes (Signed)
Patient here with pelvic pain x 3 days, reports nausea with urinary frequency and back pain. Reports some discharge with same

## 2015-06-25 NOTE — Discharge Instructions (Signed)
1. Medications: clindamycin, doxycycline, usual home medications 2. Treatment: rest, drink plenty of fluids 3. Follow Up: please followup with your primary doctor for discussion of your diagnoses and further evaluation after today's visit; please return to the ER for high fever, increased pain, new or worsening symptoms

## 2015-06-25 NOTE — ED Notes (Signed)
Pt in US at this time 

## 2015-06-25 NOTE — ED Notes (Signed)
Pt requesting more pain medication after using restroom. PA made aware.

## 2015-06-25 NOTE — ED Notes (Signed)
EDP at bedside discussing results with patient and family at this time.

## 2015-06-26 LAB — HIV ANTIBODY (ROUTINE TESTING W REFLEX): HIV Screen 4th Generation wRfx: NONREACTIVE

## 2015-06-26 LAB — URINE CULTURE: Culture: NO GROWTH

## 2015-06-26 LAB — RPR: RPR: NONREACTIVE

## 2015-06-27 ENCOUNTER — Encounter (HOSPITAL_BASED_OUTPATIENT_CLINIC_OR_DEPARTMENT_OTHER): Payer: Self-pay | Admitting: *Deleted

## 2015-06-27 ENCOUNTER — Emergency Department (HOSPITAL_BASED_OUTPATIENT_CLINIC_OR_DEPARTMENT_OTHER)
Admission: EM | Admit: 2015-06-27 | Discharge: 2015-06-27 | Disposition: A | Discharge status: Home / Self Care | Payer: Self-pay | Attending: Emergency Medicine | Admitting: Emergency Medicine

## 2015-06-27 DIAGNOSIS — Z8679 Personal history of other diseases of the circulatory system: Secondary | ICD-10-CM | POA: Insufficient documentation

## 2015-06-27 DIAGNOSIS — Z87440 Personal history of urinary (tract) infections: Secondary | ICD-10-CM | POA: Insufficient documentation

## 2015-06-27 DIAGNOSIS — Z880 Allergy status to penicillin: Secondary | ICD-10-CM | POA: Insufficient documentation

## 2015-06-27 DIAGNOSIS — R1032 Left lower quadrant pain: Secondary | ICD-10-CM | POA: Insufficient documentation

## 2015-06-27 DIAGNOSIS — Z8659 Personal history of other mental and behavioral disorders: Secondary | ICD-10-CM | POA: Insufficient documentation

## 2015-06-27 DIAGNOSIS — Z87891 Personal history of nicotine dependence: Secondary | ICD-10-CM | POA: Insufficient documentation

## 2015-06-27 DIAGNOSIS — Z792 Long term (current) use of antibiotics: Secondary | ICD-10-CM | POA: Insufficient documentation

## 2015-06-27 DIAGNOSIS — Z87442 Personal history of urinary calculi: Secondary | ICD-10-CM | POA: Insufficient documentation

## 2015-06-27 DIAGNOSIS — J45909 Unspecified asthma, uncomplicated: Secondary | ICD-10-CM | POA: Insufficient documentation

## 2015-06-27 DIAGNOSIS — Z8719 Personal history of other diseases of the digestive system: Secondary | ICD-10-CM | POA: Insufficient documentation

## 2015-06-27 LAB — COMPREHENSIVE METABOLIC PANEL
ALBUMIN: 4.3 g/dL (ref 3.5–5.0)
ALT: 17 U/L (ref 14–54)
ANION GAP: 8 (ref 5–15)
AST: 19 U/L (ref 15–41)
Alkaline Phosphatase: 43 U/L (ref 38–126)
BUN: 12 mg/dL (ref 6–20)
CHLORIDE: 107 mmol/L (ref 101–111)
CO2: 23 mmol/L (ref 22–32)
Calcium: 8.9 mg/dL (ref 8.9–10.3)
Creatinine, Ser: 0.82 mg/dL (ref 0.44–1.00)
GFR calc Af Amer: >60 mL/min (ref >60)
GFR calc non Af Amer: >60 mL/min (ref >60)
Glucose, Bld: 93 mg/dL (ref 65–99)
POTASSIUM: 4.4 mmol/L (ref 3.5–5.1)
SODIUM: 138 mmol/L (ref 135–145)
Total Bilirubin: 0.7 mg/dL (ref 0.3–1.2)
Total Protein: 7.4 g/dL (ref 6.5–8.1)

## 2015-06-27 LAB — URINALYSIS, ROUTINE W REFLEX MICROSCOPIC
BILIRUBIN URINE: NEGATIVE
GLUCOSE, UA: NEGATIVE mg/dL
Hgb urine dipstick: NEGATIVE
KETONES UR: NEGATIVE mg/dL
Nitrite: NEGATIVE
PH: 7.5 (ref 5.0–8.0)
Protein, ur: NEGATIVE mg/dL
SPECIFIC GRAVITY, URINE: 1.019 (ref 1.005–1.030)

## 2015-06-27 LAB — URINE MICROSCOPIC-ADD ON

## 2015-06-27 LAB — CBC
HEMATOCRIT: 46.1 % — AB (ref 36.0–46.0)
HEMOGLOBIN: 15.8 g/dL — AB (ref 12.0–15.0)
MCH: 30.9 pg (ref 26.0–34.0)
MCHC: 34.3 g/dL (ref 30.0–36.0)
MCV: 90.2 fL (ref 78.0–100.0)
Platelets: 307 10*3/uL (ref 150–400)
RBC: 5.11 MIL/uL (ref 3.87–5.11)
RDW: 12.5 % (ref 11.5–15.5)
WBC: 8.4 10*3/uL (ref 4.0–10.5)

## 2015-06-27 LAB — LIPASE, BLOOD: LIPASE: 34 U/L (ref 11–51)

## 2015-06-27 LAB — GC/CHLAMYDIA PROBE AMP (~~LOC~~) NOT AT ARMC
CHLAMYDIA, DNA PROBE: NEGATIVE
NEISSERIA GONORRHEA: NEGATIVE

## 2015-06-27 MED ORDER — HYDROCODONE-ACETAMINOPHEN 5-325 MG PO TABS: 1.0000 | ORAL_TABLET | Freq: Four times a day (QID) | ORAL | Status: DC | PRN

## 2015-06-27 NOTE — ED Provider Notes (Signed)
CSN: [id number]     Arrival date & time 06/27/15  1115 History  By signing my name below, I, Bethel Born, attest that this documentation has been prepared under the direction and in the presence of Geoffery Lyons, MD. Electronically Signed: Bethel Born, ED Scribe. 06/27/2015. 3:35 PM   Chief Complaint  Patient presents with  . Abdominal Pain   The history is provided by the patient. No language interpreter was used.   Carol [name] is a 30 y.o. female who presents to the Emergency Department complaining of constant, stabbing, 9/10 in severity, pelvic pain with onset 5 days ago. Pt states that she was diagnosed with PID 2 days ago and discharged with doxycycline and clindamycin  but the pain has worsened. She is out of pain medication.  Associated symptoms include left sided back pain, left sided abdominal pain,  and abnormal vaginal discharge. She has an appointment with her gynecologist in 3 days.   Past Medical History  Diagnosis Date  . Bronchitis   . Anxiety   . Hypertension   . Urinary tract infection   . Irritable bowel   . Depression   . Asthma   . Increased frequency of headaches   . Fatty liver disease, nonalcoholic   . Migraines   . Kidney stones   . Gall stones    Past Surgical History  Procedure Laterality Date  . No past surgeries     Family History  Problem Relation Age of Onset  . Physical abuse Mother   . Anxiety disorder Mother   . ADD / ADHD Mother   . Paranoid behavior Mother   . Drug abuse Father   . Depression Father   . Alcohol abuse Maternal Grandfather   . ADD / ADHD Maternal Grandmother   . Anxiety disorder Maternal Grandmother   . Dementia Maternal Grandmother   . OCD Maternal Grandmother   . Sexual abuse Maternal Grandmother   . Alcohol abuse Paternal Grandfather   . Bipolar disorder Cousin   . Schizophrenia Neg Hx   . Seizures Neg Hx    Social History  Substance Use Topics  . Smoking status: Former Smoker -- 1.00 packs/day    Types: Cigarettes  . Smokeless tobacco: Never Used  . Alcohol Use: No   OB History    Gravida Para Term Preterm AB TAB SAB Ectopic Multiple Living       Review of Systems  10 Systems reviewed and all are negative for acute change except as noted in the HPI.  Allergies  Sulfa antibiotics; Pseudoephedrine; Cephalosporins; Ciprocin-fluocin-procin; Ciprofloxacin hcl; Flagyl; Hydrochlorothiazide; and Penicillins  Home Medications   Prior to Admission medications   Medication Sig Start Date End Date Taking? Authorizing Provider  clindamycin (CLEOCIN) 150 MG capsule Take 2 capsules (300 mg total) by mouth 2 (two) times daily. May dispense as  capsules 06/25/15   Mady Gemma, PA-C  doxycycline (VIBRAMYCIN) 100 MG capsule Take 1 capsule (100 mg total) by mouth 2 (two) times daily. 06/25/15   Mady Gemma, PA-C  HYDROcodone-acetaminophen (NORCO/VICODIN) 5-325 MG tablet Take 1 tablet by mouth every 4 (four) hours as needed. 06/25/15   Mady Gemma, PA-C   BP 148/99 mmHg  Pulse 98  Temp(Src) 98.3 F (36.8 C) (Oral)  Resp 18  Ht  (1.651 m)  Wt 214 lb (97.07 kg)  BMI 35.61 kg/m2  SpO2 100%  LMP 05/27/2015 Physical Exam  Constitutional:  She is oriented to person, place, and time. She appears well-developed and well-nourished. No distress.  HENT:  Head: Normocephalic and atraumatic.  Eyes: EOM are normal.  Neck: Normal range of motion.  Cardiovascular: Normal rate, regular rhythm and normal heart sounds.   Pulmonary/Chest: Effort normal and breath sounds normal.  Abdominal: Soft. She exhibits no distension. There is tenderness.  Mild TTP in the left flank and LLQ  Musculoskeletal: Normal range of motion.  Neurological: She is alert and oriented to person, place, and time.  Skin: Skin is warm and dry.  Psychiatric: She has a normal mood and affect. Judgment normal.  Nursing note and vitals reviewed.   ED Course  Procedures  (including critical care time) DIAGNOSTIC STUDIES: Oxygen Saturation is 100% on RA,  normal by my interpretation.    COORDINATION OF CARE: 3:33 PM Discussed treatment plan which includes lab work and discharge with pain medication with pt at bedside and pt agreed to plan.  Labs Review Labs Reviewed  CBC - Abnormal; Notable for the following:    Hemoglobin 15.8 (*)    HCT 46.1 (*)    All other components within normal limits  URINALYSIS, ROUTINE W REFLEX MICROSCOPIC (NOT AT [facility]) - Abnormal; Notable for the following:    APPearance CLOUDY (*)    Leukocytes, UA LARGE (*)    All other components within normal limits  URINE MICROSCOPIC-ADD ON - Abnormal; Notable for the following:    Squamous Epithelial / LPF 6-30 (*)    Bacteria, UA MANY (*)    All other components within normal limits  LIPASE, BLOOD  COMPREHENSIVE METABOLIC PANEL    Imaging Review No results found. I have personally reviewed and evaluated these lab results as part of my medical decision-making.   EKG Interpretation None      MDM   Final diagnoses:  None    Patient returns with complaints of abdominal pain. She had a full workup performed just over 24 hours ago. Her laboratory studies are unchanged. She remains afebrile, has no white count, and urine is basically clear. She will be advised to continue the previous treatment plan and follow-up with her primary Dr. if not improving.  I personally performed the services described in this documentation, which was scribed in my presence. The recorded information has been reviewed and is accurate.       Geoffery Lyons, MD 06/27/15 2350

## 2015-06-27 NOTE — Discharge Instructions (Signed)
Hydrocodone as prescribed as needed for pain.  Please follow-up with your primary Dr. if not improving in the next 2-3 days.   Flank Pain Flank pain refers to pain that is located on the side of the body between the upper abdomen and the back. The pain may occur over a short period of time (acute) or may be long-term or reoccurring (chronic). It may be mild or severe. Flank pain can be caused by many things. CAUSES  Some of the more common causes of flank pain include:  Muscle strains.   Muscle spasms.   A disease of your spine (vertebral disk disease).   A lung infection (pneumonia).   Fluid around your lungs (pulmonary edema).   A kidney infection.   Kidney stones.   A very painful skin rash caused by the chickenpox virus (shingles).   Gallbladder disease.  HOME CARE INSTRUCTIONS  Home care will depend on the cause of your pain. In general,  Rest as directed by your caregiver.  Drink enough fluids to keep your urine clear or pale yellow.  Only take over-the-counter or prescription medicines as directed by your caregiver. Some medicines may help relieve the pain.  Tell your caregiver about any changes in your pain.  Follow up with your caregiver as directed. SEEK IMMEDIATE MEDICAL CARE IF:   Your pain is not controlled with medicine.   You have new or worsening symptoms.  Your pain increases.   You have abdominal pain.   You have shortness of breath.   You have persistent nausea or vomiting.   You have swelling in your abdomen.   You feel faint or pass out.   You have blood in your urine.  You have a fever or persistent symptoms for more than 2-3 days.  You have a fever and your symptoms suddenly get worse. MAKE SURE YOU:   Understand these instructions.  Will watch your condition.  Will get help right away if you are not doing well or get worse.   This information is not intended to replace advice given to you by your health care  provider. Make sure you discuss any questions you have with your health care provider.   Document Released: 06/07/2005 Document Revised: 01/09/2012 Document Reviewed: 11/29/2011 Elsevier Interactive Patient Education [employer]

## 2015-06-27 NOTE — ED Notes (Signed)
Pt given d/c instructions as per chart. Verbalizes understanding. No questions. Rx x 1 with narc prec instructions

## 2015-06-27 NOTE — ED Notes (Addendum)
Pt seen here 2 days ago for lower, mid abdominal pain. She sts the pain  And N/V continues. Pt now also has diarrhea. Pt did get her Rx's filled and has been taking them. Pt sts she is out of vicodin.

## 2015-12-12 ENCOUNTER — Encounter (HOSPITAL_BASED_OUTPATIENT_CLINIC_OR_DEPARTMENT_OTHER): Payer: Self-pay | Admitting: *Deleted

## 2015-12-12 ENCOUNTER — Emergency Department (HOSPITAL_BASED_OUTPATIENT_CLINIC_OR_DEPARTMENT_OTHER): Payer: Self-pay

## 2015-12-12 ENCOUNTER — Emergency Department (HOSPITAL_BASED_OUTPATIENT_CLINIC_OR_DEPARTMENT_OTHER)
Admission: EM | Admit: 2015-12-12 | Discharge: 2015-12-12 | Disposition: A | Discharge status: Home / Self Care | Payer: Self-pay | Attending: Emergency Medicine | Admitting: Emergency Medicine

## 2015-12-12 DIAGNOSIS — J45909 Unspecified asthma, uncomplicated: Secondary | ICD-10-CM | POA: Insufficient documentation

## 2015-12-12 DIAGNOSIS — N730 Acute parametritis and pelvic cellulitis: Secondary | ICD-10-CM

## 2015-12-12 DIAGNOSIS — N390 Urinary tract infection, site not specified: Secondary | ICD-10-CM | POA: Insufficient documentation

## 2015-12-12 DIAGNOSIS — Z87891 Personal history of nicotine dependence: Secondary | ICD-10-CM | POA: Insufficient documentation

## 2015-12-12 DIAGNOSIS — R102 Pelvic and perineal pain: Secondary | ICD-10-CM

## 2015-12-12 DIAGNOSIS — I10 Essential (primary) hypertension: Secondary | ICD-10-CM | POA: Insufficient documentation

## 2015-12-12 DIAGNOSIS — N739 Female pelvic inflammatory disease, unspecified: Secondary | ICD-10-CM | POA: Insufficient documentation

## 2015-12-12 DIAGNOSIS — Z79899 Other long term (current) drug therapy: Secondary | ICD-10-CM | POA: Insufficient documentation

## 2015-12-12 HISTORY — DX: Malingerer (conscious simulation): Z76.5

## 2015-12-12 LAB — BASIC METABOLIC PANEL
Anion gap: 6 (ref 5–15)
BUN: 10 mg/dL (ref 6–20)
CALCIUM: 8.9 mg/dL (ref 8.9–10.3)
CO2: 27 mmol/L (ref 22–32)
CREATININE: 0.75 mg/dL (ref 0.44–1.00)
Chloride: 106 mmol/L (ref 101–111)
Glucose, Bld: 75 mg/dL (ref 65–99)
Potassium: 4.2 mmol/L (ref 3.5–5.1)
Sodium: 139 mmol/L (ref 135–145)

## 2015-12-12 LAB — CBC WITH DIFFERENTIAL/PLATELET
Basophils Absolute: 0 10*3/uL (ref 0.0–0.1)
Basophils Relative: 0 %
Eosinophils Absolute: 0.3 10*3/uL (ref 0.0–0.7)
Eosinophils Relative: 3 %
HCT: 41.9 % (ref 36.0–46.0)
Hemoglobin: 14.1 g/dL (ref 12.0–15.0)
Lymphocytes Relative: 27 %
Lymphs Abs: 2.8 10*3/uL (ref 0.7–4.0)
MCH: 30.3 pg (ref 26.0–34.0)
MCHC: 33.7 g/dL (ref 30.0–36.0)
MCV: 89.9 fL (ref 78.0–100.0)
Monocytes Absolute: 1.2 10*3/uL — ABNORMAL HIGH (ref 0.1–1.0)
Monocytes Relative: 12 %
Neutro Abs: 5.9 10*3/uL (ref 1.7–7.7)
Neutrophils Relative %: 58 %
Platelets: 317 10*3/uL (ref 150–400)
RBC: 4.66 MIL/uL (ref 3.87–5.11)
RDW: 12.1 % (ref 11.5–15.5)
WBC: 10.2 10*3/uL (ref 4.0–10.5)

## 2015-12-12 LAB — URINALYSIS, ROUTINE W REFLEX MICROSCOPIC
Bilirubin Urine: NEGATIVE
GLUCOSE, UA: NEGATIVE mg/dL
HGB URINE DIPSTICK: NEGATIVE
Ketones, ur: NEGATIVE mg/dL
Nitrite: NEGATIVE
Protein, ur: NEGATIVE mg/dL
SPECIFIC GRAVITY, URINE: 1.018 (ref 1.005–1.030)
pH: 8 (ref 5.0–8.0)

## 2015-12-12 LAB — PREGNANCY, URINE: PREG TEST UR: NEGATIVE

## 2015-12-12 LAB — WET PREP, GENITAL
Sperm: NONE SEEN
Trich, Wet Prep: NONE SEEN
YEAST WET PREP: NONE SEEN

## 2015-12-12 LAB — URINE MICROSCOPIC-ADD ON: RBC / HPF: NONE SEEN RBC/hpf (ref 0–5)

## 2015-12-12 MED ORDER — DIPHENHYDRAMINE HCL 50 MG/ML IJ SOLN: 25.0000 mg | Freq: Once | INTRAMUSCULAR | Status: DC

## 2015-12-12 MED ORDER — NITROFURANTOIN MONOHYD MACRO 100 MG PO CAPS: 100.0000 mg | ORAL_CAPSULE | Freq: Two times a day (BID) | ORAL | 0 refills | Status: DC

## 2015-12-12 MED ORDER — LIDOCAINE HCL (PF) 1 % IJ SOLN
INTRAMUSCULAR | Status: AC
  Administered 2015-12-12: 0.9 mL
  Filled 2015-12-12: qty 5

## 2015-12-12 MED ORDER — DOXYCYCLINE HYCLATE 100 MG PO TABS
100.0000 mg | ORAL_TABLET | Freq: Once | ORAL | Status: AC
  Administered 2015-12-12: 100 mg via ORAL
  Filled 2015-12-12: qty 1

## 2015-12-12 MED ORDER — OXYCODONE-ACETAMINOPHEN 5-325 MG PO TABS
1.0000 | ORAL_TABLET | Freq: Once | ORAL | Status: AC
  Administered 2015-12-12: 1 via ORAL
  Filled 2015-12-12: qty 1

## 2015-12-12 MED ORDER — TRAMADOL HCL 50 MG PO TABS: 50.0000 mg | ORAL_TABLET | Freq: Four times a day (QID) | ORAL | 0 refills | Status: DC | PRN

## 2015-12-12 MED ORDER — IBUPROFEN 600 MG PO TABS: 600.0000 mg | ORAL_TABLET | Freq: Four times a day (QID) | ORAL | 0 refills | Status: DC | PRN

## 2015-12-12 MED ORDER — ONDANSETRON 8 MG PO TBDP
8.0000 mg | ORAL_TABLET | Freq: Once | ORAL | Status: AC
  Administered 2015-12-12: 8 mg via ORAL
  Filled 2015-12-12: qty 1

## 2015-12-12 MED ORDER — DOXYCYCLINE HYCLATE 100 MG PO CAPS: 100.0000 mg | ORAL_CAPSULE | Freq: Two times a day (BID) | ORAL | 0 refills | Status: DC

## 2015-12-12 MED ORDER — FENTANYL CITRATE (PF) 100 MCG/2ML IJ SOLN
50.0000 ug | Freq: Once | INTRAMUSCULAR | Status: AC
  Administered 2015-12-12: 50 ug via INTRAVENOUS
  Filled 2015-12-12: qty 2

## 2015-12-12 MED ORDER — CEFTRIAXONE SODIUM 250 MG IJ SOLR
250.0000 mg | Freq: Once | INTRAMUSCULAR | Status: AC
  Administered 2015-12-12: 250 mg via INTRAMUSCULAR
  Filled 2015-12-12: qty 250

## 2015-12-12 NOTE — ED Notes (Signed)
PA at bedside.

## 2015-12-12 NOTE — ED Triage Notes (Signed)
Lower abdominal pain since yesterday. Vaginal discharge. Painful sex.

## 2015-12-12 NOTE — ED Notes (Signed)
Patient transported to Ultrasound 

## 2015-12-12 NOTE — ED Notes (Signed)
Pt attempted to provide a urine specimen, but was unable to at this time.

## 2015-12-12 NOTE — Discharge Instructions (Signed)
Ibuprofen and tramadol for pain. Doxycycline as prescribed until all gone. Macrobid for UTI. Follow up with your family doctor for recheck. Return if worsening.

## 2015-12-12 NOTE — ED Provider Notes (Signed)
[facility] Provider Note   CSN: [id number] Arrival date & time: 12/12/15  1110     History   Chief Complaint Chief Complaint  Patient presents with  . Abdominal Pain    HPI Carol [name] is a 30 y.o. female.  HPI Carol [name] is a 30 y.o. female with history of anxiety, depression, hypertension, irritable bowel disease, kidney stones, UTIs, presents to emergency department complaining of pelvic pain. Patient states she has had pain for the last 2 days. She reports vaginal discharge and pain with intercourse. She denies any urinary symptoms. She states this does not feel like a urinary tract infection which she has had in the past. She denies being pregnant. She reports associated nausea and vomiting. Denies fever. No treatment prior to coming in.  Past Medical History:  Diagnosis Date  . Anxiety   . Asthma   . Bronchitis   . Depression   . Fatty liver disease, nonalcoholic   . Gall stones   . Hypertension   . Increased frequency of headaches   . Irritable bowel   . Kidney stones   . Migraines   . Urinary tract infection     Patient Active Problem List   Diagnosis Date Noted  . MDD (major depressive disorder), recurrent severe, without psychosis (HCC) 11/01/2013  . 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 Borderline behavior 05/08/2012  . ADHD (attention deficit hyperactivity disorder) 05/08/2012  . Insomnia secondary to depression with anxiety 05/08/2012    Past Surgical History:  Procedure Laterality Date  . NO PAST SURGERIES      OB History    Gravida Para Term Preterm AB Living   0 0 0 0 0 0   SAB TAB Ectopic Multiple Live Births   0 0 0 0         Home Medications    Prior to Admission medications   Medication Sig Start Date End Date Taking? Authorizing Provider  ALPRAZOLAM PO Take by mouth.   Yes Historical Provider, MD  BuPROPion HCl (WELLBUTRIN PO) Take by mouth.   Yes Historical Provider, MD  clindamycin (CLEOCIN) 150 MG capsule Take  2 capsules (300 mg total) by mouth 2 (two) times daily. May dispense as 150mg  capsules 06/25/15   Mady Gemma, PA-C  doxycycline (VIBRAMYCIN) 100 MG capsule Take 1 capsule (100 mg total) by mouth 2 (two) times daily. 06/25/15   Mady Gemma, PA-C  HYDROcodone-acetaminophen (NORCO) 5-325 MG tablet Take 1-2 tablets by mouth every 6 (six) hours as needed. 06/27/15   Geoffery Lyons, MD    Family History Family History  Problem Relation Age of Onset  . Physical abuse Mother   . Anxiety disorder Mother   . ADD / ADHD Mother   . Paranoid behavior Mother   . Drug abuse Father   . Depression Father   . Alcohol abuse Maternal Grandfather   . ADD / ADHD Maternal Grandmother   . Anxiety disorder Maternal Grandmother   . Dementia Maternal Grandmother   . OCD Maternal Grandmother   . Sexual abuse Maternal Grandmother   . Alcohol abuse Paternal Grandfather   . Bipolar disorder Cousin   . Schizophrenia Neg Hx   . Seizures Neg Hx     Social History Social History  Substance Use Topics  . Smoking status: Former Smoker    Packs/day: 1.00    Types: Cigarettes  . Smokeless tobacco: Never Used  . Alcohol use No     Allergies  Sulfa antibiotics; Pseudoephedrine; Cephalosporins; Ciprocin-fluocin-procin [fluocinolone acetonide]; Ciprofloxacin hcl; Flagyl [metronidazole]; Hydrochlorothiazide; and Penicillins   Review of Systems Review of Systems  Constitutional: Negative for chills and fever.  Respiratory: Negative for cough, chest tightness and shortness of breath.   Cardiovascular: Negative for chest pain, palpitations and leg swelling.  Gastrointestinal: Positive for abdominal pain, nausea and vomiting. Negative for constipation and diarrhea.  Genitourinary: Positive for pelvic pain, vaginal discharge and vaginal pain. Negative for dysuria, flank pain and vaginal bleeding.  Musculoskeletal: Negative for arthralgias, myalgias, neck pain and neck stiffness.  Skin: Negative for  rash.  Neurological: Negative for dizziness, weakness and headaches.  All other systems reviewed and are negative.    Physical Exam Updated Vital Signs BP 127/79 (BP Location: Right Arm)   Pulse 108   Temp 98.1 F (36.7 C) (Oral)   Resp 18   Ht 5\' 6"  (1.676 m)   Wt 94.3 kg   LMP 11/25/2015   SpO2 100%   BMI 33.57 kg/m   Physical Exam  Constitutional: She appears well-developed and well-nourished. No distress.  HENT:  Head: Normocephalic.  Eyes: Conjunctivae are normal.  Neck: Neck supple.  Cardiovascular: Normal rate, regular rhythm and normal heart sounds.   Pulmonary/Chest: Effort normal and breath sounds normal. No respiratory distress. She has no wheezes. She has no rales.  Abdominal: Soft. Bowel sounds are normal. She exhibits no distension. There is tenderness. There is no rebound.  Suprapubic tenderness  Genitourinary:  Genitourinary Comments: Normal external genitalia. White moderate vaginal discharge. Cervical motion tenderness present. Suprapubic tenderness. Bilateral adnexal tenderness.   Musculoskeletal: She exhibits no edema.  Neurological: She is alert.  Skin: Skin is warm and dry.  Psychiatric: She has a normal mood and affect. Her behavior is normal.  Nursing note and vitals reviewed.    ED Treatments / Results  Labs (all labs ordered are listed, but only abnormal results are displayed) Labs Reviewed  WET PREP, GENITAL - Abnormal; Notable for the following:       Result Value   Clue Cells Wet Prep HPF POC PRESENT (*)    WBC, Wet Prep HPF POC MANY (*)    All other components within normal limits  URINALYSIS, ROUTINE W REFLEX MICROSCOPIC (NOT AT [facility]) - Abnormal; Notable for the following:    APPearance CLOUDY (*)    Leukocytes, UA TRACE (*)    All other components within normal limits  CBC WITH DIFFERENTIAL/PLATELET - Abnormal; Notable for the following:    Monocytes Absolute 1.2 (*)    All other components within normal limits  URINE  MICROSCOPIC-ADD ON - Abnormal; Notable for the following:    Squamous Epithelial / LPF 0-5 (*)    Bacteria, UA MANY (*)    All other components within normal limits  PREGNANCY, URINE  BASIC METABOLIC PANEL  GC/CHLAMYDIA PROBE AMP ([place]) NOT AT [facility]    EKG  EKG Interpretation None       Radiology [place] Transvaginal Non-ob  Result Date: 12/12/2015 CLINICAL DATA:  Left lower abdominal and pelvic pain with nausea and diarrhea EXAM: TRANSABDOMINAL AND TRANSVAGINAL ULTRASOUND OF PELVIS DOPPLER ULTRASOUND OF OVARIES TECHNIQUE: Study was performed transabdominally to optimize pelvic field of view evaluation and transvaginally to optimize internal visceral architecture evaluation. Color and duplex Doppler ultrasound was utilized to evaluate blood flow to the ovaries. COMPARISON:  CT abdomen and pelvis June 25, 2015 CT: Pelvic ultrasound June 25, 2015 FINDINGS: Uterus Measurements: 8.0 x 3.4 x 4.5 cm. No fibroids or  other mass visualized. Uterus is anteverted. A small amount of fluid is noted in the endocervical canal. Endometrium Thickness: 10 mm.  No focal abnormality visualized. Right ovary Measurements: 2.9 x 2.1 x 2.1 cm. Normal appearance/no adnexal mass. Left ovary Measurements: 3.4 x 2.3 x 1.8 cm. Normal appearance/no adnexal mass. Pulsed Doppler evaluation of both ovaries demonstrates normal low-resistance arterial and venous waveforms. Other findings There is a small amount of free fluid in the right ovary. IMPRESSION: No ovarian mass or torsion on either side. Small amount of free fluid near the right ovary could represent recent ovarian cyst rupture. Small amount of fluid in the endocervical canal of doubtful clinical significance. No intrauterine mass or endometrial mass seen. Electronically Signed   By: Bretta BangWilliam  Woodruff III M.D.   On: 12/12/2015 16:07   [place] Pelvis Complete  Result Date: 12/12/2015 CLINICAL DATA:  Left lower abdominal and pelvic pain with nausea and diarrhea  EXAM: TRANSABDOMINAL AND TRANSVAGINAL ULTRASOUND OF PELVIS DOPPLER ULTRASOUND OF OVARIES TECHNIQUE: Study was performed transabdominally to optimize pelvic field of view evaluation and transvaginally to optimize internal visceral architecture evaluation. Color and duplex Doppler ultrasound was utilized to evaluate blood flow to the ovaries. COMPARISON:  CT abdomen and pelvis June 25, 2015 CT: Pelvic ultrasound June 25, 2015 FINDINGS: Uterus Measurements: 8.0 x 3.4 x 4.5 cm. No fibroids or other mass visualized. Uterus is anteverted. A small amount of fluid is noted in the endocervical canal. Endometrium Thickness: 10 mm.  No focal abnormality visualized. Right ovary Measurements: 2.9 x 2.1 x 2.1 cm. Normal appearance/no adnexal mass. Left ovary Measurements: 3.4 x 2.3 x 1.8 cm. Normal appearance/no adnexal mass. Pulsed Doppler evaluation of both ovaries demonstrates normal low-resistance arterial and venous waveforms. Other findings There is a small amount of free fluid in the right ovary. IMPRESSION: No ovarian mass or torsion on either side. Small amount of free fluid near the right ovary could represent recent ovarian cyst rupture. Small amount of fluid in the endocervical canal of doubtful clinical significance. No intrauterine mass or endometrial mass seen. Electronically Signed   By: Bretta BangWilliam  Woodruff III M.D.   On: 12/12/2015 16:07   [place] Art/ven Flow Abd Pelv Doppler  Result Date: 12/12/2015 CLINICAL DATA:  Left lower abdominal and pelvic pain with nausea and diarrhea EXAM: TRANSABDOMINAL AND TRANSVAGINAL ULTRASOUND OF PELVIS DOPPLER ULTRASOUND OF OVARIES TECHNIQUE: Study was performed transabdominally to optimize pelvic field of view evaluation and transvaginally to optimize internal visceral architecture evaluation. Color and duplex Doppler ultrasound was utilized to evaluate blood flow to the ovaries. COMPARISON:  CT abdomen and pelvis June 25, 2015 CT: Pelvic ultrasound June 25, 2015  FINDINGS: Uterus Measurements: 8.0 x 3.4 x 4.5 cm. No fibroids or other mass visualized. Uterus is anteverted. A small amount of fluid is noted in the endocervical canal. Endometrium Thickness: 10 mm.  No focal abnormality visualized. Right ovary Measurements: 2.9 x 2.1 x 2.1 cm. Normal appearance/no adnexal mass. Left ovary Measurements: 3.4 x 2.3 x 1.8 cm. Normal appearance/no adnexal mass. Pulsed Doppler evaluation of both ovaries demonstrates normal low-resistance arterial and venous waveforms. Other findings There is a small amount of free fluid in the right ovary. IMPRESSION: No ovarian mass or torsion on either side. Small amount of free fluid near the right ovary could represent recent ovarian cyst rupture. Small amount of fluid in the endocervical canal of doubtful clinical significance. No intrauterine mass or endometrial mass seen. Electronically Signed   By: Bretta BangWilliam  Woodruff III M.D.  On: 12/12/2015 16:07    Procedures Procedures (including critical care time)  Medications Ordered in ED Medications  oxyCODONE-acetaminophen (PERCOCET/ROXICET) 5-325 MG per tablet 1 tablet (not administered)     Initial Impression / Assessment and Plan / ED Course  I have reviewed the triage vital signs and the nursing notes.  Pertinent labs & imaging results that were available during my care of the patient were reviewed by me and considered in my medical decision making (see chart for details).  Clinical Course   1:41 PM Pt seen and examined. Pt with pelvic pain and vaginal dc. Pt is tearful now. Will give more medications and do pelvic exam. Labs pending.   [place] negative. Will treat for PID. Pt received Rocephin 250mg  IM. Will place on doxycycline 100mg  BID for 10 days. Also treat uti with macrobid. Pt otherwise afebrile. No vomiting in ED. Tolerating POs. No acute abdomen. Will dc home with close outpatient follow up. Return precautions discussed.  Final Clinical Impressions(s) / ED Diagnoses    Final diagnoses:  Pelvic pain in female  PID (acute pelvic inflammatory disease)  UTI (lower urinary tract infection)    New Prescriptions Discharge Medication List as of 12/12/2015  4:34 PM    START taking these medications   Details  ibuprofen (ADVIL,MOTRIN) 600 MG tablet Take 1 tablet (600 mg total) by mouth every 6 (six) hours as needed., Starting Mon 12/12/2015, Print    nitrofurantoin, macrocrystal-monohydrate, (MACROBID) 100 MG capsule Take 1 capsule (100 mg total) by mouth 2 (two) times daily., Starting Mon 12/12/2015, Print    traMADol (ULTRAM) 50 MG tablet Take 1 tablet (50 mg total) by mouth every 6 (six) hours as needed., Starting Mon 12/12/2015, Print         Jaynie Crumble, PA-C 12/13/15 1110    Jerelyn Scott, MD 12/13/15 1115

## 2015-12-12 NOTE — ED Notes (Signed)
At discharge patient threw d/c instructions along with medications in the trash.

## 2015-12-13 LAB — GC/CHLAMYDIA PROBE AMP (~~LOC~~) NOT AT ARMC
Chlamydia: NEGATIVE
NEISSERIA GONORRHEA: NEGATIVE

## 2016-01-28 ENCOUNTER — Encounter: Payer: Self-pay | Admitting: Emergency Medicine

## 2016-01-28 ENCOUNTER — Emergency Department
Admission: EM | Admit: 2016-01-28 | Discharge: 2016-01-28 | Disposition: A | Discharge status: Home / Self Care | Payer: Self-pay | Attending: Emergency Medicine | Admitting: Emergency Medicine

## 2016-01-28 ENCOUNTER — Emergency Department: Payer: Self-pay

## 2016-01-28 DIAGNOSIS — R0789 Other chest pain: Secondary | ICD-10-CM | POA: Insufficient documentation

## 2016-01-28 DIAGNOSIS — Z87891 Personal history of nicotine dependence: Secondary | ICD-10-CM | POA: Insufficient documentation

## 2016-01-28 DIAGNOSIS — Z79899 Other long term (current) drug therapy: Secondary | ICD-10-CM | POA: Insufficient documentation

## 2016-01-28 DIAGNOSIS — R51 Headache: Secondary | ICD-10-CM | POA: Insufficient documentation

## 2016-01-28 DIAGNOSIS — F909 Attention-deficit hyperactivity disorder, unspecified type: Secondary | ICD-10-CM | POA: Insufficient documentation

## 2016-01-28 DIAGNOSIS — J45909 Unspecified asthma, uncomplicated: Secondary | ICD-10-CM | POA: Insufficient documentation

## 2016-01-28 DIAGNOSIS — I10 Essential (primary) hypertension: Secondary | ICD-10-CM | POA: Insufficient documentation

## 2016-01-28 LAB — CBC
HEMATOCRIT: 42.6 % (ref 35.0–47.0)
HEMOGLOBIN: 14.6 g/dL (ref 12.0–16.0)
MCH: 30.2 pg (ref 26.0–34.0)
MCHC: 34.3 g/dL (ref 32.0–36.0)
MCV: 88.1 fL (ref 80.0–100.0)
Platelets: 287 10*3/uL (ref 150–440)
RBC: 4.84 MIL/uL (ref 3.80–5.20)
RDW: 12.3 % (ref 11.5–14.5)
WBC: 8.7 10*3/uL (ref 3.6–11.0)

## 2016-01-28 LAB — BASIC METABOLIC PANEL
ANION GAP: 6 (ref 5–15)
BUN: 14 mg/dL (ref 6–20)
CO2: 24 mmol/L (ref 22–32)
Calcium: 8.9 mg/dL (ref 8.9–10.3)
Chloride: 109 mmol/L (ref 101–111)
Creatinine, Ser: 0.69 mg/dL (ref 0.44–1.00)
GFR calc Af Amer: >60 mL/min (ref >60)
GLUCOSE: 97 mg/dL (ref 65–99)
POTASSIUM: 3.9 mmol/L (ref 3.5–5.1)
Sodium: 139 mmol/L (ref 135–145)

## 2016-01-28 LAB — TROPONIN I: Troponin I: <0.03 ng/mL (ref <0.03)

## 2016-01-28 LAB — POCT PREGNANCY, URINE: PREG TEST UR: NEGATIVE

## 2016-01-28 MED ORDER — BUTALBITAL-APAP-CAFFEINE 50-325-40 MG PO TABS
2.0000 | ORAL_TABLET | Freq: Once | ORAL | Status: AC
  Administered 2016-01-28: 2 via ORAL
  Filled 2016-01-28: qty 2

## 2016-01-28 MED ORDER — BUTALBITAL-APAP-CAFFEINE 50-325-40 MG PO TABS: 1.0000 | ORAL_TABLET | Freq: Four times a day (QID) | ORAL | 0 refills | Status: DC | PRN

## 2016-01-28 MED ORDER — ACETAMINOPHEN 325 MG PO TABS
650.0000 mg | ORAL_TABLET | Freq: Once | ORAL | Status: AC
  Administered 2016-01-28: 650 mg via ORAL
  Filled 2016-01-28: qty 2

## 2016-01-28 NOTE — ED Notes (Signed)
Patient noted to not be in treatment room.  Left prior to receiving discharge instructions.

## 2016-01-28 NOTE — ED Provider Notes (Signed)
[facility] Emergency Department Provider Note   ____________________________________________    I have reviewed the triage vital signs and the nursing notes.   HISTORY  Chief Complaint Chest Pain and Headache     HPI Carol [name] is a 30 y.o. female who presents with complaints of chest pain that began this morning which she describes as sharp in nature in the center of her chest. She reports it has gotten better throughout the day. She denies shortness of breath. No fevers or chills. No cough. No recent travel. No pleurisy. No calf pain or swelling. She notes that she has also developed a headache today which is frontal in nature and throbbing. This is similar to past headaches she has had. She denies neuro deficits. No neck pain.   Past Medical History:  Diagnosis Date  . Anxiety   . Asthma   . Bronchitis   . Depression   . Drug-seeking behavior   . Fatty liver disease, nonalcoholic   . Gall stones   . Hypertension   . Increased frequency of headaches   . Irritable bowel   . Kidney stones   . Migraines   . Urinary tract infection     Patient Active Problem List   Diagnosis Date Noted  . MDD (major depressive disorder), recurrent severe, without psychosis (HCC) 11/01/2013  . 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 Borderline behavior 05/08/2012  . ADHD (attention deficit hyperactivity disorder) 05/08/2012  . Insomnia secondary to depression with anxiety 05/08/2012    Past Surgical History:  Procedure Laterality Date  . NO PAST SURGERIES      Prior to Admission medications   Medication Sig Start Date End Date Taking? Authorizing Provider  ALPRAZolam Prudy Feeler(XANAX) 1 MG tablet Take 1 mg by mouth 3 (three) times daily.   Yes Historical Provider, MD  buPROPion (WELLBUTRIN XL) 150 MG 24 hr tablet Take 150 mg by mouth at bedtime.   Yes Historical Provider, MD  butalbital-acetaminophen-caffeine (FIORICET, ESGIC) [phone] MG tablet Take 1-2 tablets by  mouth every 6 (six) hours as needed for headache. 01/28/16 01/27/17  Jene Everyobert Chianne Byrns, MD  clindamycin (CLEOCIN) 150 MG capsule Take 2 capsules (300 mg total) by mouth 2 (two) times daily. May dispense as 150mg  capsules 06/25/15   Mady GemmaElizabeth C Westfall, PA-C  doxycycline (VIBRAMYCIN) 100 MG capsule Take 1 capsule (100 mg total) by mouth 2 (two) times daily. Patient not taking: Reported on 01/28/2016 12/12/15   Jaynie Crumbleatyana Kirichenko, PA-C  HYDROcodone-acetaminophen (NORCO) 5-325 MG tablet Take 1-2 tablets by mouth every 6 (six) hours as needed. 06/27/15   Geoffery Lyonsouglas Delo, MD  ibuprofen (ADVIL,MOTRIN) 600 MG tablet Take 1 tablet (600 mg total) by mouth every 6 (six) hours as needed. Patient not taking: Reported on 01/28/2016 12/12/15   Tatyana Kirichenko, PA-C  nitrofurantoin, macrocrystal-monohydrate, (MACROBID) 100 MG capsule Take 1 capsule (100 mg total) by mouth 2 (two) times daily. Patient not taking: Reported on 01/28/2016 12/12/15   Tatyana Kirichenko, PA-C  traMADol (ULTRAM) 50 MG tablet Take 1 tablet (50 mg total) by mouth every 6 (six) hours as needed. Patient not taking: Reported on 01/28/2016 12/12/15   Jaynie Crumbleatyana Kirichenko, PA-C     Allergies Sulfa antibiotics; Pseudoephedrine; Cephalosporins; Ciprocin-fluocin-procin [fluocinolone acetonide]; Ciprofloxacin hcl; Flagyl [metronidazole]; Hydrochlorothiazide; and Penicillins  Family History  Problem Relation Age of Onset  . Physical abuse Mother   . Anxiety disorder Mother   . ADD / ADHD Mother   . Paranoid behavior Mother   . Drug abuse  Father   . Depression Father   . Alcohol abuse Maternal Grandfather   . ADD / ADHD Maternal Grandmother   . Anxiety disorder Maternal Grandmother   . Dementia Maternal Grandmother   . OCD Maternal Grandmother   . Sexual abuse Maternal Grandmother   . Alcohol abuse Paternal Grandfather   . Bipolar disorder Cousin   . Schizophrenia Neg Hx   . Seizures Neg Hx     Social History Social History  Substance Use Topics    . Smoking status: Former Smoker    Packs/day: 0.25    Types: Cigarettes  . Smokeless tobacco: Never Used  . Alcohol use No    Review of Systems  Constitutional: No fever/chills Eyes: No visual changes.  ENT: No Neck pain Cardiovascular: As above Respiratory: Denies shortness of breath. Gastrointestinal: No abdominal pain.  No nausea, no vomiting.   Genitourinary: Negative for dysuria. Musculoskeletal: Negative for back pain. Skin: Negative for rash. Neurological: Negative for focal weakness   10-point ROS otherwise negative.  ____________________________________________   PHYSICAL EXAM:  VITAL SIGNS: ED Triage Vitals  Enc Vitals Group     BP 01/28/16 1533 127/72     Pulse Rate 01/28/16 1533 93     Resp 01/28/16 1533 18     Temp 01/28/16 1533 97.7 F (36.5 C)     Temp src --      SpO2 01/28/16 1533 95 %     Weight 01/28/16 1534 209 lb (94.8 kg)     Height 01/28/16 1534 5\' 5"  (1.651 m)     Head Circumference --      Peak Flow --      Pain Score 01/28/16 1534 7     Pain Loc --      Pain Edu? --      Excl. in GC? --     Constitutional: Alert and oriented. No acute distress. Quite anxious Eyes: Conjunctivae are normal. PERRLA, EOMI Head: Atraumatic. Nose: No congestion/rhinnorhea. Mouth/Throat: Mucous membranes are moist.   Neck:  Painless ROM, no meningismus Cardiovascular: Normal rate, regular rhythm. Grossly normal heart sounds.  Good peripheral circulation. Respiratory: Normal respiratory effort.  No retractions. Lungs CTAB. Gastrointestinal: Soft and nontender. No distention.  No CVA tenderness. Genitourinary: deferred Musculoskeletal: No lower extremity tenderness nor edema.  Warm and well perfused Neurologic:  Normal speech and language. No gross focal neurologic deficits are appreciated.  Skin:  Skin is warm, dry and intact. No rash noted. Psychiatric: Mood and affect are normal. Speech and behavior are  normal.  ____________________________________________   LABS (all labs ordered are listed, but only abnormal results are displayed)  Labs Reviewed  BASIC METABOLIC PANEL  CBC  TROPONIN I  POCT PREGNANCY, URINE   ____________________________________________  EKG  ED ECG REPORT I, Jene Every, the attending physician, personally viewed and interpreted this ECG.  Date: 01/28/2016 EKG Time: 3:33 PM Rate: 91 Rhythm: normal sinus rhythm QRS Axis: normal Intervals: normal ST/T Wave abnormalities: normal Conduction Disturbances: none   ____________________________________________  RADIOLOGY  Chest x-ray unremarkable ____________________________________________   PROCEDURES  Procedure(s) performed: No    Critical Care performed: No ____________________________________________   INITIAL IMPRESSION / ASSESSMENT AND PLAN / ED COURSE  Pertinent labs & imaging results that were available during my care of the patient were reviewed by me and considered in my medical decision making (see chart for details).  Patient well-appearing and in no distress. Her EKG is normal. Her lab work is benign. She appears quite comfortable in  the exam room. We will treat with by mouth medications for her headache, she does not have any concerning symptoms such as neck stiffness or pain, fever, neuro deficits, change in vision.  Clinical Course  Value Comment By Time  RBC: 4.84 (Reviewed) Jene Every, MD 09/30 1700   Patient told me that her headache had improved and she was ready to go. I printed discharge instructions but apparently she left prior to receiving them ____________________________________________   FINAL CLINICAL IMPRESSION(S) / ED DIAGNOSES  Final diagnoses:  Atypical chest pain  Headache    NEW MEDICATIONS STARTED DURING THIS VISIT:  Discharge Medication List as of 01/28/2016  7:50 PM       Note:  This document was prepared using Dragon voice  recognition software and may include unintentional dictation errors.    Jene Every, MD 01/28/16 2049

## 2016-01-28 NOTE — ED Notes (Signed)
Patient states she is really to go home.  MD notified.

## 2016-01-28 NOTE — ED Triage Notes (Signed)
Pt presents with 7/10 non-radiating central chest pain starting this morning and 10/10 headache that has progressively worsened today. Taken ibuprofen and tylenol without relief. C/o x3 emesis and feeling flushed.

## 2016-02-15 ENCOUNTER — Ambulatory Visit
Admission: RE | Admit: 2016-02-15 | Discharge: 2016-02-15 | Disposition: A | Discharge status: Home / Self Care | Payer: Self-pay | Source: Ambulatory Visit | Attending: Oncology | Admitting: Oncology

## 2016-02-15 ENCOUNTER — Encounter (INDEPENDENT_AMBULATORY_CARE_PROVIDER_SITE_OTHER): Payer: Self-pay

## 2016-02-15 ENCOUNTER — Ambulatory Visit: Payer: Self-pay | Attending: Oncology

## 2016-02-15 VITALS — BP 124/86 | HR 85 | Temp 98.3°F | Ht 66.54 in | Wt 219.4 lb

## 2016-02-15 DIAGNOSIS — N63 Unspecified lump in unspecified breast: Secondary | ICD-10-CM

## 2016-02-15 DIAGNOSIS — N644 Mastodynia: Secondary | ICD-10-CM

## 2016-02-15 NOTE — Progress Notes (Signed)
Subjective:     Patient ID: Carol [name], female   DOB: 1985/12/19, 30 y.o.   MRN: [id number]  HPI   Review of Systems     Objective:   Physical Exam  Pulmonary/Chest: Right breast exhibits no inverted nipple, no mass, no nipple discharge, no skin change and no tenderness. Left breast exhibits no inverted nipple, no mass, no nipple discharge, no skin change and no tenderness. Breasts are symmetrical.         Assessment:     30 year old patient presents for [facility] clinic visit.  Patient screened, and meets [facility] eligibility.  Patient does not have insurance, Medicare or Medicaid.  Handout given on Affordable Care Act.  Instructed patient on breast self-exam using teach back method.  CBE unremarkable.  Patient complains of tenderness throughout breast tissue.  [place], but unable to follow-up due to father's illness.  Palpated symmetrical nodularity upper inner quadrants,   No mass or lump palpated.      Plan:     Sent for bilateral breast ultrasound.

## 2016-03-14 NOTE — Progress Notes (Unsigned)
Letter mailed from [facility] to notify of normal ultrasound results.  To return for annual mammograms at age 89 or as needed.

## 2016-03-23 ENCOUNTER — Encounter: Payer: Self-pay | Admitting: Emergency Medicine

## 2016-03-23 ENCOUNTER — Emergency Department: Payer: Self-pay

## 2016-03-23 ENCOUNTER — Emergency Department
Admission: EM | Admit: 2016-03-23 | Discharge: 2016-03-23 | Disposition: A | Discharge status: Home / Self Care | Payer: Self-pay | Attending: Emergency Medicine | Admitting: Emergency Medicine

## 2016-03-23 DIAGNOSIS — J45909 Unspecified asthma, uncomplicated: Secondary | ICD-10-CM | POA: Insufficient documentation

## 2016-03-23 DIAGNOSIS — Z79899 Other long term (current) drug therapy: Secondary | ICD-10-CM | POA: Insufficient documentation

## 2016-03-23 DIAGNOSIS — I10 Essential (primary) hypertension: Secondary | ICD-10-CM | POA: Insufficient documentation

## 2016-03-23 DIAGNOSIS — R1013 Epigastric pain: Secondary | ICD-10-CM

## 2016-03-23 DIAGNOSIS — F909 Attention-deficit hyperactivity disorder, unspecified type: Secondary | ICD-10-CM | POA: Insufficient documentation

## 2016-03-23 DIAGNOSIS — Z791 Long term (current) use of non-steroidal anti-inflammatories (NSAID): Secondary | ICD-10-CM | POA: Insufficient documentation

## 2016-03-23 DIAGNOSIS — Z87891 Personal history of nicotine dependence: Secondary | ICD-10-CM | POA: Insufficient documentation

## 2016-03-23 DIAGNOSIS — K8020 Calculus of gallbladder without cholecystitis without obstruction: Secondary | ICD-10-CM | POA: Insufficient documentation

## 2016-03-23 LAB — URINALYSIS COMPLETE WITH MICROSCOPIC (ARMC ONLY)
BILIRUBIN URINE: NEGATIVE
Glucose, UA: NEGATIVE mg/dL
HGB URINE DIPSTICK: NEGATIVE
Ketones, ur: NEGATIVE mg/dL
Nitrite: NEGATIVE
PH: 8 (ref 5.0–8.0)
PROTEIN: NEGATIVE mg/dL
RBC / HPF: NONE SEEN RBC/hpf (ref 0–5)
Specific Gravity, Urine: 1.018 (ref 1.005–1.030)

## 2016-03-23 LAB — BASIC METABOLIC PANEL
ANION GAP: 8 (ref 5–15)
BUN: 11 mg/dL (ref 6–20)
CHLORIDE: 107 mmol/L (ref 101–111)
CO2: 23 mmol/L (ref 22–32)
Calcium: 8.9 mg/dL (ref 8.9–10.3)
Creatinine, Ser: 0.65 mg/dL (ref 0.44–1.00)
GFR calc Af Amer: >60 mL/min (ref >60)
GLUCOSE: 132 mg/dL — AB (ref 65–99)
POTASSIUM: 3.7 mmol/L (ref 3.5–5.1)
Sodium: 138 mmol/L (ref 135–145)

## 2016-03-23 LAB — CBC
HEMATOCRIT: 44.8 % (ref 35.0–47.0)
HEMOGLOBIN: 15.2 g/dL (ref 12.0–16.0)
MCH: 30.1 pg (ref 26.0–34.0)
MCHC: 34 g/dL (ref 32.0–36.0)
MCV: 88.6 fL (ref 80.0–100.0)
Platelets: 300 10*3/uL (ref 150–440)
RBC: 5.06 MIL/uL (ref 3.80–5.20)
RDW: 12.2 % (ref 11.5–14.5)
WBC: 12 10*3/uL — ABNORMAL HIGH (ref 3.6–11.0)

## 2016-03-23 LAB — LIPASE, BLOOD: Lipase: 27 U/L (ref 11–51)

## 2016-03-23 LAB — TROPONIN I: Troponin I: <0.03 ng/mL (ref <0.03)

## 2016-03-23 MED ORDER — ONDANSETRON HCL 4 MG/2ML IJ SOLN
INTRAMUSCULAR | Status: AC
  Filled 2016-03-23: qty 2

## 2016-03-23 MED ORDER — DIPHENHYDRAMINE HCL 25 MG PO CAPS
50.0000 mg | ORAL_CAPSULE | Freq: Once | ORAL | Status: AC
  Administered 2016-03-23: 50 mg via ORAL

## 2016-03-23 MED ORDER — MORPHINE SULFATE (PF) 4 MG/ML IV SOLN
4.0000 mg | Freq: Once | INTRAVENOUS | Status: AC
  Administered 2016-03-23: 4 mg via INTRAVENOUS

## 2016-03-23 MED ORDER — DIPHENHYDRAMINE HCL 25 MG PO CAPS
ORAL_CAPSULE | ORAL | Status: AC
  Administered 2016-03-23: 50 mg via ORAL
  Filled 2016-03-23: qty 2

## 2016-03-23 MED ORDER — ONDANSETRON HCL 4 MG/2ML IJ SOLN
INTRAMUSCULAR | Status: AC
  Administered 2016-03-23: 4 mg via INTRAVENOUS
  Filled 2016-03-23: qty 2

## 2016-03-23 MED ORDER — ONDANSETRON HCL 4 MG PO TABS: 4.0000 mg | ORAL_TABLET | Freq: Three times a day (TID) | ORAL | 0 refills | Status: DC | PRN

## 2016-03-23 MED ORDER — ONDANSETRON HCL 4 MG/2ML IJ SOLN
4.0000 mg | Freq: Once | INTRAMUSCULAR | Status: AC
  Administered 2016-03-23: 4 mg via INTRAVENOUS

## 2016-03-23 MED ORDER — TRAMADOL HCL 50 MG PO TABS: 50.0000 mg | ORAL_TABLET | Freq: Four times a day (QID) | ORAL | 0 refills | Status: DC | PRN

## 2016-03-23 MED ORDER — MORPHINE SULFATE (PF) 4 MG/ML IV SOLN
INTRAVENOUS | Status: AC
  Administered 2016-03-23: 4 mg via INTRAVENOUS
  Filled 2016-03-23: qty 1

## 2016-03-23 MED ORDER — SODIUM CHLORIDE 0.9 % IV BOLUS (SEPSIS)
1000.0000 mL | Freq: Once | INTRAVENOUS | Status: AC
  Administered 2016-03-23: 1000 mL via INTRAVENOUS

## 2016-03-23 MED ORDER — GI COCKTAIL ~~LOC~~
30.0000 mL | Freq: Once | ORAL | Status: AC
  Administered 2016-03-23: 30 mL via ORAL
  Filled 2016-03-23: qty 30

## 2016-03-23 NOTE — Discharge Instructions (Signed)
Please seek medical attention for any high fevers, chest pain, shortness of breath, change in behavior, persistent vomiting, bloody stool or any other new or concerning symptoms.  

## 2016-03-23 NOTE — ED Provider Notes (Signed)
[facility] Emergency Department Provider Note   ____________________________________________   I have reviewed the triage vital signs and the nursing notes.   HISTORY  Chief Complaint Abdominal pain  History limited by: Not Limited   HPI Carol [name] is a 30 y.o. female who presents to the emergency department today with concerns for abdominal pain. Is located in the epigastric region. It started last night. It has progressively gotten worse and more severe. It does radiate up to her chest and into her back. This has been associated with nausea and vomiting. She is not noticing blood in her vomit. She has not had any fevers. States she has a history of gallbladder disease and at one point they recommended her taking it out electively however this never occurred.   Past Medical History:  Diagnosis Date  . Anxiety   . Asthma   . Bronchitis   . Depression   . Drug-seeking behavior   . Fatty liver disease, nonalcoholic   . Gall stones   . Hypertension   . Increased frequency of headaches   . Irritable bowel   . Kidney stones   . Migraines   . Urinary tract infection     Patient Active Problem List   Diagnosis Date Noted  . MDD (major depressive disorder), recurrent severe, without psychosis (HCC) 11/01/2013  . 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 Borderline behavior 05/08/2012  . ADHD (attention deficit hyperactivity disorder) 05/08/2012  . Insomnia secondary to depression with anxiety 05/08/2012    Past Surgical History:  Procedure Laterality Date  . NO PAST SURGERIES      Prior to Admission medications   Medication Sig Start Date End Date Taking? Authorizing Provider  ALPRAZolam Prudy Feeler) 1 MG tablet Take 1 mg by mouth 3 (three) times daily.    Historical Provider, MD  buPROPion (WELLBUTRIN XL) 150 MG 24 hr tablet Take 150 mg by mouth at bedtime.    Historical Provider, MD  butalbital-acetaminophen-caffeine (FIORICET, ESGIC) [phone] MG tablet  Take 1-2 tablets by mouth every 6 (six) hours as needed for headache. 01/28/16 01/27/17  Jene Every, MD  clindamycin (CLEOCIN) 150 MG capsule Take 2 capsules (300 mg total) by mouth 2 (two) times daily. May dispense as 150mg  capsules 06/25/15   Mady Gemma, PA-C  doxycycline (VIBRAMYCIN) 100 MG capsule Take 1 capsule (100 mg total) by mouth 2 (two) times daily. Patient not taking: Reported on 01/28/2016 12/12/15   Jaynie Crumble, PA-C  HYDROcodone-acetaminophen (NORCO) 5-325 MG tablet Take 1-2 tablets by mouth every 6 (six) hours as needed. 06/27/15   Geoffery Lyons, MD  ibuprofen (ADVIL,MOTRIN) 600 MG tablet Take 1 tablet (600 mg total) by mouth every 6 (six) hours as needed. Patient not taking: Reported on 01/28/2016 12/12/15   Tatyana Kirichenko, PA-C  nitrofurantoin, macrocrystal-monohydrate, (MACROBID) 100 MG capsule Take 1 capsule (100 mg total) by mouth 2 (two) times daily. Patient not taking: Reported on 01/28/2016 12/12/15   Tatyana Kirichenko, PA-C  traMADol (ULTRAM) 50 MG tablet Take 1 tablet (50 mg total) by mouth every 6 (six) hours as needed. Patient not taking: Reported on 01/28/2016 12/12/15   Jaynie Crumble, PA-C    Allergies Sulfa antibiotics; Pseudoephedrine; Cephalosporins; Ciprocin-fluocin-procin [fluocinolone acetonide]; Ciprofloxacin hcl; Flagyl [metronidazole]; Hydrochlorothiazide; and Penicillins  Family History  Problem Relation Age of Onset  . Physical abuse Mother   . Anxiety disorder Mother   . ADD / ADHD Mother   . Paranoid behavior Mother   . Drug abuse Father   .  Depression Father   . Alcohol abuse Maternal Grandfather   . ADD / ADHD Maternal Grandmother   . Anxiety disorder Maternal Grandmother   . Dementia Maternal Grandmother   . OCD Maternal Grandmother   . Sexual abuse Maternal Grandmother   . Alcohol abuse Paternal Grandfather   . Bipolar disorder Cousin   . Schizophrenia Neg Hx   . Seizures Neg Hx     Social History Social History   Substance Use Topics  . Smoking status: Former Smoker    Packs/day: 0.25    Types: Cigarettes  . Smokeless tobacco: Never Used  . Alcohol use No    Review of Systems  Constitutional: Negative for fever. Cardiovascular: Negative for chest pain. Respiratory: Negative for shortness of breath. Gastrointestinal: Positive for abdominal pain. Positive for nausea and vomiting.  Genitourinary: Negative for dysuria. Musculoskeletal: Negative for back pain. Skin: Negative for rash. Neurological: Negative for headaches, focal weakness or numbness.  10-point ROS otherwise negative.  ____________________________________________   PHYSICAL EXAM:  VITAL SIGNS: ED Triage Vitals [03/23/16 1439]  Enc Vitals Group     BP      Pulse      Resp      Temp      Temp src      SpO2      Weight 215 lb (97.5 kg)     Height 5\' 6"  (1.676 m)     Head Circumference      Peak Flow      Pain Score 8   Constitutional: Alert and oriented. Well appearing and in no distress. Eyes: Conjunctivae are normal. Normal extraocular movements. ENT   Head: Normocephalic and atraumatic.   Nose: No congestion/rhinnorhea.   Mouth/Throat: Mucous membranes are moist.   Neck: No stridor. Hematological/Lymphatic/Immunilogical: No cervical lymphadenopathy. Cardiovascular: Normal rate, regular rhythm.  No murmurs, rubs, or gallops.  Respiratory: Normal respiratory effort without tachypnea nor retractions. Breath sounds are clear and equal bilaterally. No wheezes/rales/rhonchi. Gastrointestinal: Tender to palpation in the epigastric region. No rebound. No guarding. Soft.  Genitourinary: Deferred Musculoskeletal: Normal range of motion in all extremities. No lower extremity edema. Neurologic:  Normal speech and language. No gross focal neurologic deficits are appreciated.  Skin:  Skin is warm, dry and intact. No rash noted. Psychiatric: Mood and affect are normal. Speech and behavior are normal. Patient  exhibits appropriate insight and judgment.  ____________________________________________    LABS (pertinent positives/negatives)  Labs Reviewed  BASIC METABOLIC PANEL - Abnormal; Notable for the following:       Result Value   Glucose, Bld 132 (*)    All other components within normal limits  CBC - Abnormal; Notable for the following:    WBC 12.0 (*)    All other components within normal limits  URINALYSIS COMPLETEWITH MICROSCOPIC ([facility] ONLY) - Abnormal; Notable for the following:    Color, Urine YELLOW (*)    APPearance CLOUDY (*)    Leukocytes, UA 1+ (*)    Bacteria, UA RARE (*)    Squamous Epithelial / LPF 6-30 (*)    All other components within normal limits  HEPATIC FUNCTION PANEL - Abnormal; Notable for the following:    Total Bilirubin <0.1 (*)    Bilirubin, Direct <0.1 (*)    All other components within normal limits  TROPONIN I  LIPASE, BLOOD  POC URINE PREG, ED     ____________________________________________   EKG  I, Phineas SemenGraydon Aziza Stuckert, attending physician, personally viewed and interpreted this EKG  EKG Time: 1443  Rate: 104 Rhythm: sinus tachycardia Axis: normal Intervals: qtc 460 QRS: narrow ST changes: no st elevation Impression: normal ekg   ____________________________________________    RADIOLOGY  CXR IMPRESSION:  No acute disease.     I, Merlin Ege, personally viewed and evaluated these images (plain radiographs) as part of my medical decision making. ____________________________________________   PROCEDURES  Procedures  ____________________________________________   INITIAL IMPRESSION / ASSESSMENT AND PLAN / ED COURSE  Pertinent labs & imaging results that were available during my care of the patient were reviewed by me and considered in my medical decision making (see chart for details).  Patient presented to the emergency department today because of concerns for epigastric pain and nausea. Patient's workup was  consistent with biliary colic. No signs of acute cholecystitis. Patient only with a very mild leukocytosis. Patient was given fluids, pain and nausea medication here in the emergency department. Will discharge with surgery follow-up information. ____________________________________________   FINAL CLINICAL IMPRESSION(S) / ED DIAGNOSES  Final diagnoses:  Epigastric pain  Calculus of gallbladder without cholecystitis without obstruction     Note: This dictation was prepared with Dragon dictation. Any transcriptional errors that result from this process are unintentional    Phineas SemenGraydon Haylei Cobin, MD 03/23/16 2121

## 2016-03-23 NOTE — ED Notes (Signed)
Patient transported to Ultrasound 

## 2016-03-23 NOTE — ED Notes (Signed)
Pt developed rash after being given morphine, reports itching along arm, EDP notified and pt given 5mg  PO benadryl. Morphine added to allergy list

## 2016-03-23 NOTE — ED Triage Notes (Signed)
Pt reports centralized chest pain that started last night, reports radiates through to her back. Pt also reports persistent vomiting since last night.

## 2016-03-25 LAB — HEPATIC FUNCTION PANEL
ALBUMIN: 4.1 g/dL (ref 3.5–5.0)
ALT: 26 U/L (ref 14–54)
AST: 23 U/L (ref 15–41)
Alkaline Phosphatase: 48 U/L (ref 38–126)
BILIRUBIN TOTAL: 0.2 mg/dL — AB (ref 0.3–1.2)
Total Protein: 7.3 g/dL (ref 6.5–8.1)

## 2016-04-02 ENCOUNTER — Encounter (HOSPITAL_BASED_OUTPATIENT_CLINIC_OR_DEPARTMENT_OTHER): Payer: Self-pay | Admitting: *Deleted

## 2016-04-02 ENCOUNTER — Emergency Department (HOSPITAL_BASED_OUTPATIENT_CLINIC_OR_DEPARTMENT_OTHER)
Admission: EM | Admit: 2016-04-02 | Discharge: 2016-04-02 | Disposition: A | Discharge status: Home / Self Care | Payer: Self-pay | Attending: Emergency Medicine | Admitting: Emergency Medicine

## 2016-04-02 DIAGNOSIS — Z87891 Personal history of nicotine dependence: Secondary | ICD-10-CM | POA: Insufficient documentation

## 2016-04-02 DIAGNOSIS — I10 Essential (primary) hypertension: Secondary | ICD-10-CM | POA: Insufficient documentation

## 2016-04-02 DIAGNOSIS — J45909 Unspecified asthma, uncomplicated: Secondary | ICD-10-CM | POA: Insufficient documentation

## 2016-04-02 DIAGNOSIS — R112 Nausea with vomiting, unspecified: Secondary | ICD-10-CM

## 2016-04-02 DIAGNOSIS — K8020 Calculus of gallbladder without cholecystitis without obstruction: Secondary | ICD-10-CM | POA: Insufficient documentation

## 2016-04-02 LAB — URINALYSIS, ROUTINE W REFLEX MICROSCOPIC
Bilirubin Urine: NEGATIVE
GLUCOSE, UA: NEGATIVE mg/dL
Hgb urine dipstick: NEGATIVE
Ketones, ur: NEGATIVE mg/dL
Nitrite: NEGATIVE
PH: 6.5 (ref 5.0–8.0)
Protein, ur: NEGATIVE mg/dL
Specific Gravity, Urine: 1.028 (ref 1.005–1.030)

## 2016-04-02 LAB — COMPREHENSIVE METABOLIC PANEL
ALBUMIN: 3.9 g/dL (ref 3.5–5.0)
ALT: 22 U/L (ref 14–54)
AST: 18 U/L (ref 15–41)
Alkaline Phosphatase: 42 U/L (ref 38–126)
Anion gap: 7 (ref 5–15)
BILIRUBIN TOTAL: 0.3 mg/dL (ref 0.3–1.2)
BUN: 16 mg/dL (ref 6–20)
CHLORIDE: 108 mmol/L (ref 101–111)
CO2: 24 mmol/L (ref 22–32)
Calcium: 8.8 mg/dL — ABNORMAL LOW (ref 8.9–10.3)
Creatinine, Ser: 0.72 mg/dL (ref 0.44–1.00)
GFR calc Af Amer: >60 mL/min (ref >60)
GFR calc non Af Amer: >60 mL/min (ref >60)
GLUCOSE: 104 mg/dL — AB (ref 65–99)
POTASSIUM: 3.7 mmol/L (ref 3.5–5.1)
Sodium: 139 mmol/L (ref 135–145)
TOTAL PROTEIN: 6.8 g/dL (ref 6.5–8.1)

## 2016-04-02 LAB — CBC WITH DIFFERENTIAL/PLATELET
BASOS ABS: 0 10*3/uL (ref 0.0–0.1)
BASOS PCT: 0 %
Eosinophils Absolute: 0.2 10*3/uL (ref 0.0–0.7)
Eosinophils Relative: 2 %
HEMATOCRIT: 41.1 % (ref 36.0–46.0)
Hemoglobin: 14.1 g/dL (ref 12.0–15.0)
Lymphocytes Relative: 29 %
Lymphs Abs: 2.9 10*3/uL (ref 0.7–4.0)
MCH: 30.7 pg (ref 26.0–34.0)
MCHC: 34.3 g/dL (ref 30.0–36.0)
MCV: 89.3 fL (ref 78.0–100.0)
MONO ABS: 1.1 10*3/uL — AB (ref 0.1–1.0)
Monocytes Relative: 11 %
NEUTROS ABS: 5.8 10*3/uL (ref 1.7–7.7)
NEUTROS PCT: 58 %
Platelets: 307 10*3/uL (ref 150–400)
RBC: 4.6 MIL/uL (ref 3.87–5.11)
RDW: 12 % (ref 11.5–15.5)
WBC: 10.1 10*3/uL (ref 4.0–10.5)

## 2016-04-02 LAB — URINE MICROSCOPIC-ADD ON

## 2016-04-02 LAB — PREGNANCY, URINE: Preg Test, Ur: NEGATIVE

## 2016-04-02 LAB — LIPASE, BLOOD: Lipase: 21 U/L (ref 11–51)

## 2016-04-02 MED ORDER — HYDROCODONE-ACETAMINOPHEN 5-325 MG PO TABS: 1.0000 | ORAL_TABLET | Freq: Four times a day (QID) | ORAL | 0 refills | Status: DC | PRN

## 2016-04-02 MED ORDER — ONDANSETRON HCL 4 MG/2ML IJ SOLN
4.0000 mg | Freq: Once | INTRAMUSCULAR | Status: AC
  Administered 2016-04-02: 4 mg via INTRAVENOUS
  Filled 2016-04-02: qty 2

## 2016-04-02 MED ORDER — PROMETHAZINE HCL 25 MG PO TABS: 25.0000 mg | ORAL_TABLET | Freq: Four times a day (QID) | ORAL | 0 refills | Status: DC | PRN

## 2016-04-02 MED ORDER — SODIUM CHLORIDE 0.9 % IV BOLUS (SEPSIS)
1000.0000 mL | Freq: Once | INTRAVENOUS | Status: AC
  Administered 2016-04-02: 1000 mL via INTRAVENOUS

## 2016-04-02 MED ORDER — HYDROMORPHONE HCL 1 MG/ML IJ SOLN
1.0000 mg | Freq: Once | INTRAMUSCULAR | Status: AC
  Administered 2016-04-02: 1 mg via INTRAVENOUS
  Filled 2016-04-02: qty 1

## 2016-04-02 NOTE — ED Triage Notes (Signed)
C/o RUQ pain, onset 2100, seen at [facility] for same recently, "feels the same, but worse", rates 10/10, RUQ TTP, also nv & chills, radiates to R back, (denies: fever, diarrhea, dizziness, sob, bleeding, or urinary or vaginal sx), reported to have gallstones, h/o kidney stones, has tried tylenol at 2130, last emesis PTA, vomited x4 in last 24 hrs, last ate 1900, last BM yesterday morning, was suppose to be referred to a surgeon, and states, "but it wasn't on my paperwork, and my insurance doesn't kick in until 04/30/16".  Cough and nasal congestion noted, voce hoarse, LS CTA.

## 2016-04-02 NOTE — ED Provider Notes (Signed)
TIME SEEN: 5:55 AM  CHIEF COMPLAINT: Right upper quadrant pain, nausea and vomiting  HPI: Pt is a 30 y.o. female with history of hypertension, kidney stones, gallstones, migraines who presents emergency department with epigastric and right upper quadrant pain that started 2-3 hours after eating dinner. Reports she ate steak, potatoes and vegetables last night. Has had several episodes of nonbloody, nonbilious vomiting. No diarrhea. No fever. Pain radiates into her right upper back. Feels similar to her prior kidney stones. No dysuria, hematuria, vaginal bleeding or discharge. Last menstrual period was November 1. Through abdominal surgeries. States she has not followed up with a surgeon as an outpatient she does not have insurance until 04/30/2016.  ROS: See HPI Constitutional: no fever  Eyes: no drainage  ENT: no runny nose   Cardiovascular:  no chest pain  Resp: no SOB  GI:  vomiting GU: no dysuria Integumentary: no rash  Allergy: no hives  Musculoskeletal: no leg swelling  Neurological: no slurred speech ROS otherwise negative  PAST MEDICAL HISTORY/PAST SURGICAL HISTORY:  Past Medical History:  Diagnosis Date  . Anxiety   . Asthma   . Bronchitis   . Depression   . Drug-seeking behavior   . Fatty liver disease, nonalcoholic   . Gall stones   . Hypertension   . Increased frequency of headaches   . Irritable bowel   . Kidney stones   . Migraines   . Urinary tract infection     MEDICATIONS:  Prior to Admission medications   Medication Sig Start Date End Date Taking? Authorizing Provider  ALPRAZolam Prudy Feeler) 1 MG tablet Take 1 mg by mouth 3 (three) times daily.    Historical Provider, MD  buPROPion (WELLBUTRIN XL) 150 MG 24 hr tablet Take 150 mg by mouth at bedtime.    Historical Provider, MD  butalbital-acetaminophen-caffeine (FIORICET, ESGIC) [phone] MG tablet Take 1-2 tablets by mouth every 6 (six) hours as needed for headache. 01/28/16 01/27/17  Jene Every, MD   clindamycin (CLEOCIN) 150 MG capsule Take 2 capsules (300 mg total) by mouth 2 (two) times daily. May dispense as 150mg  capsules 06/25/15   Mady Gemma, PA-C  doxycycline (VIBRAMYCIN) 100 MG capsule Take 1 capsule (100 mg total) by mouth 2 (two) times daily. Patient not taking: Reported on 01/28/2016 12/12/15   Jaynie Crumble, PA-C  HYDROcodone-acetaminophen (NORCO) 5-325 MG tablet Take 1-2 tablets by mouth every 6 (six) hours as needed. 06/27/15   Geoffery Lyons, MD  ibuprofen (ADVIL,MOTRIN) 600 MG tablet Take 1 tablet (600 mg total) by mouth every 6 (six) hours as needed. Patient not taking: Reported on 01/28/2016 12/12/15   Tatyana Kirichenko, PA-C  nitrofurantoin, macrocrystal-monohydrate, (MACROBID) 100 MG capsule Take 1 capsule (100 mg total) by mouth 2 (two) times daily. Patient not taking: Reported on 01/28/2016 12/12/15   Tatyana Kirichenko, PA-C  ondansetron (ZOFRAN) 4 MG tablet Take 1 tablet (4 mg total) by mouth every 8 (eight) hours as needed for nausea or vomiting. 03/23/16   Phineas Semen, MD  traMADol (ULTRAM) 50 MG tablet Take 1 tablet (50 mg total) by mouth every 6 (six) hours as needed. Patient not taking: Reported on 01/28/2016 12/12/15   Tatyana Kirichenko, PA-C  traMADol (ULTRAM) 50 MG tablet Take 1 tablet (50 mg total) by mouth every 6 (six) hours as needed. 03/23/16 03/23/17  Phineas Semen, MD    ALLERGIES:  Allergies  Allergen Reactions  . Sulfa Antibiotics Hives and Shortness Of Breath  . Pseudoephedrine Palpitations and Other (See Comments)  sweats  . Cephalosporins     hives  . Ciprocin-Fluocin-Procin [Fluocinolone Acetonide] Hives  . Ciprofloxacin Hcl Hives  . Flagyl [Metronidazole] Other (See Comments)    Causes a greenish purple fuzz on tongue  . Hydrochlorothiazide     Sweaty, faint feeling  . Penicillins Hives    Has patient had a PCN reaction causing immediate rash, facial/tongue/throat swelling, SOB or lightheadedness with hypotension: Yes Has  patient had a PCN reaction causing severe rash involving mucus membranes or skin necrosis: No Has patient had a PCN reaction that required hospitalization Yes Has patient had a PCN reaction occurring within the last 10 years: No If all of the above answers are "NO", then may proceed with Cephalosporin use.  Marland Kitchen. Morphine And Related Rash    SOCIAL HISTORY:  Social History  Substance Use Topics  . Smoking status: Former Smoker    Packs/day: 0.25    Types: Cigarettes  . Smokeless tobacco: Never Used  . Alcohol use No    FAMILY HISTORY: Family History  Problem Relation Age of Onset  . Physical abuse Mother   . Anxiety disorder Mother   . ADD / ADHD Mother   . Paranoid behavior Mother   . Drug abuse Father   . Depression Father   . Alcohol abuse Maternal Grandfather   . ADD / ADHD Maternal Grandmother   . Anxiety disorder Maternal Grandmother   . Dementia Maternal Grandmother   . OCD Maternal Grandmother   . Sexual abuse Maternal Grandmother   . Alcohol abuse Paternal Grandfather   . Bipolar disorder Cousin   . Schizophrenia Neg Hx   . Seizures Neg Hx     EXAM: BP 143/87 (BP Location: Right Arm)   Pulse 103   Temp 97.4 F (36.3 C) (Oral)   Resp 20   Ht 5\' 6"  (1.676 m)   Wt 210 lb (95.3 kg)   LMP 02/29/2016 (Approximate)   SpO2 100%   BMI 33.89 kg/m  CONSTITUTIONAL: Alert and oriented and responds appropriately to questions. Well-appearing; well-nourished, Afebrile HEAD: Normocephalic EYES: Conjunctivae clear, PERRL, EOMI ENT: normal nose; no rhinorrhea; moist mucous membranes NECK: Supple, no meningismus, no nuchal rigidity, no LAD  CARD: Regular and minimally tachycardic; S1 and S2 appreciated; no murmurs, no clicks, no rubs, no gallops RESP: Normal chest excursion without splinting or tachypnea; breath sounds clear and equal bilaterally; no wheezes, no rhonchi, no rales, no hypoxia or respiratory distress, speaking full sentences ABD/GI: Normal bowel sounds;  non-distended; soft, Tender in epigastric region and right upper quadrant with positive Murphy sign, no rebound, no guarding, no peritoneal signs, no hepatosplenomegaly, no tenderness at McBurney's point BACK:  The back appears normal and is non-tender to palpation, there is no CVA tenderness EXT: Normal ROM in all joints; non-tender to palpation; no edema; normal capillary refill; no cyanosis, no calf tenderness or swelling    SKIN: Normal color for age and race; warm; no rash NEURO: Moves all extremities equally, sensation to light touch intact diffusely, cranial nerves II through XII intact, normal speech PSYCH: The patient's mood and manner are appropriate. Grooming and personal hygiene are appropriate.  MEDICAL DECISION MAKING: Patient here with right-sided abdominal pain radiating to her back feels similar to her prior gallstones but "more severe". She is afebrile and nontoxic appearing. No jaundice, scleral icterus. Will obtain labs, urine, give IV Dilaudid, Zofran and fluids. At this time I do not feel she needs repeat imaging last she develops a fever or her labs  are abnormal. She is comfortable with this plan.  ED PROGRESS: 6:40 AM  Labs showed no leukocytosis. Normal LFTs, normal lipase. Urine shows large leukocytes but also many bacteria and many squamous cells. Suspect dirty catch. This time she is not having urinary symptoms. Urine shows no ketones. Pregnancy test is negative.  Patient reports feeling much better after one dose of 1 mg of IV Dilaudid. Low suspicion for cholecystitis, choledocholithiasis given her labs, vitals. I do not feel this time she needs repeat imaging. I suspect this is biliary colic from gallstones. We'll discharge home with prescriptions for Vicodin, Zofran and give outpatient surgery follow-up. Have recommended very low fat bland diet. Discussed with her return precautions. She verbalized understanding and is comfortable with this plan.   At this time, I do not  feel there is any life-threatening condition present. I have reviewed and discussed all results (EKG, imaging, lab, urine as appropriate) and exam findings with patient/family. I have reviewed nursing notes and appropriate previous records.  I feel the patient is safe to be discharged home without further emergent workup and can continue workup as an outpatient as needed. Discussed usual and customary return precautions. Patient/family verbalize understanding and are comfortable with this plan.  Outpatient follow-up has been provided. All questions have been answered.     Layla MawKristen N Ward, DO 04/02/16 [phone]

## 2016-04-02 NOTE — ED Notes (Addendum)
Dr. Elesa MassedWard in to see and update pt on results and d/c plan. No emesis in ED. "feel better", NAD, calm.

## 2016-04-05 ENCOUNTER — Emergency Department (HOSPITAL_BASED_OUTPATIENT_CLINIC_OR_DEPARTMENT_OTHER)
Admission: EM | Admit: 2016-04-05 | Discharge: 2016-04-05 | Discharge status: Against Medical Advice | Payer: Self-pay | Attending: Emergency Medicine | Admitting: Emergency Medicine

## 2016-04-05 ENCOUNTER — Encounter (HOSPITAL_BASED_OUTPATIENT_CLINIC_OR_DEPARTMENT_OTHER): Payer: Self-pay | Admitting: Emergency Medicine

## 2016-04-05 DIAGNOSIS — R197 Diarrhea, unspecified: Secondary | ICD-10-CM | POA: Insufficient documentation

## 2016-04-05 DIAGNOSIS — J45909 Unspecified asthma, uncomplicated: Secondary | ICD-10-CM | POA: Insufficient documentation

## 2016-04-05 DIAGNOSIS — Z7289 Other problems related to lifestyle: Secondary | ICD-10-CM | POA: Insufficient documentation

## 2016-04-05 DIAGNOSIS — R112 Nausea with vomiting, unspecified: Secondary | ICD-10-CM | POA: Insufficient documentation

## 2016-04-05 DIAGNOSIS — Z765 Malingerer [conscious simulation]: Secondary | ICD-10-CM

## 2016-04-05 DIAGNOSIS — I10 Essential (primary) hypertension: Secondary | ICD-10-CM | POA: Insufficient documentation

## 2016-04-05 DIAGNOSIS — Z87891 Personal history of nicotine dependence: Secondary | ICD-10-CM | POA: Insufficient documentation

## 2016-04-05 LAB — COMPREHENSIVE METABOLIC PANEL
ALT: 22 U/L (ref 14–54)
AST: 19 U/L (ref 15–41)
Albumin: 3.9 g/dL (ref 3.5–5.0)
Alkaline Phosphatase: 39 U/L (ref 38–126)
Anion gap: 8 (ref 5–15)
BUN: 16 mg/dL (ref 6–20)
CHLORIDE: 106 mmol/L (ref 101–111)
CO2: 24 mmol/L (ref 22–32)
CREATININE: 0.71 mg/dL (ref 0.44–1.00)
Calcium: 8.8 mg/dL — ABNORMAL LOW (ref 8.9–10.3)
Glucose, Bld: 97 mg/dL (ref 65–99)
POTASSIUM: 3.7 mmol/L (ref 3.5–5.1)
Sodium: 138 mmol/L (ref 135–145)
Total Bilirubin: 0.3 mg/dL (ref 0.3–1.2)
Total Protein: 7 g/dL (ref 6.5–8.1)

## 2016-04-05 LAB — CBC WITH DIFFERENTIAL/PLATELET
Basophils Absolute: 0 10*3/uL (ref 0.0–0.1)
Basophils Relative: 0 %
EOS PCT: 2 %
Eosinophils Absolute: 0.2 10*3/uL (ref 0.0–0.7)
HCT: 40.4 % (ref 36.0–46.0)
Hemoglobin: 13.7 g/dL (ref 12.0–15.0)
LYMPHS ABS: 3.8 10*3/uL (ref 0.7–4.0)
LYMPHS PCT: 33 %
MCH: 30.3 pg (ref 26.0–34.0)
MCHC: 33.9 g/dL (ref 30.0–36.0)
MCV: 89.4 fL (ref 78.0–100.0)
MONO ABS: 1.4 10*3/uL — AB (ref 0.1–1.0)
Monocytes Relative: 12 %
Neutro Abs: 6.3 10*3/uL (ref 1.7–7.7)
Neutrophils Relative %: 53 %
PLATELETS: 310 10*3/uL (ref 150–400)
RBC: 4.52 MIL/uL (ref 3.87–5.11)
RDW: 12 % (ref 11.5–15.5)
WBC: 11.7 10*3/uL — ABNORMAL HIGH (ref 4.0–10.5)

## 2016-04-05 LAB — LIPASE, BLOOD: LIPASE: 22 U/L (ref 11–51)

## 2016-04-05 MED ORDER — KETOROLAC TROMETHAMINE 30 MG/ML IJ SOLN
30.0000 mg | Freq: Once | INTRAMUSCULAR | Status: AC
  Administered 2016-04-05: 30 mg via INTRAVENOUS
  Filled 2016-04-05: qty 1

## 2016-04-05 MED ORDER — METOCLOPRAMIDE HCL 5 MG/ML IJ SOLN
10.0000 mg | Freq: Once | INTRAMUSCULAR | Status: AC
  Administered 2016-04-05: 10 mg via INTRAVENOUS
  Filled 2016-04-05: qty 2

## 2016-04-05 MED ORDER — SODIUM CHLORIDE 0.9 % IV BOLUS (SEPSIS)
1000.0000 mL | Freq: Once | INTRAVENOUS | Status: AC
  Administered 2016-04-05: 1000 mL via INTRAVENOUS

## 2016-04-05 NOTE — ED Provider Notes (Signed)
[facility] Provider Note   CSN: [id number] Arrival date & time: 04/05/16  0122     History   Chief Complaint Chief Complaint  Patient presents with  . Abdominal Pain    HPI Carol [name] is a 30 y.o. female.  Patient presents to the emergency department for evaluation of upper abdominal pain radiating into her back. Patient has been seen several times for this. She has been documented as having gallstones at [facility]. Patient reports that she started having pain in her back around 7 PM and then it became more focal in the right upper quadrant. This was followed by acute nausea, vomiting and diarrhea. Pain is constant and severe.      Past Medical History:  Diagnosis Date  . Anxiety   . Asthma   . Bronchitis   . Depression   . Drug-seeking behavior   . Fatty liver disease, nonalcoholic   . Gall stones   . Gall stones   . Hypertension   . Increased frequency of headaches   . Irritable bowel   . Kidney stones   . Migraines   . Urinary tract infection     Patient Active Problem List   Diagnosis Date Noted  . MDD (major depressive disorder), recurrent severe, without psychosis (HCC) 11/01/2013  . 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 Borderline behavior 05/08/2012  . ADHD (attention deficit hyperactivity disorder) 05/08/2012  . Insomnia secondary to depression with anxiety 05/08/2012    Past Surgical History:  Procedure Laterality Date  . NO PAST SURGERIES      OB History    Gravida Para Term Preterm AB Living   0 0 0 0 0 0   SAB TAB Ectopic Multiple Live Births   0 0 0 0         Home Medications    Prior to Admission medications   Medication Sig Start Date End Date Taking? Authorizing Provider  ALPRAZolam Prudy Feeler(XANAX) 1 MG tablet Take 1 mg by mouth 3 (three) times daily.    Historical Provider, MD  buPROPion (WELLBUTRIN XL) 150 MG 24 hr tablet Take 150 mg by mouth at bedtime.    Historical Provider, MD  butalbital-acetaminophen-caffeine  (FIORICET, ESGIC) [phone] MG tablet Take 1-2 tablets by mouth every 6 (six) hours as needed for headache. 01/28/16 01/27/17  Jene Everyobert Kinner, MD  HYDROcodone-acetaminophen (NORCO/VICODIN) 5-325 MG tablet Take 1-2 tablets by mouth every 6 (six) hours as needed. 04/02/16   Kristen N Ward, DO  promethazine (PHENERGAN) 25 MG tablet Take 1 tablet (25 mg total) by mouth every 6 (six) hours as needed for nausea or vomiting. 04/02/16   Layla MawKristen N Ward, DO    Family History Family History  Problem Relation Age of Onset  . Physical abuse Mother   . Anxiety disorder Mother   . ADD / ADHD Mother   . Paranoid behavior Mother   . Drug abuse Father   . Depression Father   . Alcohol abuse Maternal Grandfather   . ADD / ADHD Maternal Grandmother   . Anxiety disorder Maternal Grandmother   . Dementia Maternal Grandmother   . OCD Maternal Grandmother   . Sexual abuse Maternal Grandmother   . Alcohol abuse Paternal Grandfather   . Bipolar disorder Cousin   . Schizophrenia Neg Hx   . Seizures Neg Hx     Social History Social History  Substance Use Topics  . Smoking status: Former Smoker    Packs/day: 0.25    Types: Cigarettes  .  Smokeless tobacco: Never Used  . Alcohol use No     Allergies   Sulfa antibiotics; Pseudoephedrine; Cephalosporins; Ciprocin-fluocin-procin [fluocinolone acetonide]; Ciprofloxacin hcl; Flagyl [metronidazole]; Hydrochlorothiazide; Penicillins; and Morphine and related   Review of Systems Review of Systems  Gastrointestinal: Positive for abdominal pain, diarrhea, nausea and vomiting.  All other systems reviewed and are negative.    Physical Exam Updated Vital Signs BP (!) 155/106   Pulse 90   Temp 97.7 F (36.5 C) (Oral)   Resp 20   LMP 02/29/2016 (Approximate)   SpO2 97%   Physical Exam  Constitutional: She is oriented to person, place, and time. She appears well-developed and well-nourished. No distress.  HENT:  Head: Normocephalic and atraumatic.  Right  Ear: Hearing normal.  Left Ear: Hearing normal.  Nose: Nose normal.  Mouth/Throat: Oropharynx is clear and moist and mucous membranes are normal.  Eyes: Conjunctivae and EOM are normal. Pupils are equal, round, and reactive to light.  Neck: Normal range of motion. Neck supple.  Cardiovascular: Regular rhythm, S1 normal and S2 normal.  Exam reveals no gallop and no friction rub.   No murmur heard. Pulmonary/Chest: Effort normal and breath sounds normal. No respiratory distress. She exhibits no tenderness.  Abdominal: Soft. Normal appearance and bowel sounds are normal. There is no hepatosplenomegaly. There is tenderness in the right upper quadrant. There is no rebound, no guarding, no tenderness at McBurney's point and negative Murphy's sign. No hernia.  Musculoskeletal: Normal range of motion.  Neurological: She is alert and oriented to person, place, and time. She has normal strength. No cranial nerve deficit or sensory deficit. Coordination normal. GCS eye subscore is 4. GCS verbal subscore is 5. GCS motor subscore is 6.  Skin: Skin is warm, dry and intact. No rash noted. No cyanosis.  Psychiatric: She has a normal mood and affect. Her speech is normal and behavior is normal. Thought content normal.  Nursing note and vitals reviewed.    ED Treatments / Results  Labs (all labs ordered are listed, but only abnormal results are displayed) Labs Reviewed  CBC WITH DIFFERENTIAL/PLATELET - Abnormal; Notable for the following:       Result Value   WBC 11.7 (*)    Monocytes Absolute 1.4 (*)    All other components within normal limits  COMPREHENSIVE METABOLIC PANEL  LIPASE, BLOOD    EKG  EKG Interpretation None       Radiology No results found.  Procedures Procedures (including critical care time)  Medications Ordered in ED Medications  sodium chloride 0.9 % bolus 1,000 mL (1,000 mLs Intravenous New Bag/Given 04/05/16 0213)  metoCLOPramide (REGLAN) injection 10 mg (10 mg  Intravenous Given 04/05/16 0213)  ketorolac (TORADOL) 30 MG/ML injection 30 mg (30 mg Intravenous Given 04/05/16 0214)     Initial Impression / Assessment and Plan / ED Course  I have reviewed the triage vital signs and the nursing notes.  Pertinent labs & imaging results that were available during my care of the patient were reviewed by me and considered in my medical decision making (see chart for details).  Clinical Course   Patient presents with complaints of abdominal pain. She has been seen in the ER multiple times for this. She has been documented as having gallstones by ultrasound. Examination revealed tenderness in the right upper quadrant without Murphy sign. Vital signs are stable.  Lab work was ordered to evaluate LFTs and patient was initially to be treated with Toradol. When she learned that she was  not getting Dilaudid, she pulled her IV out and the left the emergency department. This is concerning for drug-seeking behavior.  Final Clinical Impressions(s) / ED Diagnoses   Final diagnoses:  Drug-seeking behavior    New Prescriptions New Prescriptions   No medications on file     Gilda Crease, MD 04/05/16 [phone]

## 2016-04-05 NOTE — ED Notes (Signed)
Registration alerted nursing staff that pt was walking out. Sam RN stopped pt to assure pt did not have IV in arm. Pt had removed own IV. Pt states "I was getting jittery back there." pt ambulatory out of department without difficulty.

## 2016-04-05 NOTE — ED Triage Notes (Signed)
Pt c./o abd pain with n/v/d starting about 1900. Pt was seen here a few days ago and dx with gall stones.

## 2016-07-10 ENCOUNTER — Encounter: Payer: Self-pay | Admitting: Emergency Medicine

## 2016-07-10 ENCOUNTER — Emergency Department
Admission: EM | Admit: 2016-07-10 | Discharge: 2016-07-10 | Disposition: A | Discharge status: Home / Self Care | Payer: Self-pay | Attending: Emergency Medicine | Admitting: Emergency Medicine

## 2016-07-10 ENCOUNTER — Emergency Department: Payer: Self-pay

## 2016-07-10 DIAGNOSIS — Z87891 Personal history of nicotine dependence: Secondary | ICD-10-CM | POA: Insufficient documentation

## 2016-07-10 DIAGNOSIS — J45909 Unspecified asthma, uncomplicated: Secondary | ICD-10-CM | POA: Insufficient documentation

## 2016-07-10 DIAGNOSIS — F909 Attention-deficit hyperactivity disorder, unspecified type: Secondary | ICD-10-CM | POA: Insufficient documentation

## 2016-07-10 DIAGNOSIS — R109 Unspecified abdominal pain: Secondary | ICD-10-CM

## 2016-07-10 DIAGNOSIS — N200 Calculus of kidney: Secondary | ICD-10-CM | POA: Insufficient documentation

## 2016-07-10 DIAGNOSIS — Z79899 Other long term (current) drug therapy: Secondary | ICD-10-CM | POA: Insufficient documentation

## 2016-07-10 DIAGNOSIS — I10 Essential (primary) hypertension: Secondary | ICD-10-CM | POA: Insufficient documentation

## 2016-07-10 LAB — BASIC METABOLIC PANEL
ANION GAP: 6 (ref 5–15)
BUN: 15 mg/dL (ref 6–20)
CALCIUM: 8.8 mg/dL — AB (ref 8.9–10.3)
CO2: 25 mmol/L (ref 22–32)
Chloride: 106 mmol/L (ref 101–111)
Creatinine, Ser: 0.83 mg/dL (ref 0.44–1.00)
Glucose, Bld: 94 mg/dL (ref 65–99)
POTASSIUM: 3.8 mmol/L (ref 3.5–5.1)
SODIUM: 137 mmol/L (ref 135–145)

## 2016-07-10 LAB — HEPATIC FUNCTION PANEL
ALBUMIN: 4.1 g/dL (ref 3.5–5.0)
ALT: 21 U/L (ref 14–54)
AST: 19 U/L (ref 15–41)
Alkaline Phosphatase: 43 U/L (ref 38–126)
BILIRUBIN DIRECT: 0.1 mg/dL (ref 0.1–0.5)
BILIRUBIN INDIRECT: 0.2 mg/dL — AB (ref 0.3–0.9)
Total Bilirubin: 0.3 mg/dL (ref 0.3–1.2)
Total Protein: 7.6 g/dL (ref 6.5–8.1)

## 2016-07-10 LAB — CBC WITH DIFFERENTIAL/PLATELET
BASOS ABS: 0.1 10*3/uL (ref 0–0.1)
BASOS PCT: 1 %
EOS ABS: 0.4 10*3/uL (ref 0–0.7)
Eosinophils Relative: 4 %
HCT: 42.5 % (ref 35.0–47.0)
HEMOGLOBIN: 15.1 g/dL (ref 12.0–16.0)
Lymphocytes Relative: 27 %
Lymphs Abs: 2.9 10*3/uL (ref 1.0–3.6)
MCH: 30.8 pg (ref 26.0–34.0)
MCHC: 35.6 g/dL (ref 32.0–36.0)
MCV: 86.6 fL (ref 80.0–100.0)
Monocytes Absolute: 1.1 10*3/uL — ABNORMAL HIGH (ref 0.2–0.9)
Monocytes Relative: 11 %
NEUTROS ABS: 6.3 10*3/uL (ref 1.4–6.5)
NEUTROS PCT: 57 %
Platelets: 309 10*3/uL (ref 150–440)
RBC: 4.91 MIL/uL (ref 3.80–5.20)
RDW: 12.4 % (ref 11.5–14.5)
WBC: 10.8 10*3/uL (ref 3.6–11.0)

## 2016-07-10 LAB — URINALYSIS, COMPLETE (UACMP) WITH MICROSCOPIC
BILIRUBIN URINE: NEGATIVE
GLUCOSE, UA: NEGATIVE mg/dL
HGB URINE DIPSTICK: NEGATIVE
Ketones, ur: NEGATIVE mg/dL
NITRITE: NEGATIVE
PROTEIN: NEGATIVE mg/dL
SPECIFIC GRAVITY, URINE: 1.024 (ref 1.005–1.030)
pH: 5 (ref 5.0–8.0)

## 2016-07-10 LAB — LIPASE, BLOOD: LIPASE: 23 U/L (ref 11–51)

## 2016-07-10 LAB — HCG, QUANTITATIVE, PREGNANCY: hCG, Beta Chain, Quant, S: <1 m[IU]/mL (ref <5)

## 2016-07-10 LAB — POCT PREGNANCY, URINE: Preg Test, Ur: NEGATIVE

## 2016-07-10 MED ORDER — PROCHLORPERAZINE EDISYLATE 5 MG/ML IJ SOLN
10.0000 mg | Freq: Once | INTRAMUSCULAR | Status: AC
  Administered 2016-07-10: 10 mg via INTRAVENOUS
  Filled 2016-07-10: qty 2

## 2016-07-10 MED ORDER — IBUPROFEN 600 MG PO TABS: 600.0000 mg | ORAL_TABLET | Freq: Three times a day (TID) | ORAL | 0 refills | Status: DC | PRN

## 2016-07-10 MED ORDER — DIPHENHYDRAMINE HCL 50 MG/ML IJ SOLN
INTRAMUSCULAR | Status: AC
  Administered 2016-07-10: 50 mg via INTRAVENOUS
  Filled 2016-07-10: qty 1

## 2016-07-10 MED ORDER — SODIUM CHLORIDE 0.9 % IV BOLUS (SEPSIS)
1000.0000 mL | Freq: Once | INTRAVENOUS | Status: AC
  Administered 2016-07-10: 1000 mL via INTRAVENOUS

## 2016-07-10 MED ORDER — LIDOCAINE HCL (CARDIAC) 20 MG/ML IV SOLN
150.0000 mg | Freq: Once | INTRAVENOUS | Status: DC
  Filled 2016-07-10: qty 10

## 2016-07-10 MED ORDER — ACETAMINOPHEN 500 MG PO TABS: 1000.0000 mg | ORAL_TABLET | Freq: Once | ORAL | Status: DC

## 2016-07-10 MED ORDER — DIPHENHYDRAMINE HCL 50 MG/ML IJ SOLN
50.0000 mg | Freq: Once | INTRAMUSCULAR | Status: AC
  Administered 2016-07-10: 50 mg via INTRAVENOUS

## 2016-07-10 MED ORDER — KETOROLAC TROMETHAMINE 30 MG/ML IJ SOLN
15.0000 mg | Freq: Once | INTRAMUSCULAR | Status: AC
  Administered 2016-07-10: 15 mg via INTRAVENOUS
  Filled 2016-07-10: qty 1

## 2016-07-10 NOTE — ED Triage Notes (Signed)
States passing kidney stones since yesterday.  Today c/o right lower back pain (x2 days), nausea, dry mouth.

## 2016-07-10 NOTE — ED Notes (Signed)
Pt called out feeling anxious post compazine. Dr Lamont Snowballrifenbark notified. See orders.

## 2016-07-10 NOTE — ED Notes (Signed)
Gave pt a blanket

## 2016-07-10 NOTE — ED Provider Notes (Signed)
[facility] Emergency Department Provider Note  ____________________________________________   First MD Initiated Contact with Patient 07/10/16 1016     (approximate)  I have reviewed the triage vital signs and the nursing notes.   HISTORY  Chief Complaint Flank Pain   HPI Carol [name] is a 31 y.o. female who comes to the emergency department with moderate to severe right low back pain radiating to right choroid the last 2 days. Yesterday she felt like she passed a kidney stone and today in the shower she felt like she passed another one. She comes to the emergency department with a small rock in a plastic bag. She is taking no medications at home. Pain is associated with nausea. Not associated with movement. Not associated with food. She has no past medical history and takes Abdominal surgical history. She denies chest pain or shortness of breath. She denies dysuria frequency or hesitancy. She denies hematuria.  Past Medical History:  Diagnosis Date  . Anxiety   . Asthma   . Bronchitis   . Depression   . Drug-seeking behavior   . Fatty liver disease, nonalcoholic   . Gall stones   . Gall stones   . Hypertension   . Increased frequency of headaches   . Irritable bowel   . Kidney stones   . Migraines   . Urinary tract infection     Patient Active Problem List   Diagnosis Date Noted  . MDD (major depressive disorder), recurrent severe, without psychosis (HCC) 11/01/2013  . 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 Borderline behavior 05/08/2012  . ADHD (attention deficit hyperactivity disorder) 05/08/2012  . Insomnia secondary to depression with anxiety 05/08/2012    Past Surgical History:  Procedure Laterality Date  . NO PAST SURGERIES      Prior to Admission medications   Medication Sig Start Date End Date Taking? Authorizing Provider  ALPRAZolam Prudy Feeler(XANAX) 1 MG tablet Take 1 mg by mouth 3 (three) times daily.    Historical Provider, MD  buPROPion  (WELLBUTRIN XL) 150 MG 24 hr tablet Take 150 mg by mouth at bedtime.    Historical Provider, MD  butalbital-acetaminophen-caffeine (FIORICET, ESGIC) [phone] MG tablet Take 1-2 tablets by mouth every 6 (six) hours as needed for headache. 01/28/16 01/27/17  Jene Everyobert Kinner, MD  HYDROcodone-acetaminophen (NORCO/VICODIN) 5-325 MG tablet Take 1-2 tablets by mouth every 6 (six) hours as needed. 04/02/16   Kristen N Ward, DO  ibuprofen (ADVIL,MOTRIN) 600 MG tablet Take 1 tablet (600 mg total) by mouth every 8 (eight) hours as needed. 07/10/16   Merrily BrittleNeil Hasini Peachey, MD  promethazine (PHENERGAN) 25 MG tablet Take 1 tablet (25 mg total) by mouth every 6 (six) hours as needed for nausea or vomiting. 04/02/16   Kristen N Ward, DO    Allergies Sulfa antibiotics; Pseudoephedrine; Cephalosporins; Ciprocin-fluocin-procin [fluocinolone acetonide]; Ciprofloxacin hcl; Flagyl [metronidazole]; Hydrochlorothiazide; Penicillins; and Morphine and related  Family History  Problem Relation Age of Onset  . Physical abuse Mother   . Anxiety disorder Mother   . ADD / ADHD Mother   . Paranoid behavior Mother   . Drug abuse Father   . Depression Father   . Alcohol abuse Maternal Grandfather   . ADD / ADHD Maternal Grandmother   . Anxiety disorder Maternal Grandmother   . Dementia Maternal Grandmother   . OCD Maternal Grandmother   . Sexual abuse Maternal Grandmother   . Alcohol abuse Paternal Grandfather   . Bipolar disorder Cousin   . Schizophrenia Neg Hx   .  Seizures Neg Hx     Social History Social History  Substance Use Topics  . Smoking status: Former Smoker    Packs/day: 0.25    Types: Cigarettes  . Smokeless tobacco: Never Used  . Alcohol use No    Review of Systems Constitutional: No fever/chills Eyes: No visual changes. ENT: No sore throat. Cardiovascular: Denies chest pain. Respiratory: Denies shortness of breath. Gastrointestinal: Positive abdominal pain.  Positive nausea, no vomiting.  No diarrhea.   No constipation. Genitourinary: Negative for dysuria. Musculoskeletal: Negative for back pain. Skin: Negative for rash. Neurological: Negative for headaches, focal weakness or numbness.  10-point ROS otherwise negative.  ____________________________________________   PHYSICAL EXAM:  VITAL SIGNS: ED Triage Vitals  Enc Vitals Group     BP 07/10/16 1002 131/87     Pulse Rate 07/10/16 1002 (!) 104     Resp 07/10/16 1002 16     Temp 07/10/16 1002 98.1 F (36.7 C)     Temp Source 07/10/16 1002 Oral     SpO2 07/10/16 1002 96 %     Weight 07/10/16 1002 220 lb (99.8 kg)     Height 07/10/16 1002 5\' 5"  (1.651 m)     Head Circumference --      Peak Flow --      Pain Score 07/10/16 1008 10     Pain Loc --      Pain Edu? --      Excl. in GC? --     Constitutional: Alert and oriented x 4 well appearing nontoxic no diaphoresis speaks in full, clear sentences Eyes: PERRL EOMI. Head: Atraumatic. Nose: No congestion/rhinnorhea. Mouth/Throat: No trismus Neck: No stridor.   Cardiovascular: Normal rate, regular rhythm. Grossly normal heart sounds.  Good peripheral circulation. Respiratory: Normal respiratory effort.  No retractions. Lungs CTAB and moving good air Gastrointestinal: Soft nondistended nontender no rebound no guarding no peritonitis no McBurney's tenderness negative Rovsing's no costovertebral tenderness negative Murphy's Musculoskeletal: No lower extremity edema   Neurologic:  Normal speech and language. No gross focal neurologic deficits are appreciated. Skin:  Skin is warm, dry and intact. No rash noted. Psychiatric: Mood and affect are normal. Speech and behavior are normal.    ____________________________________________   DIFFERENTIAL  Kidney stone, pyelonephritis, gallstones, acute coronary syndrome ____________________________________________   LABS (all labs ordered are listed, but only abnormal results are displayed)  Labs Reviewed  HEPATIC FUNCTION PANEL  - Abnormal; Notable for the following:       Result Value   Indirect Bilirubin 0.2 (*)    All other components within normal limits  BASIC METABOLIC PANEL - Abnormal; Notable for the following:    Calcium 8.8 (*)    All other components within normal limits  CBC WITH DIFFERENTIAL/PLATELET - Abnormal; Notable for the following:    Monocytes Absolute 1.1 (*)    All other components within normal limits  URINALYSIS, COMPLETE (UACMP) WITH MICROSCOPIC - Abnormal; Notable for the following:    Color, Urine YELLOW (*)    APPearance CLOUDY (*)    Leukocytes, UA MODERATE (*)    Bacteria, UA RARE (*)    Squamous Epithelial / LPF 6-30 (*)    All other components within normal limits  LIPASE, BLOOD  HCG, QUANTITATIVE, PREGNANCY  POCT PREGNANCY, URINE    Urinalysis negative for infection __________________________________________  EKG   ____________________________________________  RADIOLOGY  CT only showed small stone in the left kidney but none in the ureter ____________________________________________   PROCEDURES  Procedure(s) performed: no  Procedures  Critical Care performed: no  ____________________________________________   INITIAL IMPRESSION / ASSESSMENT AND PLAN / ED COURSE  Pertinent labs & imaging results that were available during my care of the patient were reviewed by me and considered in my medical decision making (see chart for details).  On arrival the patient is well-appearing and comes holding what appears to be a kidney stone in a bag. She has passed 2 in the last 2 days she may have more and so she warrants CT scan today to evaluate.  Fortunately the patient's urinalysis is negative for infection and she has no obstructive stone. At this point her pain is improved and she is medically stable for outpatient management. She understands and agrees the plan.     ----------------------------------------- 12:20 PM on  07/10/2016 -----------------------------------------  The patient's pain is improved. She is able to eat and drink. Her CT scan is negative for acute pathology and her labs are unremarkable. The skin does show 1 stone remaining in her kidney but it would not be symptomatic. This does reflect however she likely did have a stone that passed. At this point she is medically stable for outpatient management understands and agrees with the plan. ____________________________________________   FINAL CLINICAL IMPRESSION(S) / ED DIAGNOSES  Final diagnoses:  Kidney stone  Abdominal pain, unspecified abdominal location      NEW MEDICATIONS STARTED DURING THIS VISIT:  New Prescriptions   IBUPROFEN (ADVIL,MOTRIN) 600 MG TABLET    Take 1 tablet (600 mg total) by mouth every 8 (eight) hours as needed.     Note:  This document was prepared using Dragon voice recognition software and may include unintentional dictation errors.     Merrily Brittle, MD 07/10/16 1242

## 2016-07-10 NOTE — ED Notes (Signed)
RN called lab to determine delay in hCG results. CT informed lab is being run at this time.

## 2016-07-10 NOTE — ED Notes (Signed)
CT informed of Preg results

## 2016-07-10 NOTE — Discharge Instructions (Signed)
Normal course of abdominal pain with a passed kidney stone is to feel discomfort up to 24 hours. If your pain persists longer than 24 hours from now please return, or return sooner if your pain worsens instead of continues to improve. Otherwise follow up with your primary care physician as needed.  It was a pleasure to take care of you today, and thank you for coming to our emergency department.  If you have any questions or concerns before leaving please ask the nurse to grab me and I'm more than happy to go through your aftercare instructions again.  If you were prescribed any opioid pain medication today such as Norco, Vicodin, Percocet, morphine, hydrocodone, or oxycodone please make sure you do not drive when you are taking this medication as it can alter your ability to drive safely.  If you have any concerns once you are home that you are not improving or are in fact getting worse before you can make it to your follow-up appointment, please do not hesitate to call 911 and come back for further evaluation.  Merrily BrittleNeil Myya Meenach MD  Results for orders placed or performed during the hospital encounter of 07/10/16  Hepatic function panel  Result Value Ref Range   Total Protein 7.6 6.5 - 8.1 g/dL   Albumin 4.1 3.5 - 5.0 g/dL   AST 19 15 - 41 U/L   ALT 21 14 - 54 U/L   Alkaline Phosphatase 43 38 - 126 U/L   Total Bilirubin 0.3 0.3 - 1.2 mg/dL   Bilirubin, Direct 0.1 0.1 - 0.5 mg/dL   Indirect Bilirubin 0.2 (L) 0.3 - 0.9 mg/dL  Basic metabolic panel  Result Value Ref Range   Sodium 137 135 - 145 mmol/L   Potassium 3.8 3.5 - 5.1 mmol/L   Chloride 106 101 - 111 mmol/L   CO2 25 22 - 32 mmol/L   Glucose, Bld 94 65 - 99 mg/dL   BUN 15 6 - 20 mg/dL   Creatinine, Ser 1.610.83 0.44 - 1.00 mg/dL   Calcium 8.8 (L) 8.9 - 10.3 mg/dL   GFR calc non Af Amer >60 >60 mL/min   GFR calc Af Amer >60 >60 mL/min   Anion gap 6 5 - 15  Lipase, blood  Result Value Ref Range   Lipase 23 11 - 51 U/L  CBC with  Differential  Result Value Ref Range   WBC 10.8 3.6 - 11.0 K/uL   RBC 4.91 3.80 - 5.20 MIL/uL   Hemoglobin 15.1 12.0 - 16.0 g/dL   HCT 09.642.5 04.535.0 - 40.947.0 %   MCV 86.6 80.0 - 100.0 fL   MCH 30.8 26.0 - 34.0 pg   MCHC 35.6 32.0 - 36.0 g/dL   RDW 81.112.4 91.411.5 - 78.214.5 %   Platelets 309 150 - 440 K/uL   Neutrophils Relative % 57 %   Neutro Abs 6.3 1.4 - 6.5 K/uL   Lymphocytes Relative 27 %   Lymphs Abs 2.9 1.0 - 3.6 K/uL   Monocytes Relative 11 %   Monocytes Absolute 1.1 (H) 0.2 - 0.9 K/uL   Eosinophils Relative 4 %   Eosinophils Absolute 0.4 0 - 0.7 K/uL   Basophils Relative 1 %   Basophils Absolute 0.1 0 - 0.1 K/uL  Urinalysis, Complete w Microscopic  Result Value Ref Range   Color, Urine YELLOW (A) YELLOW   APPearance CLOUDY (A) CLEAR   Specific Gravity, Urine 1.024 1.005 - 1.030   pH 5.0 5.0 - 8.0  Glucose, UA NEGATIVE NEGATIVE mg/dL   Hgb urine dipstick NEGATIVE NEGATIVE   Bilirubin Urine NEGATIVE NEGATIVE   Ketones, ur NEGATIVE NEGATIVE mg/dL   Protein, ur NEGATIVE NEGATIVE mg/dL   Nitrite NEGATIVE NEGATIVE   Leukocytes, UA MODERATE (A) NEGATIVE   RBC / HPF 0-5 0 - 5 RBC/hpf   WBC, UA 0-5 0 - 5 WBC/hpf   Bacteria, UA RARE (A) NONE SEEN   Squamous Epithelial / LPF 6-30 (A) NONE SEEN   Mucous PRESENT   hCG, quantitative, pregnancy  Result Value Ref Range   hCG, Beta Chain, Quant, S <1 <5 mIU/mL  Pregnancy, urine POC  Result Value Ref Range   Preg Test, Ur NEGATIVE NEGATIVE   Ct Renal Stone Study  Result Date: 07/10/2016 CLINICAL DATA:  Right flank pain, hematuria EXAM: CT ABDOMEN AND PELVIS WITHOUT CONTRAST TECHNIQUE: Multidetector CT imaging of the abdomen and pelvis was performed following the standard protocol without IV contrast. COMPARISON:  06/25/2015 FINDINGS: Lower chest: Lung bases are clear. No effusions. Heart is normal size. Hepatobiliary: Multiple gallstones within the gallbladder. No focal hepatic abnormality. Pancreas: No focal abnormality or ductal  dilatation. Spleen: No focal abnormality.  Normal size. Adrenals/Urinary Tract: Punctate nonobstructing stone in the upper pole of the left kidney. No hydronephrosis bilaterally. No ureteral stones. Urinary bladder and adrenal glands unremarkable. Stomach/Bowel: Appendix is normal. Few scattered sigmoid diverticula. Stomach and small bowel unremarkable. Vascular/Lymphatic: No evidence of aneurysm or adenopathy. Reproductive: Uterus and adnexa unremarkable.  No mass. Other: No free fluid or free air. Musculoskeletal: No acute bony abnormality. IMPRESSION: Cholelithiasis. Punctate nonobstructing left upper pole renal stone. No ureteral stones or hydronephrosis bilaterally. Electronically Signed   By: Charlett Nose M.D.   On: 07/10/2016 12:01

## 2016-08-11 ENCOUNTER — Emergency Department (HOSPITAL_BASED_OUTPATIENT_CLINIC_OR_DEPARTMENT_OTHER)
Admission: EM | Admit: 2016-08-11 | Discharge: 2016-08-11 | Disposition: A | Discharge status: Home / Self Care | Payer: Self-pay | Attending: Emergency Medicine | Admitting: Emergency Medicine

## 2016-08-11 ENCOUNTER — Encounter (HOSPITAL_BASED_OUTPATIENT_CLINIC_OR_DEPARTMENT_OTHER): Payer: Self-pay | Admitting: *Deleted

## 2016-08-11 DIAGNOSIS — J45909 Unspecified asthma, uncomplicated: Secondary | ICD-10-CM | POA: Insufficient documentation

## 2016-08-11 DIAGNOSIS — F909 Attention-deficit hyperactivity disorder, unspecified type: Secondary | ICD-10-CM | POA: Insufficient documentation

## 2016-08-11 DIAGNOSIS — Z87891 Personal history of nicotine dependence: Secondary | ICD-10-CM | POA: Insufficient documentation

## 2016-08-11 DIAGNOSIS — G43009 Migraine without aura, not intractable, without status migrainosus: Secondary | ICD-10-CM | POA: Insufficient documentation

## 2016-08-11 DIAGNOSIS — I10 Essential (primary) hypertension: Secondary | ICD-10-CM | POA: Insufficient documentation

## 2016-08-11 MED ORDER — ONDANSETRON HCL 4 MG/2ML IJ SOLN
4.0000 mg | Freq: Once | INTRAMUSCULAR | Status: AC
  Administered 2016-08-11: 4 mg via INTRAVENOUS
  Filled 2016-08-11: qty 2

## 2016-08-11 MED ORDER — METOCLOPRAMIDE HCL 5 MG/ML IJ SOLN
10.0000 mg | Freq: Once | INTRAMUSCULAR | Status: AC
  Administered 2016-08-11: 10 mg via INTRAVENOUS
  Filled 2016-08-11: qty 2

## 2016-08-11 MED ORDER — SODIUM CHLORIDE 0.9 % IV SOLN
INTRAVENOUS | Status: DC
Start: 2016-08-11 — End: 2016-08-11
  Administered 2016-08-11: 16:00:00 via INTRAVENOUS

## 2016-08-11 MED ORDER — DIPHENHYDRAMINE HCL 50 MG/ML IJ SOLN
25.0000 mg | Freq: Once | INTRAMUSCULAR | Status: AC
  Administered 2016-08-11: 25 mg via INTRAVENOUS
  Filled 2016-08-11: qty 1

## 2016-08-11 NOTE — Discharge Instructions (Signed)
Consider trying over-the-counter medication such as Excedrin Migraine if he has any recurrent episodes, follow-up with her primary care doctor as needed

## 2016-08-11 NOTE — ED Provider Notes (Signed)
[facility] Provider Note   CSN: [id number] Arrival date & time: 08/11/16  1310  By signing my name below, I, Modena Jansky, attest that this documentation has been prepared under the direction and in the presence of Linwood Dibbles, MD. Electronically Signed: Modena Jansky, Scribe. 08/11/2016. 3:29 PM.  History   Chief Complaint Chief Complaint  Patient presents with  . Migraine   The history is provided by the patient. No language interpreter was used.   HPI Comments: Carol [name] is a 31 y.o. female with a PMHx of migraines who presents to the Emergency Department complaining of constant moderate headache that started this morning. She reports her pain feel simliar to prior migraines. No known trauma. Her pain radiates from her nose to her occipital region. She reports associated vomiting. Denies any fever, focal numbness/weakness, or other complaints at this time.      PCP: Kirstie Peri, MD  Past Medical History:  Diagnosis Date  . Anxiety   . Asthma   . Bronchitis   . Depression   . Drug-seeking behavior   . Fatty liver disease, nonalcoholic   . Gall stones   . Gall stones   . Hypertension   . Increased frequency of headaches   . Irritable bowel   . Kidney stones   . Migraines   . Urinary tract infection     Patient Active Problem List   Diagnosis Date Noted  . MDD (major depressive disorder), recurrent severe, without psychosis (HCC) 11/01/2013  . 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 Borderline behavior 05/08/2012  . ADHD (attention deficit hyperactivity disorder) 05/08/2012  . Insomnia secondary to depression with anxiety 05/08/2012    Past Surgical History:  Procedure Laterality Date  . NO PAST SURGERIES      OB History    Gravida Para Term Preterm AB Living   0 0 0 0 0 0   SAB TAB Ectopic Multiple Live Births   0 0 0 0         Home Medications    Prior to Admission medications   Not on File    Family History Family History  Problem  Relation Age of Onset  . Physical abuse Mother   . Anxiety disorder Mother   . ADD / ADHD Mother   . Paranoid behavior Mother   . Drug abuse Father   . Depression Father   . Alcohol abuse Maternal Grandfather   . ADD / ADHD Maternal Grandmother   . Anxiety disorder Maternal Grandmother   . Dementia Maternal Grandmother   . OCD Maternal Grandmother   . Sexual abuse Maternal Grandmother   . Alcohol abuse Paternal Grandfather   . Bipolar disorder Cousin   . Schizophrenia Neg Hx   . Seizures Neg Hx     Social History Social History  Substance Use Topics  . Smoking status: Former Smoker    Packs/day: 0.25    Types: Cigarettes  . Smokeless tobacco: Never Used  . Alcohol use No     Allergies   Sulfa antibiotics; Pseudoephedrine; Cephalosporins; Ciprocin-fluocin-procin [fluocinolone acetonide]; Compazine [prochlorperazine]; Flagyl [metronidazole]; Hydrochlorothiazide; Penicillins; and Toradol [ketorolac tromethamine]   Review of Systems Review of Systems  Constitutional: Negative for fever.  Gastrointestinal: Positive for vomiting.  Neurological: Positive for headaches. Negative for weakness and numbness.  All other systems reviewed and are negative.    Physical Exam Updated Vital Signs BP 122/80 (BP Location: Left Arm)   Pulse 82   Temp 97.6 F (36.4 C) (Oral)  Resp 16   Ht  (1.651 m)   Wt 250 lb (113.4 kg)   LMP 07/12/2016   SpO2 100%   BMI 41.60 kg/m   Physical Exam  Constitutional: She appears well-developed and well-nourished. No distress.  HENT:  Head: Normocephalic and atraumatic.  Right Ear: External ear normal.  Left Ear: External ear normal.  Eyes: Conjunctivae are normal. Right eye exhibits no discharge. Left eye exhibits no discharge. No scleral icterus.  Neck: Normal range of motion. Neck supple. No tracheal deviation present.  Cardiovascular: Normal rate, regular rhythm and intact distal pulses.   Pulmonary/Chest: Effort normal and breath  sounds normal. No stridor. No respiratory distress. She has no wheezes. She has no rales.  Abdominal: Soft. Bowel sounds are normal. She exhibits no distension. There is no tenderness. There is no rebound and no guarding.  Musculoskeletal: She exhibits no edema or tenderness.  Neurological: She is alert. She has normal strength. No cranial nerve deficit (no facial droop, extraocular movements intact, no slurred speech) or sensory deficit. She exhibits normal muscle tone. She displays no seizure activity. Coordination normal.  Skin: Skin is warm and dry. No rash noted.  Psychiatric: She has a normal mood and affect.  Nursing note and vitals reviewed.    ED Treatments / Results  DIAGNOSTIC STUDIES: Oxygen Saturation is 100% on RA, normal by my interpretation.    COORDINATION OF CARE: 3:33 PM- Pt advised of plan for treatment and pt agrees.   Radiology No results found.  Procedures Procedures (including critical care time)  Medications Ordered in ED Medications  ondansetron (ZOFRAN) injection 4 mg (4 mg Intravenous Given 08/11/16 1500)  metoCLOPramide (REGLAN) injection 10 mg (10 mg Intravenous Given 08/11/16 1540)  diphenhydrAMINE (BENADRYL) injection 25 mg (25 mg Intravenous Given 08/11/16 1540)     Initial Impression / Assessment and Plan / ED Course  I have reviewed the triage vital signs and the nursing notes.  Pertinent labs & imaging results that were available during my care of the patient were reviewed by me and considered in my medical decision making (see chart for details).  Clinical Course as of Aug 11 2257  Sat Aug 11, 2016  1709 Pt states she is feeling better and ready to go home.  [JK]    Clinical Course User Index [JK] Linwood Dibbles, MD    Patient's symptoms are consistent with a migraine headache. I doubt meningitis, stroke, hemorrhage, or other emergent condition. She responded to treatment with Reglan and Benadryl.  Final Clinical Impressions(s) / ED  Diagnoses   Final diagnoses:  Migraine without aura and without status migrainosus, not intractable    New Prescriptions There are no discharge medications for this patient.  I personally performed the services described in this documentation, which was scribed in my presence.  The recorded information has been reviewed and is accurate.    Linwood Dibbles, MD 08/11/16 [phone]

## 2016-08-11 NOTE — ED Notes (Signed)
Pt reports HA from nose to base of skull all day today with vomiting. Hx of occasional migraines.

## 2016-08-11 NOTE — ED Notes (Signed)
Pt states she is feeling better and would like to be discharge. MD made aware.

## 2016-08-11 NOTE — ED Triage Notes (Signed)
HA x 1 hour PTA.  Ambulatory, no distress noted.

## 2016-08-11 NOTE — ED Notes (Signed)
Pt requested fluid be stopped because it is making her cold

## 2016-08-12 ENCOUNTER — Ambulatory Visit
Admission: EM | Admit: 2016-08-12 | Discharge: 2016-08-12 | Disposition: A | Discharge status: Home / Self Care | Payer: Self-pay | Attending: Family Medicine | Admitting: Family Medicine

## 2016-08-12 ENCOUNTER — Encounter: Payer: Self-pay | Admitting: Gynecology

## 2016-08-12 DIAGNOSIS — B9689 Other specified bacterial agents as the cause of diseases classified elsewhere: Secondary | ICD-10-CM

## 2016-08-12 DIAGNOSIS — A630 Anogenital (venereal) warts: Secondary | ICD-10-CM

## 2016-08-12 DIAGNOSIS — N760 Acute vaginitis: Secondary | ICD-10-CM

## 2016-08-12 LAB — CHLAMYDIA/NGC RT PCR (ARMC ONLY)
Chlamydia Tr: NOT DETECTED
N gonorrhoeae: NOT DETECTED

## 2016-08-12 LAB — WET PREP, GENITAL
SPERM: NONE SEEN
Trich, Wet Prep: NONE SEEN
Yeast Wet Prep HPF POC: NONE SEEN

## 2016-08-12 MED ORDER — CLINDAMYCIN PHOSPHATE 100 MG VA SUPP: 100.0000 mg | Freq: Every day | VAGINAL | 0 refills | Status: DC

## 2016-08-12 NOTE — ED Provider Notes (Signed)
[facility]    CSN: [id number] Arrival date & time: 08/12/16  1221     History   Chief Complaint Chief Complaint  Patient presents with  . vaginal issue    HPI Carol [name] is a 31 y.o. female.   A she comes in with an atypical history. She reports having vaginal discharge for several day and also having bumps on her introitus as well. She is not sure that she's ever had HPV infection or herpes infection before in the past. If she did those were about 13 years ago. She is currently in the same sexual relationship is not aware of her partner having HPV infection. She reports last being sexually active with man in 2005 and she's been and this current relationship for about 3 years. She's never been pregnant and she has multiple allergies she's allergic to Cipro and Flagyl causes a for a total and she is allergic to penicillins and sulfa drugs. She is a recent former smoker.   The history is provided by the patient. No language interpreter was used.  Vaginal Discharge  Quality:  Malodorous and thick Severity:  Moderate Duration:  1 week Timing:  Constant Progression:  Worsening Chronicity:  New Relieved by:  Nothing Worsened by:  Nothing Ineffective treatments:  None tried Associated symptoms: genital lesions   Risk factors: unprotected sex   Risk factors: no foreign body, no gynecological surgery, no new sexual partner and no terminated pregnancy     Past Medical History:  Diagnosis Date  . Anxiety   . Asthma   . Bronchitis   . Depression   . Drug-seeking behavior   . Fatty liver disease, nonalcoholic   . Gall stones   . Gall stones   . Hypertension   . Increased frequency of headaches   . Irritable bowel   . Kidney stones   . Migraines   . Urinary tract infection     Patient Active Problem List   Diagnosis Date Noted  . MDD (major depressive disorder), recurrent severe, without psychosis (HCC) 11/01/2013  . 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  Borderline behavior 05/08/2012  . ADHD (attention deficit hyperactivity disorder) 05/08/2012  . Insomnia secondary to depression with anxiety 05/08/2012    Past Surgical History:  Procedure Laterality Date  . NO PAST SURGERIES      OB History    Gravida Para Term Preterm AB Living   0 0 0 0 0 0   SAB TAB Ectopic Multiple Live Births   0 0 0 0         Home Medications    Prior to Admission medications   Not on File    Family History Family History  Problem Relation Age of Onset  . Physical abuse Mother   . Anxiety disorder Mother   . ADD / ADHD Mother   . Paranoid behavior Mother   . Drug abuse Father   . Depression Father   . Alcohol abuse Maternal Grandfather   . ADD / ADHD Maternal Grandmother   . Anxiety disorder Maternal Grandmother   . Dementia Maternal Grandmother   . OCD Maternal Grandmother   . Sexual abuse Maternal Grandmother   . Alcohol abuse Paternal Grandfather   . Bipolar disorder Cousin   . Schizophrenia Neg Hx   . Seizures Neg Hx     Social History Social History  Substance Use Topics  . Smoking status: Former Smoker    Packs/day: 0.25    Types: Cigarettes  .  Smokeless tobacco: Never Used  . Alcohol use No     Allergies   Sulfa antibiotics; Pseudoephedrine; Cephalosporins; Ciprocin-fluocin-procin [fluocinolone acetonide]; Compazine [prochlorperazine]; Flagyl [metronidazole]; Hydrochlorothiazide; Penicillins; and Toradol [ketorolac tromethamine]   Review of Systems Review of Systems  Genitourinary: Positive for vaginal discharge.  All other systems reviewed and are negative.    Physical Exam Triage Vital Signs ED Triage Vitals  Enc Vitals Group     BP 08/12/16 1301 (!) 136/104     Pulse Rate 08/12/16 1301 91     Resp 08/12/16 1301 18     Temp 08/12/16 1301 98 F (36.7 C)     Temp Source 08/12/16 1301 Oral     SpO2 08/12/16 1301 99 %     Weight 08/12/16 1301 250 lb (113.4 kg)     Height 08/12/16 1301  (1.651 m)      Head Circumference --      Peak Flow --      Pain Score 08/12/16 1302 10     Pain Loc --      Pain Edu? --      Excl. in GC? --    No data found.   Updated Vital Signs BP (!) 136/104 (BP Location: Left Arm)   Pulse 91   Temp 98 F (36.7 C) (Oral)   Resp 18   Ht  (1.651 m)   Wt 250 lb (113.4 kg)   LMP 07/10/2016   SpO2 99%   BMI 41.60 kg/m   Visual Acuity Right Eye Distance:   Left Eye Distance:   Bilateral Distance:    Right Eye Near:   Left Eye Near:    Bilateral Near:     Physical Exam  Constitutional: She is oriented to person, place, and time. She appears well-developed and well-nourished.  HENT:  Head: Normocephalic and atraumatic.  Right Ear: External ear normal.  Left Ear: External ear normal.  Eyes: Pupils are equal, round, and reactive to light.  Neck: Normal range of motion.  Pulmonary/Chest: Effort normal.  Genitourinary: Rectum normal and uterus normal.    There is lesion on the right labia. There is lesion on the left labia. Cervix exhibits discharge and friability. Right adnexum displays no mass. Left adnexum displays no mass. Vaginal discharge found.  Genitourinary Comments: Patient has multiple lesions on the introitus from the area between the vagina and the rectum that appears be consistent with a papilloma virus or genital warts infestation. Saw no signs of herpes and she does have AV discharge she was sent to lab for evaluation use.  Uterus appears to be Firm but otherwise unremarkable  Musculoskeletal: Normal range of motion.  Neurological: She is alert and oriented to person, place, and time.  Skin: Skin is warm.  Psychiatric: She has a normal mood and affect. Her behavior is normal.  Vitals reviewed.    UC Treatments / Results  Labs (all labs ordered are listed, but only abnormal results are displayed) Labs Reviewed  CHLAMYDIA/NGC RT PCR ([facility] ONLY)  WET PREP, GENITAL    EKG  EKG Interpretation None        Radiology No results found.  Procedures Procedures (including critical care time)  Medications Ordered in UC Medications - No data to display   Initial Impression / Assessment and Plan / UC Course  I have reviewed the triage vital signs and the nursing notes.  Pertinent labs & imaging results that were available during my care of the patient were reviewed by  me and considered in my medical decision making (see chart for details).       Final Clinical Impressions(s) / UC Diagnoses   Final diagnoses:  Acute vaginitis  HPV (human papilloma virus) anogenital infection    New Prescriptions New Prescriptions   No medications on file     Hassan Rowan, MD 08/12/16 1642
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
## 2016-09-03 ENCOUNTER — Emergency Department: Payer: Self-pay

## 2016-09-03 ENCOUNTER — Emergency Department
Admission: EM | Admit: 2016-09-03 | Discharge: 2016-09-03 | Disposition: A | Discharge status: Home / Self Care | Payer: Self-pay | Attending: Emergency Medicine | Admitting: Emergency Medicine

## 2016-09-03 DIAGNOSIS — I10 Essential (primary) hypertension: Secondary | ICD-10-CM | POA: Insufficient documentation

## 2016-09-03 DIAGNOSIS — Z87891 Personal history of nicotine dependence: Secondary | ICD-10-CM | POA: Insufficient documentation

## 2016-09-03 DIAGNOSIS — R0789 Other chest pain: Secondary | ICD-10-CM | POA: Insufficient documentation

## 2016-09-03 DIAGNOSIS — Z79899 Other long term (current) drug therapy: Secondary | ICD-10-CM | POA: Insufficient documentation

## 2016-09-03 DIAGNOSIS — J45909 Unspecified asthma, uncomplicated: Secondary | ICD-10-CM | POA: Insufficient documentation

## 2016-09-03 LAB — CBC
HEMATOCRIT: 41.5 % (ref 35.0–47.0)
HEMOGLOBIN: 14.4 g/dL (ref 12.0–16.0)
MCH: 30.2 pg (ref 26.0–34.0)
MCHC: 34.8 g/dL (ref 32.0–36.0)
MCV: 86.9 fL (ref 80.0–100.0)
Platelets: 329 10*3/uL (ref 150–440)
RBC: 4.78 MIL/uL (ref 3.80–5.20)
RDW: 12.4 % (ref 11.5–14.5)
WBC: 13.1 10*3/uL — AB (ref 3.6–11.0)

## 2016-09-03 LAB — BASIC METABOLIC PANEL
ANION GAP: 10 (ref 5–15)
BUN: 16 mg/dL (ref 6–20)
CALCIUM: 9 mg/dL (ref 8.9–10.3)
CO2: 24 mmol/L (ref 22–32)
Chloride: 105 mmol/L (ref 101–111)
Creatinine, Ser: 0.78 mg/dL (ref 0.44–1.00)
GFR calc Af Amer: >60 mL/min (ref >60)
Glucose, Bld: 97 mg/dL (ref 65–99)
POTASSIUM: 3.7 mmol/L (ref 3.5–5.1)
SODIUM: 139 mmol/L (ref 135–145)

## 2016-09-03 LAB — TROPONIN I

## 2016-09-03 MED ORDER — ACETAMINOPHEN 500 MG PO TABS
1000.0000 mg | ORAL_TABLET | Freq: Once | ORAL | Status: AC
  Administered 2016-09-03: 1000 mg via ORAL
  Filled 2016-09-03: qty 2

## 2016-09-03 MED ORDER — KETOROLAC TROMETHAMINE 30 MG/ML IJ SOLN
INTRAMUSCULAR | Status: AC
  Filled 2016-09-03: qty 1

## 2016-09-03 NOTE — ED Provider Notes (Signed)
[facility] Emergency Department Provider Note  ____________________________________________   I have reviewed the triage vital signs and the nursing notes.   HISTORY  Chief Complaint Chest Pain    HPI Carol [name] is a 31 y.o. female with multiple different visits emergency department to say usually for pain-related complaints, has a history of what is described as "drug seeking behavior" on prior visits, presents to the left-sided chest pain. His been there since yesterday. States that she's been very anxious because she has some atypical findings on her Pap smear, patient states she is not sexually active with men, she denies any pleuritic pain or shortness of breath. She states that she is not taking any birth control pills. She has no personal or family history of PE or DVT. No recent travel or leg swelling. No recent surgery. Patient is complaining of a chest wall discomfort. Hurts when she touches it or when she changes position. She denies any fever or chills. It is not exertional. It is worse only when she moves it. The patient states that she has not had any trauma that she can recollect. She denies any numbness or weakness.    Past Medical History:  Diagnosis Date  . Anxiety   . Asthma   . Bronchitis   . Depression   . Drug-seeking behavior   . Fatty liver disease, nonalcoholic   . Gall stones   . Gall stones   . Hypertension   . Increased frequency of headaches   . Irritable bowel   . Kidney stones   . Migraines   . Urinary tract infection     Patient Active Problem List   Diagnosis Date Noted  . MDD (major depressive disorder), recurrent severe, without psychosis (HCC) 11/01/2013  . 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 Borderline behavior 05/08/2012  . ADHD (attention deficit hyperactivity disorder) 05/08/2012  . Insomnia secondary to depression with anxiety 05/08/2012    Past Surgical History:  Procedure Laterality Date  . NO PAST  SURGERIES      Prior to Admission medications   Medication Sig Start Date End Date Taking? Authorizing Provider  clindamycin (CLEOCIN) 100 MG vaginal suppository Place 1 suppository (100 mg total) vaginally at bedtime. Patient not taking: Reported on 09/03/2016 08/12/16   Hassan RowanWade, Eugene, MD    Allergies Sulfa antibiotics; Pseudoephedrine; Cephalosporins; Ciprocin-fluocin-procin [fluocinolone acetonide]; Compazine [prochlorperazine]; Flagyl [metronidazole]; Hydrochlorothiazide; Penicillins; and Toradol [ketorolac tromethamine]  Family History  Problem Relation Age of Onset  . Physical abuse Mother   . Anxiety disorder Mother   . ADD / ADHD Mother   . Paranoid behavior Mother   . Drug abuse Father   . Depression Father   . Alcohol abuse Maternal Grandfather   . ADD / ADHD Maternal Grandmother   . Anxiety disorder Maternal Grandmother   . Dementia Maternal Grandmother   . OCD Maternal Grandmother   . Sexual abuse Maternal Grandmother   . Alcohol abuse Paternal Grandfather   . Bipolar disorder Cousin   . Schizophrenia Neg Hx   . Seizures Neg Hx     Social History Social History  Substance Use Topics  . Smoking status: Former Smoker    Packs/day: 0.25    Types: Cigarettes  . Smokeless tobacco: Never Used  . Alcohol use No    Review of Systems Constitutional: No fever/chills Eyes: No visual changes. ENT: No sore throat. No stiff neck no neck pain Cardiovascular: See history of present illness Respiratory: Denies shortness of breath. Gastrointestinal:  no vomiting.  No diarrhea.  No constipation. Genitourinary: Negative for dysuria. Musculoskeletal: Negative lower extremity swelling Skin: Negative for rash. Neurological: Negative for severe headaches, focal weakness or numbness. 10-point ROS otherwise negative.  ____________________________________________   PHYSICAL EXAM:  VITAL SIGNS: ED Triage Vitals  Enc Vitals Group     BP 09/03/16 0617 (!) 129/97     Pulse  Rate 09/03/16 0617 91     Resp 09/03/16 0617 14     Temp 09/03/16 0617 98.7 F (37.1 C)     Temp Source 09/03/16 0617 Oral     SpO2 09/03/16 0617 98 %     Weight 09/03/16 0618 227 lb (103 kg)     Height 09/03/16 0618 5\' 6"  (1.676 m)     Head Circumference --      Peak Flow --      Pain Score 09/03/16 0615 5     Pain Loc --      Pain Edu? --      Excl. in GC? --     Constitutional: Alert and oriented. Well appearing and in no acute distress. Eyes: Conjunctivae are normal. PERRL. EOMI. Head: Atraumatic. Nose: No congestion/rhinnorhea. Mouth/Throat: Mucous membranes are moist.  Oropharynx non-erythematous. Neck: No stridor.   Nontender with no meningismus Cardiovascular: Normal rate, regular rhythm. Grossly normal heart sounds.  Good peripheral circulation. Respiratory: Normal respiratory effort.  No retractions. Lungs CTAB. Chest: Female nurse chaperone present, Erskine Squibb, patient has refused with chest wall pain in the pectoralis muscle with no shingles, no lesions no crepitus no flail chest, and I touch this area patient states "ouch that's the pain right there" and pulls back. No obvious skin lesions no inflammation noted. Abdominal: Soft and nontender. No distention. No guarding no rebound Back:  There is no focal tenderness or step off.  there is no midline tenderness there are no lesions noted. there is no CVA tenderness Musculoskeletal: No lower extremity tenderness, no upper extremity tenderness. No joint effusions, no DVT signs strong distal pulses no edema Neurologic:  Normal speech and language. No gross focal neurologic deficits are appreciated.  Skin:  Skin is warm, dry and intact. No rash noted. Psychiatric: Mood and affect are normal. Speech and behavior are normal.  ____________________________________________   LABS (all labs ordered are listed, but only abnormal results are displayed)  Labs Reviewed  CBC - Abnormal; Notable for the following:       Result Value    WBC 13.1 (*)    All other components within normal limits  BASIC METABOLIC PANEL  TROPONIN I   ____________________________________________  EKG  I personally interpreted any EKGs ordered by me or triage Sinus rhythm rate 91 no acute ST elevation or acute ST depression, no acute ischemic changes. ____________________________________________  RADIOLOGY  I reviewed any imaging ordered by me or triage that were performed during my shift and, if possible, patient and/or family made aware of any abnormal findings. ____________________________________________   PROCEDURES  Procedure(s) performed: None  Procedures  Critical Care performed: None  ____________________________________________   INITIAL IMPRESSION / ASSESSMENT AND PLAN / ED COURSE  Pertinent labs & imaging results that were available during my care of the patient were reviewed by me and considered in my medical decision making (see chart for details). Patient here with very reproducible chest wall pain. She has perked negative. I do not believe she has a PE. Patient denies pregnancy, does not wish to have a pregnancy test this time has she knows she is  not pregnant. At this time, there does not appear to be clinical evidence to support the diagnosis of pulmonary embolus, dissection, myocarditis, endocarditis, pericarditis, pericardial tamponade, acute coronary syndrome, pneumothorax, pneumonia, or any other acute intrathoracic pathology that will require admission or acute intervention. Nor is there evidence of any significant intra-abdominal pathology causing this discomfort. We were going to give her Toradol for this clearly muscular skeletal pain however she has a "allergy" to Toradol therefore we will give her Tylenol. Close outpatient follow-up given and understood. Despite having pain since 6 PM last night, troponin is negative, I don't think serial enzymes are negative nor do I think a CT is warranted. Chest x-ray is  reassuring. Return precautions follow-up given and understood    ____________________________________________   FINAL CLINICAL IMPRESSION(S) / ED DIAGNOSES  Final diagnoses:  None      This chart was dictated using voice recognition software.  Despite best efforts to proofread,  errors can occur which can change meaning.      Jeanmarie Plant, MD 09/03/16 [phone]

## 2016-09-03 NOTE — Discharge Instructions (Signed)
Return to the emergency room for any new or worrisome symptoms. If there is worsening chest pain, shortness of breath, difficulty breathing or you feel worse in any way, please return to the emergency room. Otherwise follow closely with primary care doctor.

## 2016-09-03 NOTE — ED Notes (Signed)
Pt alert and oriented X4, active, cooperative, pt in NAD. RR even and unlabored, color WNL.  Pt informed to return if any life threatening symptoms occur.  Pt left with all of her belongings in possession.

## 2016-09-03 NOTE — ED Triage Notes (Signed)
Pt states had left sided shoulder and neck pain last night. Pt states she awoke from sleep with "a twinge in my chest". Pt appears in no acute distress.

## 2016-09-04 ENCOUNTER — Other Ambulatory Visit (HOSPITAL_COMMUNITY)
Admission: RE | Admit: 2016-09-04 | Discharge: 2016-09-04 | Disposition: A | Discharge status: Home / Self Care | Payer: Self-pay | Source: Ambulatory Visit | Attending: Unknown Physician Specialty | Admitting: Unknown Physician Specialty

## 2016-09-04 DIAGNOSIS — N879 Dysplasia of cervix uteri, unspecified: Secondary | ICD-10-CM | POA: Insufficient documentation

## 2016-09-12 ENCOUNTER — Encounter (HOSPITAL_BASED_OUTPATIENT_CLINIC_OR_DEPARTMENT_OTHER): Payer: Self-pay | Admitting: *Deleted

## 2016-09-12 ENCOUNTER — Emergency Department (HOSPITAL_BASED_OUTPATIENT_CLINIC_OR_DEPARTMENT_OTHER)
Admission: EM | Admit: 2016-09-12 | Discharge: 2016-09-12 | Disposition: A | Discharge status: Home / Self Care | Payer: Self-pay | Attending: Dermatology | Admitting: Dermatology

## 2016-09-12 DIAGNOSIS — N939 Abnormal uterine and vaginal bleeding, unspecified: Secondary | ICD-10-CM | POA: Insufficient documentation

## 2016-09-12 DIAGNOSIS — R1030 Lower abdominal pain, unspecified: Secondary | ICD-10-CM | POA: Insufficient documentation

## 2016-09-12 DIAGNOSIS — J45909 Unspecified asthma, uncomplicated: Secondary | ICD-10-CM | POA: Insufficient documentation

## 2016-09-12 DIAGNOSIS — I10 Essential (primary) hypertension: Secondary | ICD-10-CM | POA: Insufficient documentation

## 2016-09-12 DIAGNOSIS — Z87891 Personal history of nicotine dependence: Secondary | ICD-10-CM | POA: Insufficient documentation

## 2016-09-12 NOTE — ED Triage Notes (Signed)
Pt here for continued lower abd pain and vaginal DC

## 2016-09-12 NOTE — ED Notes (Signed)
Pt came out of room and states that she has not seen a doctor so she is leaving.  Pt appears to have just woken up, ambulatory to exit without issue.

## 2016-10-03 ENCOUNTER — Encounter: Payer: Self-pay | Admitting: Obstetrics and Gynecology

## 2016-10-22 ENCOUNTER — Encounter: Payer: Self-pay | Admitting: Obstetrics and Gynecology

## 2016-10-22 ENCOUNTER — Ambulatory Visit (INDEPENDENT_AMBULATORY_CARE_PROVIDER_SITE_OTHER): Payer: Self-pay | Admitting: Obstetrics and Gynecology

## 2016-10-22 VITALS — BP 130/92 | HR 82 | Wt 234.0 lb

## 2016-10-22 DIAGNOSIS — A630 Anogenital (venereal) warts: Secondary | ICD-10-CM | POA: Insufficient documentation

## 2016-10-22 NOTE — Progress Notes (Signed)
   [facility]  @DATE @            Patient name: Carol [name] MRN [id number]  Date of birth: 06-06-1985  CC & HPI: New Patient  Carol [name] is a 31 y.o. female presenting today for genital warts to the vulva that were treated by TCA without success at the [facility]. She reports having 1 treatment at the beginning of June 2018 which has left her with increased sensitivity and pain to the treated area. She denies new sexual partner in the last 1 year. She denies examining herself. She states her partner does not have anything similar.   She also complains of bilateral gluteal pain, hip pain, and pubic bone pain.     ROS:  ROS +condyloma  +vulvar pain  Pertinent History Reviewed:   Reviewed: Significant for  Medical         Past Medical History:  Diagnosis Date  . Anxiety   . Asthma   . Bronchitis   . Depression   . Drug-seeking behavior   . Fatty liver disease, nonalcoholic   . Gall stones   . Gall stones   . Hypertension   . Increased frequency of headaches   . Irritable bowel   . Kidney stones   . Migraines   . Urinary tract infection                               Surgical Hx:    Past Surgical History:  Procedure Laterality Date  . NO PAST SURGERIES     Medications: Reviewed & Updated - see associated section                       Current Outpatient Prescriptions:  .  ALPRAZolam (XANAX) 1 MG tablet, Take 1 mg by mouth 3 (three) times daily., Disp: , Rfl:  .  buPROPion (WELLBUTRIN XL) 150 MG 24 hr tablet, Take 150 mg by mouth daily., Disp: , Rfl:  .  clindamycin (CLEOCIN) 100 MG vaginal suppository, Place 1 suppository (100 mg total) vaginally at bedtime. (Patient not taking: Reported on 09/03/2016), Disp: 3 suppository, Rfl: 0   Social History: Reviewed -  reports that she has quit smoking. Her smoking use included Cigarettes. She smoked 0.25 packs per day. She has never used smokeless tobacco.  Objective Findings:  Vitals:  Blood pressure (!) 130/92, pulse 82, weight 234 lb (106.1 kg), last menstrual period 10/22/2016.  Physical Examination: General appearance - alert, well appearing, and in no distress Mental status - alert, oriented to person, place, and time Pelvic - VULVA: 4-5 small 3 mm areas on posterior fourchette of tiny condylomas    Assessment & Plan:   A:  1. Vulvar condyloma   P:  1. Rx for 85% TCA 2. Follow up in 2 wk    By signing my name below, I, Sonum Patel, attest that this documentation has been prepared under the direction and in the presence of Tilda BurrowFerguson, Colena Ketterman V, MD. Electronically Signed: Sonum Patel, Neurosurgeoncribe. 10/22/16. 1:48 PM.  I personally performed the services described in this documentation, which was SCRIBED in my presence. The recorded information has been reviewed and considered accurate. It has been edited as necessary during review. Tilda BurrowFERGUSON,Tasia Liz V, MD

## 2016-11-05 ENCOUNTER — Ambulatory Visit: Payer: Self-pay | Admitting: Obstetrics and Gynecology

## 2016-11-07 ENCOUNTER — Ambulatory Visit: Payer: Self-pay | Admitting: Obstetrics and Gynecology

## 2016-11-19 ENCOUNTER — Ambulatory Visit: Payer: Self-pay | Admitting: Obstetrics and Gynecology

## 2016-12-19 ENCOUNTER — Encounter: Payer: Self-pay | Admitting: Obstetrics and Gynecology

## 2016-12-19 ENCOUNTER — Ambulatory Visit (INDEPENDENT_AMBULATORY_CARE_PROVIDER_SITE_OTHER): Payer: Self-pay | Admitting: Obstetrics and Gynecology

## 2016-12-19 VITALS — BP 130/88 | HR 78 | Ht 65.0 in | Wt 235.6 lb

## 2016-12-19 DIAGNOSIS — Z113 Encounter for screening for infections with a predominantly sexual mode of transmission: Secondary | ICD-10-CM

## 2016-12-19 DIAGNOSIS — R102 Pelvic and perineal pain: Secondary | ICD-10-CM

## 2016-12-19 MED ORDER — DOXYCYCLINE HYCLATE 100 MG PO CAPS: 100.0000 mg | ORAL_CAPSULE | Freq: Two times a day (BID) | ORAL | 1 refills | Status: DC

## 2016-12-19 MED ORDER — TRAMADOL HCL 50 MG PO TABS: 50.0000 mg | ORAL_TABLET | Freq: Four times a day (QID) | ORAL | 0 refills | Status: DC | PRN

## 2016-12-19 NOTE — Addendum Note (Signed)
Addended by: Tish Frederickson A on: 12/19/2016 12:32 PM   Modules accepted: Orders

## 2016-12-19 NOTE — Progress Notes (Signed)
Patient ID: Carol [name], female   DOB: Mar 16, 1986, 31 y.o.   MRN: [id number]   [facility] Clinic Visit  @DATE @            Patient name: Carol [name] MRN [id number]  Date of birth: 1985/05/15  CC & HPI:  Carol [name] is a [id number] 31 y.o. female presenting today for constant vaginal and pelvic pain x 1 month. She states that she intermittently gets sharp pains to her vaginal/pelvic area. Pt reports associated dyspareunia, weight gain, and decreased appetite. Patient's last menstrual period was 11/23/2016 she notes that she is not currently on birth control due to being married in a monogamous relationship with a same-sex partner. Pt reports that she will need another prescription of 85% TCA due to accidentally spilling it. She denies any other symptoms. Denies having children.     ROS:  ROS  +dyspareunia +vaginal pain +pelvic pain +decreased appetite  All systems are negative except as noted in the HPI and PMH.      Pertinent History Reviewed:   Reviewed: Significant for  Medical         Past Medical History:  Diagnosis Date  . Anxiety   . Asthma   . Bronchitis   . Depression   . Drug-seeking behavior   . Fatty liver disease, nonalcoholic   . Gall stones   . Gall stones   . Hypertension   . Increased frequency of headaches   . Irritable bowel   . Kidney stones   . Migraines   . Urinary tract infection                               Surgical Hx:    Past Surgical History:  Procedure Laterality Date  . NO PAST SURGERIES     Medications: Reviewed & Updated - see associated section                       Current Outpatient Prescriptions:  .  ALPRAZolam (XANAX) 1 MG tablet, Take 1 mg by mouth 3 (three) times daily as needed. , Disp: , Rfl:  .  buPROPion (WELLBUTRIN XL) 150 MG 24 hr tablet, Take 150 mg by mouth daily., Disp: , Rfl:    Social History: Reviewed -  reports that she has quit smoking. Her smoking use included Cigarettes. She smoked 0.25  packs per day. She has never used smokeless tobacco.  Objective Findings:  Vitals: Blood pressure 130/88, pulse 78, height 5\' 5"  (1.651 m), weight 235 lb 9.6 oz (106.9 kg), last menstrual period 11/23/2016.  Physical Examination:  Pelvic - normal external genitalia, vulva, vagina, cervix, uterus and adnexa, VULVA: normal appearing vulva with no masses, tenderness or lesions,  VAGINA: normal appearing vagina with normal color and discharge, no lesions. Normal appearing secretions.  CERVIX: normal appearing cervix without discharge or lesions,  UTERUS: uterus is normal size, shape, consistency, well supported, and anterior non purulent. severely tender to touch, diffuse pelvic pain all the way across the pelvis both adnexa and uterus.  ADNEXA: normal adnexa in size and no masses, tenderness bilateral,    Assessment & Plan:   A:  1. Pelvic pain r/o pelvic infection  P:  1. Labs (TSH, CBC, and Sed Rate) to be completed today 2. 10 day BID doxycycline Rx 3. Tramadol Rx 4. Follow up in 2 weeks   By signing my name below, I,  Soijett Blue, attest that this documentation has been prepared under the direction and in the presence of Tilda Burrow, MD. Electronically Signed: Soijett Blue, ED Scribe. 12/19/16. 10:36 AM.  I personally performed the services described in this documentation, which was SCRIBED in my presence. The recorded information has been reviewed and considered accurate. It has been edited as necessary during review. Tilda Burrow, MD

## 2016-12-21 LAB — GC/CHLAMYDIA PROBE AMP
Chlamydia trachomatis, NAA: NEGATIVE
NEISSERIA GONORRHOEAE BY PCR: NEGATIVE

## 2016-12-25 ENCOUNTER — Telehealth: Payer: Self-pay | Admitting: Emergency Medicine

## 2016-12-25 ENCOUNTER — Emergency Department
Admission: EM | Admit: 2016-12-25 | Discharge: 2016-12-25 | Disposition: A | Discharge status: Home / Self Care | Payer: Self-pay | Attending: Emergency Medicine | Admitting: Emergency Medicine

## 2016-12-25 DIAGNOSIS — R1013 Epigastric pain: Secondary | ICD-10-CM | POA: Insufficient documentation

## 2016-12-25 DIAGNOSIS — Z5321 Procedure and treatment not carried out due to patient leaving prior to being seen by health care provider: Secondary | ICD-10-CM | POA: Insufficient documentation

## 2016-12-25 LAB — CBC
HEMATOCRIT: 40.7 % (ref 35.0–47.0)
Hemoglobin: 14.3 g/dL (ref 12.0–16.0)
MCH: 30.4 pg (ref 26.0–34.0)
MCHC: 35.1 g/dL (ref 32.0–36.0)
MCV: 86.7 fL (ref 80.0–100.0)
PLATELETS: 318 10*3/uL (ref 150–440)
RBC: 4.69 MIL/uL (ref 3.80–5.20)
RDW: 12.6 % (ref 11.5–14.5)
WBC: 11.8 10*3/uL — AB (ref 3.6–11.0)

## 2016-12-25 LAB — URINALYSIS, COMPLETE (UACMP) WITH MICROSCOPIC
BACTERIA UA: NONE SEEN
Bilirubin Urine: NEGATIVE
Glucose, UA: NEGATIVE mg/dL
KETONES UR: NEGATIVE mg/dL
Leukocytes, UA: NEGATIVE
Nitrite: NEGATIVE
PH: 5 (ref 5.0–8.0)
PROTEIN: NEGATIVE mg/dL
Specific Gravity, Urine: 1.027 (ref 1.005–1.030)

## 2016-12-25 LAB — COMPREHENSIVE METABOLIC PANEL
ALBUMIN: 3.9 g/dL (ref 3.5–5.0)
ALT: 23 U/L (ref 14–54)
ANION GAP: 9 (ref 5–15)
AST: 18 U/L (ref 15–41)
Alkaline Phosphatase: 43 U/L (ref 38–126)
BILIRUBIN TOTAL: 0.4 mg/dL (ref 0.3–1.2)
BUN: 14 mg/dL (ref 6–20)
CHLORIDE: 106 mmol/L (ref 101–111)
CO2: 26 mmol/L (ref 22–32)
Calcium: 8.8 mg/dL — ABNORMAL LOW (ref 8.9–10.3)
Creatinine, Ser: 0.85 mg/dL (ref 0.44–1.00)
GFR calc Af Amer: >60 mL/min (ref >60)
GLUCOSE: 103 mg/dL — AB (ref 65–99)
POTASSIUM: 3.6 mmol/L (ref 3.5–5.1)
Sodium: 141 mmol/L (ref 135–145)
TOTAL PROTEIN: 7.2 g/dL (ref 6.5–8.1)

## 2016-12-25 LAB — TROPONIN I: Troponin I: <0.03 ng/mL (ref <0.03)

## 2016-12-25 LAB — LIPASE, BLOOD: LIPASE: 26 U/L (ref 11–51)

## 2016-12-25 NOTE — ED Triage Notes (Signed)
Pt in with co upper abd pain that started this am, states worse after she eats. Has hx reflux in the past, but did not take anything at home.

## 2016-12-25 NOTE — Telephone Encounter (Signed)
Called patient due to lwot to inquire about condition and follow up plans. Left message.   

## 2017-01-01 ENCOUNTER — Other Ambulatory Visit: Payer: Self-pay | Admitting: Obstetrics and Gynecology

## 2017-01-01 ENCOUNTER — Telehealth: Payer: Self-pay | Admitting: Obstetrics and Gynecology

## 2017-01-01 DIAGNOSIS — R102 Pelvic and perineal pain: Secondary | ICD-10-CM

## 2017-01-01 NOTE — Telephone Encounter (Signed)
Informed patient the Dr Emelda FearFerguson reviewed her lab results from hospital and results show no active infection. Informed he wanted to repeat labs and would go from there. Verbalized understanding. Will put on lab schedule for tomorrow.

## 2017-01-01 NOTE — Telephone Encounter (Signed)
Patient called stating that she would like to speak with Dr. Rayna SextonFerguson's nurse, Patient did not state the reason why. Please contact pt

## 2017-01-01 NOTE — Telephone Encounter (Signed)
Patient called stating she has been taking Tramadol and Doxycycline as prescribed but is still hurting.  She has an appointment on 9/12 but doesn't know what to do in the mean time.

## 2017-01-02 ENCOUNTER — Other Ambulatory Visit: Payer: Self-pay

## 2017-01-03 ENCOUNTER — Other Ambulatory Visit: Payer: Self-pay

## 2017-01-09 ENCOUNTER — Ambulatory Visit: Payer: Self-pay | Admitting: Obstetrics and Gynecology

## 2017-01-31 ENCOUNTER — Emergency Department (HOSPITAL_BASED_OUTPATIENT_CLINIC_OR_DEPARTMENT_OTHER)
Admission: EM | Admit: 2017-01-31 | Discharge: 2017-01-31 | Disposition: A | Discharge status: Home / Self Care | Payer: Self-pay | Attending: Emergency Medicine | Admitting: Emergency Medicine

## 2017-01-31 ENCOUNTER — Encounter (HOSPITAL_BASED_OUTPATIENT_CLINIC_OR_DEPARTMENT_OTHER): Payer: Self-pay

## 2017-01-31 DIAGNOSIS — Z87891 Personal history of nicotine dependence: Secondary | ICD-10-CM | POA: Insufficient documentation

## 2017-01-31 DIAGNOSIS — Z79899 Other long term (current) drug therapy: Secondary | ICD-10-CM | POA: Insufficient documentation

## 2017-01-31 DIAGNOSIS — I10 Essential (primary) hypertension: Secondary | ICD-10-CM | POA: Insufficient documentation

## 2017-01-31 DIAGNOSIS — K8050 Calculus of bile duct without cholangitis or cholecystitis without obstruction: Secondary | ICD-10-CM | POA: Insufficient documentation

## 2017-01-31 DIAGNOSIS — J45909 Unspecified asthma, uncomplicated: Secondary | ICD-10-CM | POA: Insufficient documentation

## 2017-01-31 HISTORY — DX: Attention-deficit hyperactivity disorder, unspecified type: F90.9

## 2017-01-31 HISTORY — DX: Disorder of central nervous system, unspecified: G96.9

## 2017-01-31 HISTORY — DX: Major depressive disorder, recurrent severe without psychotic features: F33.2

## 2017-01-31 HISTORY — DX: Anogenital (venereal) warts: A63.0

## 2017-01-31 LAB — CBC
HEMATOCRIT: 41.2 % (ref 36.0–46.0)
HEMOGLOBIN: 14.2 g/dL (ref 12.0–15.0)
MCH: 30.4 pg (ref 26.0–34.0)
MCHC: 34.5 g/dL (ref 30.0–36.0)
MCV: 88.2 fL (ref 78.0–100.0)
PLATELETS: 338 10*3/uL (ref 150–400)
RBC: 4.67 MIL/uL (ref 3.87–5.11)
RDW: 12.3 % (ref 11.5–15.5)
WBC: 10.2 10*3/uL (ref 4.0–10.5)

## 2017-01-31 LAB — URINALYSIS, ROUTINE W REFLEX MICROSCOPIC
Bilirubin Urine: NEGATIVE
GLUCOSE, UA: NEGATIVE mg/dL
Hgb urine dipstick: NEGATIVE
Ketones, ur: NEGATIVE mg/dL
Nitrite: NEGATIVE
PROTEIN: NEGATIVE mg/dL
pH: 6 (ref 5.0–8.0)

## 2017-01-31 LAB — COMPREHENSIVE METABOLIC PANEL WITH GFR
ALT: 26 U/L (ref 14–54)
AST: 20 U/L (ref 15–41)
Albumin: 3.9 g/dL (ref 3.5–5.0)
Alkaline Phosphatase: 46 U/L (ref 38–126)
Anion gap: 6 (ref 5–15)
BUN: 16 mg/dL (ref 6–20)
CO2: 25 mmol/L (ref 22–32)
Calcium: 9 mg/dL (ref 8.9–10.3)
Chloride: 107 mmol/L (ref 101–111)
Creatinine, Ser: 0.62 mg/dL (ref 0.44–1.00)
GFR calc Af Amer: >60 mL/min
GFR calc non Af Amer: >60 mL/min
Glucose, Bld: 103 mg/dL — ABNORMAL HIGH (ref 65–99)
Potassium: 3.8 mmol/L (ref 3.5–5.1)
Sodium: 138 mmol/L (ref 135–145)
Total Bilirubin: 0.2 mg/dL — ABNORMAL LOW (ref 0.3–1.2)
Total Protein: 7 g/dL (ref 6.5–8.1)

## 2017-01-31 LAB — URINALYSIS, MICROSCOPIC (REFLEX)

## 2017-01-31 LAB — PREGNANCY, URINE: PREG TEST UR: NEGATIVE

## 2017-01-31 LAB — LIPASE, BLOOD: Lipase: 34 U/L (ref 11–51)

## 2017-01-31 MED ORDER — ONDANSETRON HCL 4 MG/2ML IJ SOLN
4.0000 mg | Freq: Once | INTRAMUSCULAR | Status: AC
  Administered 2017-01-31: 4 mg via INTRAVENOUS
  Filled 2017-01-31: qty 2

## 2017-01-31 MED ORDER — SODIUM CHLORIDE 0.9 % IV BOLUS (SEPSIS)
1000.0000 mL | Freq: Once | INTRAVENOUS | Status: AC
  Administered 2017-01-31: 1000 mL via INTRAVENOUS

## 2017-01-31 MED ORDER — HYDROMORPHONE HCL 1 MG/ML IJ SOLN
1.0000 mg | Freq: Once | INTRAMUSCULAR | Status: AC
  Administered 2017-01-31: 1 mg via INTRAVENOUS
  Filled 2017-01-31: qty 1

## 2017-01-31 NOTE — ED Provider Notes (Signed)
[facility] Note   CSN: [id number] Arrival date & time: 01/31/17  0557     History   Chief Complaint Chief Complaint  Patient presents with  . Abdominal Pain    HPI Carol [name] is a 31 y.o. female.  HPI Patient said 31 year old female presents emergency department with development of acute onset right upper quadrant pain with radiation towards her right back and shoulder.  She reports nausea vomiting.  No diarrhea.  No dysuria or urinary frequency.  She denies lower abdominal pain.  She has a known history of gallstones.  To this point she has had no health insurance and therefore she is been unable to have elective cholecystectomy.  Her insurance status is changing and she should have health insurance in the next 7-10 days.  Pain is moderate to severe in severity.  No chest pain or shortness of breath.   Past Medical History:  Diagnosis Date  . Anxiety   . Asthma   . Bronchitis   . Depression   . Drug-seeking behavior   . Fatty liver disease, nonalcoholic   . Gall stones   . Gall stones   . Hypertension   . Increased frequency of headaches   . Irritable bowel   . Kidney stones   . Migraines   . Urinary tract infection     Patient Active Problem List   Diagnosis Date Noted  . Pelvic pain in female 12/19/2016  . Condyloma acuminatum of vulva 10/22/2016  . MDD (major depressive disorder), recurrent severe, without psychosis (HCC) 11/01/2013  . 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 Borderline behavior 05/08/2012  . ADHD (attention deficit hyperactivity disorder) 05/08/2012  . Insomnia secondary to depression with anxiety 05/08/2012    Past Surgical History:  Procedure Laterality Date  . NO PAST SURGERIES      OB History    Gravida Para Term Preterm AB Living   0 0 0 0 0 0   SAB TAB Ectopic Multiple Live Births   0 0 0 0         Home Medications    Prior to Admission medications   Medication Sig Start Date End Date Taking? Authorizing  Provider  ALPRAZolam Prudy Feeler) 1 MG tablet Take 1 mg by mouth 3 (three) times daily as needed.    Yes [provider]  buPROPion (WELLBUTRIN XL) 150 MG 24 hr tablet Take 150 mg by mouth daily.   Yes [provider]  doxycycline (VIBRAMYCIN) 100 MG capsule Take 1 capsule (100 mg total) by mouth 2 (two) times daily. 12/19/16  Yes Tilda Burrow, MD  traMADol (ULTRAM) 50 MG tablet Take 1 tablet (50 mg total) by mouth every 6 (six) hours as needed for moderate pain or severe pain. 12/19/16  Yes Tilda Burrow, MD    Family History Family History  Problem Relation Age of Onset  . Physical abuse Mother   . Anxiety disorder Mother   . ADD / ADHD Mother   . Paranoid behavior Mother   . Drug abuse Father   . Depression Father   . Alcohol abuse Maternal Grandfather   . ADD / ADHD Maternal Grandmother   . Anxiety disorder Maternal Grandmother   . Dementia Maternal Grandmother   . OCD Maternal Grandmother   . Sexual abuse Maternal Grandmother   . Alcohol abuse Paternal Grandfather   . Bipolar disorder Cousin   . Schizophrenia Neg Hx   . Seizures Neg Hx     Social  History Social History  Substance Use Topics  . Smoking status: Former Smoker    Packs/day: 0.25    Types: Cigarettes  . Smokeless tobacco: Never Used  . Alcohol use No     Allergies   Sulfa antibiotics; Pseudoephedrine; Cephalosporins; Ciprocin-fluocin-procin [fluocinolone acetonide]; Compazine [prochlorperazine]; Flagyl [metronidazole]; Hydrochlorothiazide; Penicillins; and Toradol [ketorolac tromethamine]   Review of Systems Review of Systems  All other systems reviewed and are negative.    Physical Exam Updated Vital Signs BP (!) 146/95 (BP Location: Right Arm)   Pulse 75   Temp 97.9 F (36.6 C) (Oral)   Resp 18   Ht  (1.651 m)   Wt 97.5 kg (215 lb)   LMP 01/23/2017   SpO2 99%   BMI 35.78 kg/m   Physical Exam  Constitutional: She is oriented to person, place, and time. She  appears well-developed and well-nourished. No distress.  HENT:  Head: Normocephalic and atraumatic.  Eyes: EOM are normal.  Neck: Normal range of motion.  Cardiovascular: Normal rate, regular rhythm and normal heart sounds.   Pulmonary/Chest: Effort normal and breath sounds normal.  Abdominal: Soft. She exhibits no distension.  Mild right upper quadrant tenderness.  No guarding or rebound.  Musculoskeletal: Normal range of motion.  Neurological: She is alert and oriented to person, place, and time.  Skin: Skin is warm and dry.  Psychiatric: She has a normal mood and affect. Judgment normal.  Nursing note and vitals reviewed.    ED Treatments / Results  Labs (all labs ordered are listed, but only abnormal results are displayed) Labs Reviewed  URINALYSIS, ROUTINE W REFLEX MICROSCOPIC  PREGNANCY, URINE  CBC  COMPREHENSIVE METABOLIC PANEL  LIPASE, BLOOD    EKG  EKG Interpretation None       Radiology No results found.  Procedures Procedures (including critical care time)  Medications Ordered in ED Medications  HYDROmorphone (DILAUDID) injection 1 mg (not administered)  ondansetron (ZOFRAN) injection 4 mg (4 mg Intravenous Given 01/31/17 0638)  sodium chloride 0.9 % bolus 1,000 mL (1,000 mLs Intravenous New Bag/Given 01/31/17 1610)     Initial Impression / Assessment and Plan / ED Course  I have reviewed the triage vital signs and the nursing notes.  Pertinent labs & imaging results that were available during my care of the patient were reviewed by me and considered in my medical decision making (see chart for details).     Suspect acute ileocolic.  Pain and nausea treated at this time.  IV fluids.  Urine pending.  We'll obtain labs including LFTs and lipase.  6:55 AM Has not received pain medication yet. Will tx pain and reevaluate.   Care transferred to morning provider  Final Clinical Impressions(s) / ED Diagnoses   Final diagnoses:  None    New  Prescriptions New Prescriptions   No medications on file     Azalia Bilis, MD 01/31/17 [phone]

## 2017-01-31 NOTE — ED Notes (Signed)
ED Provider at bedside. 

## 2017-01-31 NOTE — ED Notes (Signed)
Iv d/c tip intact per protocol

## 2017-01-31 NOTE — ED Provider Notes (Signed)
Patient is pain-free. Abdominal exam is benign. Will be discharged with friend at bedside. Advised to follow-up with [facility] [place] for elective cholecystectomy. Return precautions given.   Loren Racer, MD 01/31/17 [phone]

## 2017-01-31 NOTE — ED Notes (Signed)
Pt drove herself to this ER. Pt informed that before receiving narcotic she needed to have a ride prior to discharge. Pt reported she had a ride on the way.

## 2017-01-31 NOTE — ED Triage Notes (Signed)
Pt reports a dull pain to her RUQ that radiates to her back and into her left shoulder blade.  Pt reports nausea, denies vomiting.

## 2017-02-01 LAB — URINE CULTURE

## 2017-02-20 ENCOUNTER — Encounter (HOSPITAL_BASED_OUTPATIENT_CLINIC_OR_DEPARTMENT_OTHER): Payer: Self-pay | Admitting: Emergency Medicine

## 2017-02-20 ENCOUNTER — Emergency Department
Admission: EM | Admit: 2017-02-20 | Discharge: 2017-02-20 | Disposition: A | Discharge status: Home / Self Care | Payer: Self-pay | Attending: Emergency Medicine | Admitting: Emergency Medicine

## 2017-02-20 ENCOUNTER — Other Ambulatory Visit: Payer: Self-pay

## 2017-02-20 ENCOUNTER — Encounter: Payer: Self-pay | Admitting: Emergency Medicine

## 2017-02-20 ENCOUNTER — Emergency Department: Payer: Self-pay

## 2017-02-20 ENCOUNTER — Emergency Department (HOSPITAL_BASED_OUTPATIENT_CLINIC_OR_DEPARTMENT_OTHER)
Admission: EM | Admit: 2017-02-20 | Discharge: 2017-02-20 | Disposition: A | Discharge status: Home / Self Care | Payer: Self-pay | Attending: Emergency Medicine | Admitting: Emergency Medicine

## 2017-02-20 DIAGNOSIS — R079 Chest pain, unspecified: Secondary | ICD-10-CM | POA: Insufficient documentation

## 2017-02-20 DIAGNOSIS — Z87891 Personal history of nicotine dependence: Secondary | ICD-10-CM | POA: Insufficient documentation

## 2017-02-20 DIAGNOSIS — M79602 Pain in left arm: Secondary | ICD-10-CM

## 2017-02-20 DIAGNOSIS — R0789 Other chest pain: Secondary | ICD-10-CM

## 2017-02-20 DIAGNOSIS — R1013 Epigastric pain: Secondary | ICD-10-CM | POA: Insufficient documentation

## 2017-02-20 DIAGNOSIS — M542 Cervicalgia: Secondary | ICD-10-CM | POA: Insufficient documentation

## 2017-02-20 DIAGNOSIS — M25512 Pain in left shoulder: Secondary | ICD-10-CM | POA: Insufficient documentation

## 2017-02-20 DIAGNOSIS — Z5321 Procedure and treatment not carried out due to patient leaving prior to being seen by health care provider: Secondary | ICD-10-CM | POA: Insufficient documentation

## 2017-02-20 DIAGNOSIS — Z79899 Other long term (current) drug therapy: Secondary | ICD-10-CM | POA: Insufficient documentation

## 2017-02-20 DIAGNOSIS — J45909 Unspecified asthma, uncomplicated: Secondary | ICD-10-CM | POA: Insufficient documentation

## 2017-02-20 DIAGNOSIS — I10 Essential (primary) hypertension: Secondary | ICD-10-CM | POA: Insufficient documentation

## 2017-02-20 LAB — BASIC METABOLIC PANEL
ANION GAP: 8 (ref 5–15)
BUN: 11 mg/dL (ref 6–20)
CHLORIDE: 107 mmol/L (ref 101–111)
CO2: 23 mmol/L (ref 22–32)
Calcium: 8.9 mg/dL (ref 8.9–10.3)
Creatinine, Ser: 0.78 mg/dL (ref 0.44–1.00)
GFR calc Af Amer: >60 mL/min (ref >60)
GLUCOSE: 119 mg/dL — AB (ref 65–99)
POTASSIUM: 3.6 mmol/L (ref 3.5–5.1)
Sodium: 138 mmol/L (ref 135–145)

## 2017-02-20 LAB — COMPREHENSIVE METABOLIC PANEL
ALBUMIN: 4 g/dL (ref 3.5–5.0)
ALK PHOS: 42 U/L (ref 38–126)
ALT: 22 U/L (ref 14–54)
ANION GAP: 5 (ref 5–15)
AST: 17 U/L (ref 15–41)
BILIRUBIN TOTAL: 0.4 mg/dL (ref 0.3–1.2)
BUN: 12 mg/dL (ref 6–20)
CO2: 26 mmol/L (ref 22–32)
Calcium: 8.8 mg/dL — ABNORMAL LOW (ref 8.9–10.3)
Chloride: 107 mmol/L (ref 101–111)
Creatinine, Ser: 0.7 mg/dL (ref 0.44–1.00)
GLUCOSE: 94 mg/dL (ref 65–99)
POTASSIUM: 4 mmol/L (ref 3.5–5.1)
Sodium: 138 mmol/L (ref 135–145)
TOTAL PROTEIN: 7 g/dL (ref 6.5–8.1)

## 2017-02-20 LAB — CBC
HEMATOCRIT: 43.2 % (ref 35.0–47.0)
HEMOGLOBIN: 14.5 g/dL (ref 12.0–16.0)
MCH: 29.8 pg (ref 26.0–34.0)
MCHC: 33.5 g/dL (ref 32.0–36.0)
MCV: 89 fL (ref 80.0–100.0)
Platelets: 325 10*3/uL (ref 150–440)
RBC: 4.86 MIL/uL (ref 3.80–5.20)
RDW: 12.5 % (ref 11.5–14.5)
WBC: 11.8 10*3/uL — ABNORMAL HIGH (ref 3.6–11.0)

## 2017-02-20 LAB — TROPONIN I: Troponin I: <0.03 ng/mL (ref <0.03)

## 2017-02-20 LAB — LIPASE, BLOOD: LIPASE: 27 U/L (ref 11–51)

## 2017-02-20 MED ORDER — GI COCKTAIL ~~LOC~~
30.0000 mL | Freq: Once | ORAL | Status: AC
  Administered 2017-02-20: 30 mL via ORAL
  Filled 2017-02-20: qty 30

## 2017-02-20 MED ORDER — ACETAMINOPHEN 500 MG PO TABS
1000.0000 mg | ORAL_TABLET | Freq: Once | ORAL | Status: AC
  Administered 2017-02-20: 1000 mg via ORAL
  Filled 2017-02-20: qty 2

## 2017-02-20 NOTE — ED Triage Notes (Signed)
Patient to ER for c/o pain to left shoulder, down left arm, and up to left neck. States pain started while laying in bed. Also reports pain to mid chest. Denies any known injury.

## 2017-02-20 NOTE — ED Triage Notes (Addendum)
Pt reports left shoulder pain with radiation down her forearm. Mild chest pain with no rad that began while she was in bed. She states she was at [facility] this a[facility] but left before getting a bed. Pt is alert oriented in NAD at this time.

## 2017-02-20 NOTE — ED Notes (Signed)
ED Provider at bedside. 

## 2017-02-20 NOTE — ED Provider Notes (Signed)
[facility] EMERGENCY DEPARTMENT Provider Note  CSN: [id number] Arrival date & time: 02/20/17 0709  Chief Complaint(s) Shoulder Pain and Chest Pain  HPI Carol [name] is a 31 y.o. female with a past medical history of cholelithiasis, major depressive disorder, drug-seeking behavior presents to the emergency department with left arm pain noted in the left bicep which radiates to the chest and down the arm.  Patient reports that this started 6 hours prior to arrival and has been constant since onset without any alleviating or aggravating factors.  Pain is described as achiness and sharpness.  She denies any associated shortness of breath, nausea, vomiting, abdominal pain.  Patient has a history of hypertension and daily smoking.  No history of hyperlipidemia, diabetes or early heart disease in the family.  She denies any prior PEs/DVTs, recent prolonged immobilization, OCP use, history of cancer or autoimmune disorders.  No recent fevers or illnesses.  Of note patient went to [place] where she obtain screening labs which revealed mild leukocytosis which is at her baseline, normal BMP, and a normal chest x-ray.  She left because of the wait time.  HPI  Past Medical History Past Medical History:  Diagnosis Date  . ADHD   . Anxiety   . Asthma   . Bronchitis   . CNS disorder   . Depression   . Drug-seeking behavior   . Fatty liver disease, nonalcoholic   . Gall stones   . Gall stones   . Genital warts   . Hypertension   . Increased frequency of headaches   . Irritable bowel   . Kidney stones   . Major depressive disorder, recurrent severe without psychotic features (HCC)   . Migraines   . Urinary tract infection    Patient Active Problem List   Diagnosis Date Noted  . Pelvic pain in female 12/19/2016  . Condyloma acuminatum of vulva 10/22/2016  . MDD (major depressive disorder), recurrent severe, without psychosis (HCC) 11/01/2013  . 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  Borderline behavior 05/08/2012  . ADHD (attention deficit hyperactivity disorder) 05/08/2012  . Insomnia secondary to depression with anxiety 05/08/2012   Home Medication(s) Prior to Admission medications   Medication Sig Start Date End Date Taking? Authorizing Provider  ALPRAZolam Prudy Feeler) 1 MG tablet Take 1 mg by mouth 3 (three) times daily as needed.     [provider]  buPROPion (WELLBUTRIN XL) 150 MG 24 hr tablet Take 150 mg by mouth daily.    [provider]  doxycycline (VIBRAMYCIN) 100 MG capsule Take 1 capsule (100 mg total) by mouth 2 (two) times daily. 12/19/16   Tilda Burrow, MD  traMADol (ULTRAM) 50 MG tablet Take 1 tablet (50 mg total) by mouth every 6 (six) hours as needed for moderate pain or severe pain. 12/19/16   Tilda Burrow, MD  Past Surgical History Past Surgical History:  Procedure Laterality Date  . NO PAST SURGERIES     Family History Family History  Problem Relation Age of Onset  . Physical abuse Mother   . Anxiety disorder Mother   . ADD / ADHD Mother   . Paranoid behavior Mother   . Drug abuse Father   . Depression Father   . Alcohol abuse Maternal Grandfather   . ADD / ADHD Maternal Grandmother   . Anxiety disorder Maternal Grandmother   . Dementia Maternal Grandmother   . OCD Maternal Grandmother   . Sexual abuse Maternal Grandmother   . Alcohol abuse Paternal Grandfather   . Bipolar disorder Cousin   . Schizophrenia Neg Hx   . Seizures Neg Hx     Social History Social History  Substance Use Topics  . Smoking status: Former Smoker    Packs/day: 0.25    Types: Cigarettes  . Smokeless tobacco: Never Used  . Alcohol use No   Allergies Sulfa antibiotics; Pseudoephedrine; Cephalosporins; Ciprocin-fluocin-procin [fluocinolone acetonide]; Compazine [prochlorperazine]; Flagyl [metronidazole];  Hydrochlorothiazide; Penicillins; and Toradol [ketorolac tromethamine]  Review of Systems Review of Systems All other systems are reviewed and are negative for acute change except as noted in the HPI  Physical Exam Vital Signs  I have reviewed the triage vital signs BP (!) 121/103 (BP Location: Right Arm)   Pulse 81   Temp 98.1 F (36.7 C) (Oral)   Resp 18   Ht 5\' 6"  (1.676 m)   Wt 96.2 kg (212 lb)   LMP 01/23/2017 (Exact Date)   SpO2 100%   BMI 34.22 kg/m   Physical Exam  Constitutional: She is oriented to person, place, and time. She appears well-developed and well-nourished. No distress.  HENT:  Head: Normocephalic and atraumatic.  Nose: Nose normal.  Eyes: Pupils are equal, round, and reactive to light. Conjunctivae and EOM are normal. Right eye exhibits no discharge. Left eye exhibits no discharge. No scleral icterus.  Neck: Normal range of motion. Neck supple.  Cardiovascular: Normal rate and regular rhythm.  Exam reveals no gallop and no friction rub.   No murmur heard. Pulmonary/Chest: Effort normal and breath sounds normal. No stridor. No respiratory distress. She has no rales. She exhibits no tenderness.  Abdominal: Soft. She exhibits no distension. There is tenderness in the epigastric area. There is no rigidity, no rebound, no guarding, no CVA tenderness and negative Murphy's sign.  Musculoskeletal: She exhibits no edema or tenderness.       Left shoulder: She exhibits normal range of motion, no tenderness, no bony tenderness, no swelling, no deformity and no pain.       Left upper arm: She exhibits no tenderness, no bony tenderness, no swelling and no edema.  Neurological: She is alert and oriented to person, place, and time.  Skin: Skin is warm and dry. No rash noted. She is not diaphoretic. No erythema.  Psychiatric: She has a normal mood and affect.  Vitals reviewed.   ED Results and Treatments Labs (all labs ordered are listed, but only abnormal results are  displayed) Labs Reviewed  COMPREHENSIVE METABOLIC PANEL - Abnormal; Notable for the following:       Result Value   Calcium 8.8 (*)    All other components within normal limits  LIPASE, BLOOD  TROPONIN I  EKG  EKG Interpretation  Date/Time:    Ventricular Rate:    PR Interval:    QRS Duration:   QT Interval:    QTC Calculation:   R Axis:     Text Interpretation:        Radiology Dg Chest 2 View  Result Date: 02/20/2017 CLINICAL DATA:  31 year old female with chest pain. EXAM: CHEST  2 VIEW COMPARISON:  Chest radiograph dated 09/03/2016 FINDINGS: The heart size and mediastinal contours are within normal limits. Both lungs are clear. The visualized skeletal structures are unremarkable. IMPRESSION: No active cardiopulmonary disease. Electronically Signed   By: Elgie CollardArash  Radparvar M.D.   On: 02/20/2017 02:56   Pertinent labs & imaging results that were available during my care of the patient were reviewed by me and considered in my medical decision making (see chart for details).  Medications Ordered in ED Medications  gi cocktail (Maalox,Lidocaine,Donnatal) (30 mLs Oral Given 02/20/17 0846)  acetaminophen (TYLENOL) tablet 1,000 mg (1,000 mg Oral Given 02/20/17 0845)                                                                                                                                    Procedures Procedures  (including critical care time)  Medical Decision Making / ED Course I have reviewed the nursing notes for this encounter and the patient's prior records (if available in EHR or on provided paperwork).    1.  Chest and left arm pain Atypical in nature, highly inconsistent with ACS.  EKG without acute ischemic changes or evidence of pericarditis.  Patient is 6 hours from onset of pain, feel that a single troponin would be sufficient to rule out ACS.   Low pretest probability for pulmonary embolism and patient has PERC negative. Chest x-ray without evidence suggestive of pneumonia, pneumothorax, pneumomediastinum.  No abnormal contour of the mediastinum to suggest dissection. No evidence of acute injuries.  Presentation is not classic for aortic dissection or esophageal perforation..  2.  Abdominal tenderness Known history of gallstones.  LFTs and lipase obtain which were within normal limits.  She does not have evidence of acute cholecystitis or pancreatitis.  I have low suspicion for serious intra-abdominal inflammatory/infectious process.  Patient may be having referred pain from gallbladder disease.  Already has been referred to general surgeon for elective cholecystectomy evaluation.  The patient is safe for discharge with strict return precautions.    Final Clinical Impression(s) / ED Diagnoses Final diagnoses:  Atypical chest pain  Left arm pain  Epigastric pain    Disposition: Discharge  Condition: Good  I have discussed the results, Dx and Tx plan with the patient who expressed understanding and agree(s) with the plan. Discharge instructions discussed at great length. The patient was given strict return precautions who verbalized understanding of the instructions. No further questions at time of discharge.    New Prescriptions   No medications on file  Follow Up: Primary care provider  Schedule an appointment as soon as possible for a visit       This chart was dictated using voice recognition software.  Despite best efforts to proofread,  errors can occur which can change the documentation meaning.   Nira Conn, MD 02/20/17 [phone]

## 2017-02-20 NOTE — ED Notes (Signed)
Pt verbalized understanding of discharge instructions and denies any further questions at this time.   

## 2017-02-23 ENCOUNTER — Emergency Department
Admission: EM | Admit: 2017-02-23 | Discharge: 2017-02-24 | Disposition: A | Discharge status: Home / Self Care | Payer: Self-pay | Attending: Emergency Medicine | Admitting: Emergency Medicine

## 2017-02-23 ENCOUNTER — Encounter: Payer: Self-pay | Admitting: Emergency Medicine

## 2017-02-23 ENCOUNTER — Other Ambulatory Visit: Payer: Self-pay

## 2017-02-23 DIAGNOSIS — R51 Headache: Secondary | ICD-10-CM | POA: Insufficient documentation

## 2017-02-23 DIAGNOSIS — Z79899 Other long term (current) drug therapy: Secondary | ICD-10-CM | POA: Insufficient documentation

## 2017-02-23 DIAGNOSIS — F17210 Nicotine dependence, cigarettes, uncomplicated: Secondary | ICD-10-CM | POA: Insufficient documentation

## 2017-02-23 DIAGNOSIS — R519 Headache, unspecified: Secondary | ICD-10-CM

## 2017-02-23 DIAGNOSIS — I10 Essential (primary) hypertension: Secondary | ICD-10-CM | POA: Insufficient documentation

## 2017-02-23 DIAGNOSIS — J45909 Unspecified asthma, uncomplicated: Secondary | ICD-10-CM | POA: Insufficient documentation

## 2017-02-23 DIAGNOSIS — R1110 Vomiting, unspecified: Secondary | ICD-10-CM | POA: Insufficient documentation

## 2017-02-23 LAB — COMPREHENSIVE METABOLIC PANEL
ALBUMIN: 4.3 g/dL (ref 3.5–5.0)
ALT: 28 U/L (ref 14–54)
AST: 26 U/L (ref 15–41)
Alkaline Phosphatase: 46 U/L (ref 38–126)
Anion gap: 12 (ref 5–15)
BUN: 14 mg/dL (ref 6–20)
CHLORIDE: 105 mmol/L (ref 101–111)
CO2: 23 mmol/L (ref 22–32)
Calcium: 9.1 mg/dL (ref 8.9–10.3)
Creatinine, Ser: 0.86 mg/dL (ref 0.44–1.00)
GFR calc Af Amer: >60 mL/min (ref >60)
GFR calc non Af Amer: >60 mL/min (ref >60)
GLUCOSE: 97 mg/dL (ref 65–99)
POTASSIUM: 3.4 mmol/L — AB (ref 3.5–5.1)
Sodium: 140 mmol/L (ref 135–145)
Total Bilirubin: 0.5 mg/dL (ref 0.3–1.2)
Total Protein: 7.8 g/dL (ref 6.5–8.1)

## 2017-02-23 LAB — CBC
HCT: 42.6 % (ref 35.0–47.0)
Hemoglobin: 14.8 g/dL (ref 12.0–16.0)
MCH: 30.5 pg (ref 26.0–34.0)
MCHC: 34.6 g/dL (ref 32.0–36.0)
MCV: 88.1 fL (ref 80.0–100.0)
Platelets: 318 10*3/uL (ref 150–440)
RBC: 4.83 MIL/uL (ref 3.80–5.20)
RDW: 12.4 % (ref 11.5–14.5)
WBC: 11 10*3/uL (ref 3.6–11.0)

## 2017-02-23 LAB — LIPASE, BLOOD: LIPASE: 21 U/L (ref 11–51)

## 2017-02-23 LAB — TROPONIN I: Troponin I: <0.03 ng/mL (ref <0.03)

## 2017-02-23 MED ORDER — ONDANSETRON 4 MG PO TBDP
4.0000 mg | ORAL_TABLET | Freq: Once | ORAL | Status: AC | PRN
  Administered 2017-02-23: 4 mg via ORAL
  Filled 2017-02-23: qty 1

## 2017-02-23 NOTE — ED Notes (Signed)
Patient to stat desk via wheelchair by EMS for headache for couple hours with nausea/vomiting.  EMS VS - hr 65; bp 145/91 pulse ox 100%.

## 2017-02-23 NOTE — ED Triage Notes (Signed)
Pt presents to ED via EMS with right sided headache and vomiting for the past "couple of hours". No hx of the same. Actively just prior to triage in waiting room. Pt denies similar headache in the past. During triage pt states that her back and her chest "feels funny" described as a "warm fuzzy feeling". pt states she feels like she is "going to pass out".  Pt states that her heart felt like it was racing prior to EMS arrival; This has improved slightly. Pt currently has no increased work of breathing noted. Able to answer questions without difficulty.

## 2017-02-24 ENCOUNTER — Emergency Department: Payer: Self-pay

## 2017-02-24 MED ORDER — BUTALBITAL-APAP-CAFFEINE 50-325-40 MG PO TABS: 1.0000 | ORAL_TABLET | Freq: Four times a day (QID) | ORAL | 0 refills | Status: DC | PRN

## 2017-02-24 MED ORDER — DIPHENHYDRAMINE HCL 50 MG/ML IJ SOLN
25.0000 mg | Freq: Once | INTRAMUSCULAR | Status: AC
  Administered 2017-02-24: 25 mg via INTRAVENOUS
  Filled 2017-02-24: qty 1

## 2017-02-24 MED ORDER — METOCLOPRAMIDE HCL 5 MG/ML IJ SOLN
10.0000 mg | Freq: Once | INTRAMUSCULAR | Status: AC
  Administered 2017-02-24: 10 mg via INTRAVENOUS
  Filled 2017-02-24: qty 2

## 2017-02-24 MED ORDER — SODIUM CHLORIDE 0.9 % IV BOLUS (SEPSIS)
1000.0000 mL | Freq: Once | INTRAVENOUS | Status: AC
  Administered 2017-02-24: 1000 mL via INTRAVENOUS

## 2017-02-24 NOTE — ED Provider Notes (Addendum)
[facility] Emergency Department Provider Note  ____________________________________________   First MD Initiated Contact with Patient 02/23/17 2355     (approximate)  I have reviewed the triage vital signs and the nursing notes.   HISTORY  Chief Complaint Headache and Emesis   HPI Carol [name] is a 31 y.o. female with a history of anxiety and migraines who is presenting to the emergency department today with a headache.  Says the headache started at about 7:00 tonight and was a 5 out of 10 at that time.  However, she says it suddenly progressed to a 10 out of 10 at about 730.  She had multiple episodes of vomiting as well as photophobia.  She is also been experiencing pain to the right ear over the past 1-2 weeks.  She denies any pain in her neck.  Says the pain is a throbbing and remains a 10 out of 10.  Past Medical History:  Diagnosis Date  . ADHD   . Anxiety   . Asthma   . Bronchitis   . CNS disorder   . Depression   . Drug-seeking behavior   . Fatty liver disease, nonalcoholic   . Gall stones   . Gall stones   . Genital warts   . Hypertension   . Increased frequency of headaches   . Irritable bowel   . Kidney stones   . Major depressive disorder, recurrent severe without psychotic features (HCC)   . Migraines   . Urinary tract infection     Patient Active Problem List   Diagnosis Date Noted  . Pelvic pain in female 12/19/2016  . Condyloma acuminatum of vulva 10/22/2016  . MDD (major depressive disorder), recurrent severe, without psychosis (HCC) 11/01/2013  . 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 Borderline behavior 05/08/2012  . ADHD (attention deficit hyperactivity disorder) 05/08/2012  . Insomnia secondary to depression with anxiety 05/08/2012    Past Surgical History:  Procedure Laterality Date  . NO PAST SURGERIES      Prior to Admission medications   Medication Sig Start Date End Date Taking? Authorizing Provider  ALPRAZolam  Prudy Feeler) 1 MG tablet Take 1 mg by mouth 3 (three) times daily as needed.     [provider]  buPROPion (WELLBUTRIN XL) 150 MG 24 hr tablet Take 150 mg by mouth daily.    [provider]  doxycycline (VIBRAMYCIN) 100 MG capsule Take 1 capsule (100 mg total) by mouth 2 (two) times daily. 12/19/16   Tilda Burrow, MD  traMADol (ULTRAM) 50 MG tablet Take 1 tablet (50 mg total) by mouth every 6 (six) hours as needed for moderate pain or severe pain. 12/19/16   Tilda Burrow, MD    Allergies Sulfa antibiotics; Pseudoephedrine; Cephalosporins; Ciprocin-fluocin-procin [fluocinolone acetonide]; Compazine [prochlorperazine]; Flagyl [metronidazole]; Hydrochlorothiazide; Penicillins; and Toradol [ketorolac tromethamine]  Family History  Problem Relation Age of Onset  . Physical abuse Mother   . Anxiety disorder Mother   . ADD / ADHD Mother   . Paranoid behavior Mother   . Drug abuse Father   . Depression Father   . Alcohol abuse Maternal Grandfather   . ADD / ADHD Maternal Grandmother   . Anxiety disorder Maternal Grandmother   . Dementia Maternal Grandmother   . OCD Maternal Grandmother   . Sexual abuse Maternal Grandmother   . Alcohol abuse Paternal Grandfather   . Bipolar disorder Cousin   . Schizophrenia Neg Hx   . Seizures Neg Hx  Social History Social History  Substance Use Topics  . Smoking status: Current Every Day Smoker    Packs/day: 0.25    Types: Cigarettes  . Smokeless tobacco: Never Used  . Alcohol use No    Review of Systems  Constitutional: No fever/chills Eyes: No visual changes. ENT: No sore throat. Cardiovascular: palpitations  Respiratory: Denies shortness of breath. Gastrointestinal: No abdominal pain. No diarrhea.  No constipation. Genitourinary: Negative for dysuria. Musculoskeletal: Negative for back pain. Skin: Negative for rash. Neurological: Negative for focal weakness or  numbness.   ____________________________________________   PHYSICAL EXAM:  VITAL SIGNS: ED Triage Vitals [02/23/17 2056]  Enc Vitals Group     BP (!) 145/115     Pulse Rate (!) 113     Resp      Temp 97.7 F (36.5 C)     Temp Source Oral     SpO2 98 %     Weight 220 lb (99.8 kg)     Height 5\' 6"  (1.676 m)     Head Circumference      Peak Flow      Pain Score 10     Pain Loc      Pain Edu?      Excl. in GC?     Constitutional: Alert and oriented.  Lights out in the room.  Patient without acute distress. Eyes: Conjunctivae are normal.  Head: Atraumatic.  Normal TMs bilaterally. Nose: No congestion/rhinnorhea. Mouth/Throat: Mucous membranes are moist.  Neck: No stridor.   Cardiovascular: Normal rate, regular rhythm.  Heart rate now 87 bpm in the room.  Grossly normal heart sounds.   Respiratory: Normal respiratory effort.  No retractions. Lungs CTAB. Gastrointestinal: Soft and nontender. No distention. Musculoskeletal: No lower extremity tenderness nor edema.  No joint effusions. Neurologic:  Normal speech and language. No gross focal neurologic deficits are appreciated. Skin:  Skin is warm, dry and intact. No rash noted. Psychiatric: Mood and affect are normal. Speech and behavior are normal.  ____________________________________________   LABS (all labs ordered are listed, but only abnormal results are displayed)  Labs Reviewed  COMPREHENSIVE METABOLIC PANEL - Abnormal; Notable for the following:       Result Value   Potassium 3.4 (*)    All other components within normal limits  LIPASE, BLOOD  CBC  TROPONIN I  URINALYSIS, COMPLETE (UACMP) WITH MICROSCOPIC   ____________________________________________  EKG  ED ECG REPORT I, Arelia Longest, the attending physician, personally viewed and interpreted this ECG.   Date: 02/24/2017  EKG Time: 2103  Rate: 96  Rhythm: normal sinus rhythm  Axis: Normal  Intervals:none  ST&T Change: No ST segment  elevation or depression.  No abnormal T wave inversion.  ____________________________________________  RADIOLOGY   ____________________________________________   PROCEDURES  Procedure(s) performed:   Procedures  Critical Care performed:   ____________________________________________   INITIAL IMPRESSION / ASSESSMENT AND PLAN / ED COURSE  Pertinent labs & imaging results that were available during my care of the patient were reviewed by me and considered in my medical decision making (see chart for details).  Differential diagnosis includes, but is not limited to, intracranial hemorrhage, meningitis/encephalitis, previous head trauma, cavernous venous thrombosis, tension headache, temporal arteritis, migraine or migraine equivalent, idiopathic intracranial hypertension, and non-specific headache.  As part of my medical decision making, I reviewed the following data within the electronic MEDICAL RECORD NUMBER Notes from prior ED visits  Severe onset of headache at 7:30 PM.  Will obtain a CT  scan within a 6-hour window by 1:30 AM.  The patient with history of migraines.  Likely migraine type headache.  No known history of aneurysms in the family.    ----------------------------------------- 3:19 AM on 02/24/2017 -----------------------------------------  Patient reassessed and initially said she was pain-free.  However, after speaking with her for several minutes she is saying that now she is continuing to have pain.  I offered her further treatment with Decadron as well as magnesium.  However, she says that she would rather go home with a prescription for a migraine medicine.  I will give her Fioricet.  Unlikely to be subarachnoid hemorrhage.  CAT scan within 6 hours without any acute finding.  Patient without any neurologic deficits.  Patient to return for any worsening or concerning symptoms.  Will be discharged at this time.  Likely migraine  headache.  ____________________________________________   FINAL CLINICAL IMPRESSION(S) / ED DIAGNOSES  Headache    NEW MEDICATIONS STARTED DURING THIS VISIT:  New Prescriptions   No medications on file     Note:  This document was prepared using Dragon voice recognition software and may include unintentional dictation errors.     Myrna BlazerSchaevitz, Blanche Gallien Matthew, MD 02/24/17 [id number]    Myrna BlazerSchaevitz, Halena Mohar Matthew, MD 02/24/17 [phone]

## 2017-07-19 ENCOUNTER — Encounter (HOSPITAL_BASED_OUTPATIENT_CLINIC_OR_DEPARTMENT_OTHER): Payer: Self-pay | Admitting: *Deleted

## 2017-07-19 ENCOUNTER — Emergency Department (HOSPITAL_BASED_OUTPATIENT_CLINIC_OR_DEPARTMENT_OTHER)
Admission: EM | Admit: 2017-07-19 | Discharge: 2017-07-19 | Disposition: A | Discharge status: Home / Self Care | Payer: BLUE CROSS/BLUE SHIELD | Attending: Emergency Medicine | Admitting: Emergency Medicine

## 2017-07-19 ENCOUNTER — Emergency Department (HOSPITAL_BASED_OUTPATIENT_CLINIC_OR_DEPARTMENT_OTHER): Payer: BLUE CROSS/BLUE SHIELD

## 2017-07-19 ENCOUNTER — Other Ambulatory Visit: Payer: Self-pay

## 2017-07-19 DIAGNOSIS — Z79899 Other long term (current) drug therapy: Secondary | ICD-10-CM | POA: Insufficient documentation

## 2017-07-19 DIAGNOSIS — J45909 Unspecified asthma, uncomplicated: Secondary | ICD-10-CM | POA: Diagnosis not present

## 2017-07-19 DIAGNOSIS — I10 Essential (primary) hypertension: Secondary | ICD-10-CM | POA: Insufficient documentation

## 2017-07-19 DIAGNOSIS — R102 Pelvic and perineal pain: Secondary | ICD-10-CM | POA: Diagnosis present

## 2017-07-19 DIAGNOSIS — F17210 Nicotine dependence, cigarettes, uncomplicated: Secondary | ICD-10-CM | POA: Diagnosis not present

## 2017-07-19 DIAGNOSIS — N739 Female pelvic inflammatory disease, unspecified: Secondary | ICD-10-CM | POA: Insufficient documentation

## 2017-07-19 LAB — COMPREHENSIVE METABOLIC PANEL
ALT: 25 U/L (ref 14–54)
AST: 19 U/L (ref 15–41)
Albumin: 4.7 g/dL (ref 3.5–5.0)
Alkaline Phosphatase: 55 U/L (ref 38–126)
Anion gap: 11 (ref 5–15)
BUN: 13 mg/dL (ref 6–20)
CHLORIDE: 106 mmol/L (ref 101–111)
CO2: 22 mmol/L (ref 22–32)
Calcium: 8.9 mg/dL (ref 8.9–10.3)
Creatinine, Ser: 0.89 mg/dL (ref 0.44–1.00)
GFR calc Af Amer: >60 mL/min (ref >60)
Glucose, Bld: 95 mg/dL (ref 65–99)
POTASSIUM: 3.9 mmol/L (ref 3.5–5.1)
SODIUM: 139 mmol/L (ref 135–145)
Total Bilirubin: 0.6 mg/dL (ref 0.3–1.2)
Total Protein: 8.4 g/dL — ABNORMAL HIGH (ref 6.5–8.1)

## 2017-07-19 LAB — CBC WITH DIFFERENTIAL/PLATELET
BASOS ABS: 0 10*3/uL (ref 0.0–0.1)
BASOS PCT: 0 %
EOS ABS: 0.1 10*3/uL (ref 0.0–0.7)
EOS PCT: 1 %
HCT: 44.4 % (ref 36.0–46.0)
Hemoglobin: 15.1 g/dL — ABNORMAL HIGH (ref 12.0–15.0)
Lymphocytes Relative: 24 %
Lymphs Abs: 3.1 10*3/uL (ref 0.7–4.0)
MCH: 30.2 pg (ref 26.0–34.0)
MCHC: 34 g/dL (ref 30.0–36.0)
MCV: 88.8 fL (ref 78.0–100.0)
MONO ABS: 1.5 10*3/uL — AB (ref 0.1–1.0)
Monocytes Relative: 11 %
Neutro Abs: 8.4 10*3/uL — ABNORMAL HIGH (ref 1.7–7.7)
Neutrophils Relative %: 64 %
PLATELETS: 355 10*3/uL (ref 150–400)
RBC: 5 MIL/uL (ref 3.87–5.11)
RDW: 12.7 % (ref 11.5–15.5)
WBC: 13.1 10*3/uL — AB (ref 4.0–10.5)

## 2017-07-19 LAB — URINALYSIS, ROUTINE W REFLEX MICROSCOPIC
BILIRUBIN URINE: NEGATIVE
Glucose, UA: NEGATIVE mg/dL
KETONES UR: NEGATIVE mg/dL
Nitrite: NEGATIVE
PH: 5.5 (ref 5.0–8.0)
PROTEIN: NEGATIVE mg/dL
Specific Gravity, Urine: >1.03 — ABNORMAL HIGH (ref 1.005–1.030)

## 2017-07-19 LAB — WET PREP, GENITAL
Sperm: NONE SEEN
TRICH WET PREP: NONE SEEN
YEAST WET PREP: NONE SEEN

## 2017-07-19 LAB — URINALYSIS, MICROSCOPIC (REFLEX)

## 2017-07-19 LAB — PREGNANCY, URINE: PREG TEST UR: NEGATIVE

## 2017-07-19 MED ORDER — CEFTRIAXONE SODIUM 250 MG IJ SOLR
250.0000 mg | Freq: Once | INTRAMUSCULAR | Status: AC
  Administered 2017-07-19: 250 mg via INTRAMUSCULAR
  Filled 2017-07-19: qty 250

## 2017-07-19 MED ORDER — LIDOCAINE HCL (PF) 1 % IJ SOLN
INTRAMUSCULAR | Status: AC
  Administered 2017-07-19: 5 mL
  Filled 2017-07-19: qty 5

## 2017-07-19 MED ORDER — DOXYCYCLINE HYCLATE 100 MG PO CAPS: 100.0000 mg | ORAL_CAPSULE | Freq: Two times a day (BID) | ORAL | 0 refills | Status: DC

## 2017-07-19 MED ORDER — IBUPROFEN 800 MG PO TABS
800.0000 mg | ORAL_TABLET | Freq: Once | ORAL | Status: AC
  Administered 2017-07-19: 800 mg via ORAL

## 2017-07-19 MED ORDER — HYDROCODONE-ACETAMINOPHEN 5-325 MG PO TABS
1.0000 | ORAL_TABLET | Freq: Once | ORAL | Status: AC
  Administered 2017-07-19: 1 via ORAL
  Filled 2017-07-19: qty 1

## 2017-07-19 MED ORDER — AZITHROMYCIN 250 MG PO TABS
1000.0000 mg | ORAL_TABLET | Freq: Once | ORAL | Status: AC
  Administered 2017-07-19: 1000 mg via ORAL
  Filled 2017-07-19: qty 4

## 2017-07-19 MED ORDER — ONDANSETRON 4 MG PO TBDP
4.0000 mg | ORAL_TABLET | Freq: Once | ORAL | Status: AC | PRN
  Administered 2017-07-19: 4 mg via ORAL
  Filled 2017-07-19: qty 1

## 2017-07-19 MED ORDER — IBUPROFEN 800 MG PO TABS
ORAL_TABLET | ORAL | Status: AC
  Filled 2017-07-19: qty 1

## 2017-07-19 NOTE — ED Provider Notes (Signed)
[facility] EMERGENCY DEPARTMENT Provider Note   CSN: [id number] Arrival date & time: 07/19/17  7829     History   Chief Complaint Chief Complaint  Patient presents with  . Back Pain    HPI Carol [name] is a 32 y.o. female.  The history is provided by the patient and medical records. No language interpreter was used.   Carol [name] is a 32 y.o. female  with a PMH as listed below who presents to the Emergency Department complaining of bilateral pelvic pain for a month.  Today, she states that the left side of her back began to hurt as well.  She also reports vaginal discharge with a very foul-smelling odor.  + Nausea and 2-3 episodes of vomiting today.  She denies any dysuria, urinary urgency or frequency.  She states that she had a LEEP procedure by OB/GYN last week.  She had some mild bleeding following that resolved.  She started having some vaginal bleeding again today, but believes this is her menses.  She is unsure, stating it could be abnormal bleeding.  No fever or chills.  Past Medical History:  Diagnosis Date  . ADHD   . Anxiety   . Asthma   . Bronchitis   . CNS disorder   . Depression   . Drug-seeking behavior   . Fatty liver disease, nonalcoholic   . Gall stones   . Gall stones   . Genital warts   . Hypertension   . Increased frequency of headaches   . Irritable bowel   . Kidney stones   . Major depressive disorder, recurrent severe without psychotic features (HCC)   . Migraines   . Urinary tract infection     Patient Active Problem List   Diagnosis Date Noted  . Pelvic pain in female 12/19/2016  . Condyloma acuminatum of vulva 10/22/2016  . MDD (major depressive disorder), recurrent severe, without psychosis (HCC) 11/01/2013  . 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 Borderline behavior 05/08/2012  . ADHD (attention deficit hyperactivity disorder) 05/08/2012  . 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 NO PAST SURGERIES       OB History    Gravida  0   Para  0   Term  0   Preterm  0   AB  0   Living  0     SAB  0   TAB  0   Ectopic  0   Multiple  0   Live Births               Home Medications    Prior to Admission medications   Medication Sig Start Date End Date Taking? Authorizing Provider  oxyCODONE-acetaminophen (PERCOCET) 7.5-325 MG tablet Take 1 tablet by mouth every 8 (eight) hours as needed for severe pain.   Yes [provider]  tiZANidine (ZANAFLEX) 4 MG capsule Take 4 mg by mouth 3 (three) times daily.   Yes [provider]  ALPRAZolam Prudy Feeler) 1 MG tablet Take 1 mg by mouth 3 (three) times daily as needed.     [provider]  buPROPion (WELLBUTRIN XL) 150 MG 24 hr tablet Take 150 mg by mouth daily.    [provider]  butalbital-acetaminophen-caffeine (FIORICET, ESGIC) 50-325-40 MG tablet Take 1-2 tablets by mouth every 6 (six) hours as needed for headache. 02/24/17 02/24/18  Myrna Blazer, MD  doxycycline (VIBRAMYCIN) 100 MG capsule  Take 1 capsule (100 mg total) by mouth 2 (two) times daily. 07/19/17   Holdan Stucke, Chase PicketJaime Pilcher, PA-C  traMADol (ULTRAM) 50 MG tablet Take 1 tablet (50 mg total) by mouth every 6 (six) hours as needed for moderate pain or severe pain. 12/19/16   Tilda BurrowFerguson, John V, MD    Family History Family History  Problem Relation Age of Onset  . Physical abuse Mother   . Anxiety disorder Mother   . ADD / ADHD Mother   . Paranoid behavior Mother   . Drug abuse Father   . Depression Father   . Alcohol abuse Maternal Grandfather   . ADD / ADHD Maternal Grandmother   . Anxiety disorder Maternal Grandmother   . Dementia Maternal Grandmother   . OCD Maternal Grandmother   . Sexual abuse Maternal Grandmother   . Alcohol abuse Paternal Grandfather   . Bipolar disorder Cousin   . Schizophrenia Neg Hx   . Seizures Neg Hx     Social History Social  History   Tobacco Use  . Smoking status: Current Every Day Smoker    Packs/day: 0.25    Types: Cigarettes  . Smokeless tobacco: Never Used  Substance Use Topics  . Alcohol use: No  . Drug use: No     Allergies   Sulfa antibiotics; Pseudoephedrine; Cephalosporins; Ciprocin-fluocin-procin [fluocinolone acetonide]; Compazine [prochlorperazine]; Flagyl [metronidazole]; Hydrochlorothiazide; Penicillins; and Toradol [ketorolac tromethamine]   Review of Systems Review of Systems  Genitourinary: Positive for vaginal bleeding and vaginal discharge. Negative for dysuria, frequency and urgency.  Musculoskeletal: Positive for back pain.  All other systems reviewed and are negative.    Physical Exam Updated Vital Signs BP (!) 160/106   Pulse 82   Temp 98 F (36.7 C) (Oral)   Resp 20   Ht 5\' 5"  (1.651 m)   Wt 97.5 kg (215 lb)   LMP 07/13/2017   SpO2 100%   BMI 35.78 kg/m   Physical Exam  Constitutional: She is oriented to person, place, and time. She appears well-developed and well-nourished. No distress.  HENT:  Head: Normocephalic and atraumatic.  Cardiovascular: Normal rate, regular rhythm and normal heart sounds.  No murmur heard. Pulmonary/Chest: Effort normal and breath sounds normal. No respiratory distress.  Abdominal: Soft. She exhibits no distension. There is no tenderness.  Genitourinary:  Genitourinary Comments: Chaperone present for exam. Small amount of blood in vaginal vault. + discharge. She did endorse both cervical motion and bilateral adnexal tenderness, however she began screaming in pain before speculum was inserted. Difficult to assess true CMT or adnexal tenderness.  Neurological: She is alert and oriented to person, place, and time.  Skin: Skin is warm and dry.  Nursing note and vitals reviewed.    ED Treatments / Results  Labs (all labs ordered are listed, but only abnormal results are displayed) Labs Reviewed  WET PREP, GENITAL - Abnormal;  Notable for the following components:      Result Value   Clue Cells Wet Prep HPF POC PRESENT (*)    WBC, Wet Prep HPF POC MANY (*)    All other components within normal limits  URINALYSIS, ROUTINE W REFLEX MICROSCOPIC - Abnormal; Notable for the following components:   APPearance CLOUDY (*)    Specific Gravity, Urine >1.030 (*)    Hgb urine dipstick LARGE (*)    Leukocytes, UA TRACE (*)    All other components within normal limits  CBC WITH DIFFERENTIAL/PLATELET - Abnormal; Notable for the following components:   WBC  13.1 (*)    Hemoglobin 15.1 (*)    Neutro Abs 8.4 (*)    Monocytes Absolute 1.5 (*)    All other components within normal limits  COMPREHENSIVE METABOLIC PANEL - Abnormal; Notable for the following components:   Total Protein 8.4 (*)    All other components within normal limits  URINALYSIS, MICROSCOPIC (REFLEX) - Abnormal; Notable for the following components:   Bacteria, UA FEW (*)    Squamous Epithelial / LPF 6-30 (*)    All other components within normal limits  PREGNANCY, URINE  GC/CHLAMYDIA PROBE AMP ([place]) NOT AT [facility]    EKG None  Radiology US Transvaginal Non-ob  Result Date: 07/19/2017 CLINICAL DATA:  Pelvic and back pain x1 month. LEEP procedure 1 week ago. EXAM: TRANSABDOMINAL AND TRANSVAGINAL ULTRASOUND OF PELVIS DOPPLER ULTRASOUND OF OVARIES TECHNIQUE: Both transabdominal and transvaginal ultrasound examinations of the pelvis were performed. Transabdominal technique was performed for global imaging of the pelvis including uterus, ovaries, adnexal regions, and pelvic cul-de-sac. It was necessary to proceed with endovaginal exam following the transabdominal exam to visualize the uterus, endometrium and ovaries. Color and duplex Doppler ultrasound was utilized to evaluate blood flow to the ovaries. COMPARISON:  CT from 07/10/2016 FINDINGS: Uterus Measurements: 7.2 x 3.2 x 3.8 cm and slightly anteverted. No fibroids or other mass visualized. Endometrium  Thickness: 4 mm.  No focal abnormality visualized. Right ovary Measurements: 2.6 x 2.1 x 1.8 cm. Normal appearance/no adnexal mass. Left ovary Measurements: 2.8 x 1.4 x 1.9 cm. Normal appearance/no adnexal mass. Pulsed Doppler evaluation of both ovaries demonstrates normal low-resistance arterial and venous waveforms. Other findings No abnormal free fluid. IMPRESSION: No ovarian torsion nor mass.  Unremarkable pelvic ultrasound. Electronically Signed   By: Tollie Eth M.D.   On: 07/19/2017 20:47   US Pelvis Complete  Result Date: 07/19/2017 CLINICAL DATA:  Pelvic and back pain x1 month. LEEP procedure 1 week ago. EXAM: TRANSABDOMINAL AND TRANSVAGINAL ULTRASOUND OF PELVIS DOPPLER ULTRASOUND OF OVARIES TECHNIQUE: Both transabdominal and transvaginal ultrasound examinations of the pelvis were performed. Transabdominal technique was performed for global imaging of the pelvis including uterus, ovaries, adnexal regions, and pelvic cul-de-sac. It was necessary to proceed with endovaginal exam following the transabdominal exam to visualize the uterus, endometrium and ovaries. Color and duplex Doppler ultrasound was utilized to evaluate blood flow to the ovaries. COMPARISON:  CT from 07/10/2016 FINDINGS: Uterus Measurements: 7.2 x 3.2 x 3.8 cm and slightly anteverted. No fibroids or other mass visualized. Endometrium Thickness: 4 mm.  No focal abnormality visualized. Right ovary Measurements: 2.6 x 2.1 x 1.8 cm. Normal appearance/no adnexal mass. Left ovary Measurements: 2.8 x 1.4 x 1.9 cm. Normal appearance/no adnexal mass. Pulsed Doppler evaluation of both ovaries demonstrates normal low-resistance arterial and venous waveforms. Other findings No abnormal free fluid. IMPRESSION: No ovarian torsion nor mass.  Unremarkable pelvic ultrasound. Electronically Signed   By: Tollie Eth M.D.   On: 07/19/2017 20:47   [place] Art/ven Flow Abd Pelv Doppler  Result Date: 07/19/2017 CLINICAL DATA:  Pelvic and back pain x1 month.  LEEP procedure 1 week ago. EXAM: TRANSABDOMINAL AND TRANSVAGINAL ULTRASOUND OF PELVIS DOPPLER ULTRASOUND OF OVARIES TECHNIQUE: Both transabdominal and transvaginal ultrasound examinations of the pelvis were performed. Transabdominal technique was performed for global imaging of the pelvis including uterus, ovaries, adnexal regions, and pelvic cul-de-sac. It was necessary to proceed with endovaginal exam following the transabdominal exam to visualize the uterus, endometrium and ovaries. Color and duplex Doppler ultrasound was utilized  to evaluate blood flow to the ovaries. COMPARISON:  CT from 07/10/2016 FINDINGS: Uterus Measurements: 7.2 x 3.2 x 3.8 cm and slightly anteverted. No fibroids or other mass visualized. Endometrium Thickness: 4 mm.  No focal abnormality visualized. Right ovary Measurements: 2.6 x 2.1 x 1.8 cm. Normal appearance/no adnexal mass. Left ovary Measurements: 2.8 x 1.4 x 1.9 cm. Normal appearance/no adnexal mass. Pulsed Doppler evaluation of both ovaries demonstrates normal low-resistance arterial and venous waveforms. Other findings No abnormal free fluid. IMPRESSION: No ovarian torsion nor mass.  Unremarkable pelvic ultrasound. Electronically Signed   By: Tollie Eth M.D.   On: 07/19/2017 20:47    Procedures Procedures (including critical care time)  Medications Ordered in ED Medications  ondansetron (ZOFRAN-ODT) disintegrating tablet 4 mg (4 mg Oral Given 07/19/17 1840)  HYDROcodone-acetaminophen (NORCO/VICODIN) 5-325 MG per tablet 1 tablet (1 tablet Oral Given 07/19/17 1906)  ibuprofen (ADVIL,MOTRIN) tablet 800 mg ( Oral Canceled Entry 07/19/17 2028)  azithromycin (ZITHROMAX) tablet 1,000 mg (1,000 mg Oral Given 07/19/17 2117)  cefTRIAXone (ROCEPHIN) injection 250 mg (250 mg Intramuscular Given 07/19/17 2117)  lidocaine (PF) (XYLOCAINE) 1 % injection (5 mLs  Given 07/19/17 2118)     Initial Impression / Assessment and Plan / ED Course  I have reviewed the triage vital signs and  the nursing notes.  Pertinent labs & imaging results that were available during my care of the patient were reviewed by me and considered in my medical decision making (see chart for details).    Carol [name] is a 32 y.o. female who presents to ED for pelvic pain for the last month which acutely worsened today.  She is also complaining of back pain.  She did have a recent LEEP procedure last week.  On exam, patient with no abdominal tenderness. She is afebrile, hemodynamically stable.  Pelvic examination performed.  She did have both cervical motion and adnexal tenderness, however she began screaming out in pain before speculum was inserted, limiting accuracy of my examination.  Proceeded with ultrasound for further evaluation that showed no acute findings.  Repeat abdominal exam again with tenderness.  Explained reassuring workup thus far.  Wet prep did reveal many WBCs.  Will treat for PID and have patient follow-up with OB/GYN.  Reasons to return to ER discussed and all questions answered.   Final Clinical Impressions(s) / ED Diagnoses   Final diagnoses:  PID (pelvic inflammatory disease)    ED Discharge Orders        Ordered    doxycycline (VIBRAMYCIN) 100 MG capsule  2 times daily     07/19/17 2134       Izak Anding, Chase Picket, PA-C 07/19/17 2138    Melene Plan, DO 07/19/17 2304

## 2017-07-19 NOTE — ED Notes (Signed)
Pt demanding pain medicine before having US done. PA consulted and ibuprofen ordered. Pt very upset that she was not being given IV/narcotic pain medication. Pt wants to know if she can see the doctor. Pt made aware that I would let both the PA and the MD aware of her request, but she needs to go have her US at this time. Pt took ibuprofen and allowed tech to take her to US.

## 2017-07-19 NOTE — Discharge Instructions (Signed)
Follow up with your doctor and OBGYN in regards to today's visit.   Please return to the ER for fevers, vomiting, new or worsening symptoms, any additional concerns.   You have been tested for chlamydia and gonorrhea. These results will be available in approximately 3 days. You will be notified if they are positive.

## 2017-07-19 NOTE — ED Triage Notes (Addendum)
Pelvic pain for a month. Here today for back pain. States she had a Leep Procedure last week.

## 2017-07-20 ENCOUNTER — Emergency Department: Payer: BLUE CROSS/BLUE SHIELD

## 2017-07-20 ENCOUNTER — Emergency Department
Admission: EM | Admit: 2017-07-20 | Discharge: 2017-07-20 | Disposition: A | Discharge status: Home / Self Care | Payer: BLUE CROSS/BLUE SHIELD | Attending: Emergency Medicine | Admitting: Emergency Medicine

## 2017-07-20 DIAGNOSIS — N23 Unspecified renal colic: Secondary | ICD-10-CM | POA: Insufficient documentation

## 2017-07-20 DIAGNOSIS — R079 Chest pain, unspecified: Secondary | ICD-10-CM | POA: Diagnosis not present

## 2017-07-20 DIAGNOSIS — F17210 Nicotine dependence, cigarettes, uncomplicated: Secondary | ICD-10-CM | POA: Diagnosis not present

## 2017-07-20 DIAGNOSIS — R300 Dysuria: Secondary | ICD-10-CM | POA: Insufficient documentation

## 2017-07-20 DIAGNOSIS — R1084 Generalized abdominal pain: Secondary | ICD-10-CM | POA: Diagnosis present

## 2017-07-20 DIAGNOSIS — I10 Essential (primary) hypertension: Secondary | ICD-10-CM | POA: Diagnosis not present

## 2017-07-20 DIAGNOSIS — J45909 Unspecified asthma, uncomplicated: Secondary | ICD-10-CM | POA: Diagnosis not present

## 2017-07-20 DIAGNOSIS — Z79899 Other long term (current) drug therapy: Secondary | ICD-10-CM | POA: Insufficient documentation

## 2017-07-20 LAB — URINALYSIS, COMPLETE (UACMP) WITH MICROSCOPIC
Bacteria, UA: NONE SEEN
SPECIFIC GRAVITY, URINE: 1.022 (ref 1.005–1.030)

## 2017-07-20 LAB — CBC WITH DIFFERENTIAL/PLATELET
BASOS PCT: 0 %
Basophils Absolute: 0 10*3/uL (ref 0–0.1)
Eosinophils Absolute: 0.1 10*3/uL (ref 0–0.7)
Eosinophils Relative: 1 %
HEMATOCRIT: 43.7 % (ref 35.0–47.0)
Hemoglobin: 14.7 g/dL (ref 12.0–16.0)
Lymphocytes Relative: 9 %
Lymphs Abs: 1.3 10*3/uL (ref 1.0–3.6)
MCH: 29.5 pg (ref 26.0–34.0)
MCHC: 33.6 g/dL (ref 32.0–36.0)
MCV: 88 fL (ref 80.0–100.0)
MONO ABS: 1.8 10*3/uL — AB (ref 0.2–0.9)
MONOS PCT: 13 %
Neutro Abs: 10.9 10*3/uL — ABNORMAL HIGH (ref 1.4–6.5)
Neutrophils Relative %: 77 %
Platelets: 347 10*3/uL (ref 150–440)
RBC: 4.97 MIL/uL (ref 3.80–5.20)
RDW: 12.8 % (ref 11.5–14.5)
WBC: 14.2 10*3/uL — ABNORMAL HIGH (ref 3.6–11.0)

## 2017-07-20 LAB — POCT PREGNANCY, URINE: PREG TEST UR: NEGATIVE

## 2017-07-20 LAB — COMPREHENSIVE METABOLIC PANEL
ALT: 23 U/L (ref 14–54)
ANION GAP: 10 (ref 5–15)
AST: 25 U/L (ref 15–41)
Albumin: 4.3 g/dL (ref 3.5–5.0)
Alkaline Phosphatase: 58 U/L (ref 38–126)
BILIRUBIN TOTAL: 0.8 mg/dL (ref 0.3–1.2)
BUN: 15 mg/dL (ref 6–20)
CO2: 21 mmol/L — ABNORMAL LOW (ref 22–32)
Calcium: 8.8 mg/dL — ABNORMAL LOW (ref 8.9–10.3)
Chloride: 108 mmol/L (ref 101–111)
Creatinine, Ser: 1.23 mg/dL — ABNORMAL HIGH (ref 0.44–1.00)
GFR calc Af Amer: >60 mL/min (ref >60)
GFR, EST NON AFRICAN AMERICAN: 59 mL/min — AB (ref >60)
Glucose, Bld: 122 mg/dL — ABNORMAL HIGH (ref 65–99)
Potassium: 4 mmol/L (ref 3.5–5.1)
Sodium: 139 mmol/L (ref 135–145)
TOTAL PROTEIN: 7.5 g/dL (ref 6.5–8.1)

## 2017-07-20 LAB — LIPASE, BLOOD: Lipase: 31 U/L (ref 11–51)

## 2017-07-20 LAB — TROPONIN I

## 2017-07-20 MED ORDER — SODIUM CHLORIDE 0.9 % IV BOLUS (SEPSIS)
1000.0000 mL | Freq: Once | INTRAVENOUS | Status: AC
  Administered 2017-07-20: 1000 mL via INTRAVENOUS

## 2017-07-20 MED ORDER — TAMSULOSIN HCL 0.4 MG PO CAPS: 0.4000 mg | ORAL_CAPSULE | Freq: Every day | ORAL | 0 refills | Status: DC

## 2017-07-20 MED ORDER — ONDANSETRON HCL 4 MG/2ML IJ SOLN
4.0000 mg | Freq: Once | INTRAMUSCULAR | Status: AC
  Administered 2017-07-20: 4 mg via INTRAVENOUS

## 2017-07-20 MED ORDER — NITROFURANTOIN MACROCRYSTAL 100 MG PO CAPS: 100.0000 mg | ORAL_CAPSULE | Freq: Four times a day (QID) | ORAL | 0 refills | Status: AC

## 2017-07-20 MED ORDER — OXYCODONE-ACETAMINOPHEN 5-325 MG PO TABS: 1.0000 | ORAL_TABLET | Freq: Four times a day (QID) | ORAL | 0 refills | Status: DC | PRN

## 2017-07-20 MED ORDER — KETOROLAC TROMETHAMINE 30 MG/ML IJ SOLN
30.0000 mg | Freq: Once | INTRAMUSCULAR | Status: AC
  Administered 2017-07-20: 30 mg via INTRAVENOUS
  Filled 2017-07-20: qty 1

## 2017-07-20 MED ORDER — ONDANSETRON HCL 4 MG/2ML IJ SOLN
INTRAMUSCULAR | Status: AC
  Filled 2017-07-20: qty 2

## 2017-07-20 MED ORDER — HYDROMORPHONE HCL 1 MG/ML IJ SOLN
0.5000 mg | Freq: Once | INTRAMUSCULAR | Status: AC
  Administered 2017-07-20: 0.5 mg via INTRAVENOUS
  Filled 2017-07-20: qty 1

## 2017-07-20 NOTE — ED Provider Notes (Addendum)
[facility] Emergency Department Provider Note   ____________________________________________   First MD Initiated Contact with Patient 07/20/17 [phone]     (approximate)  I have reviewed the triage vital signs and the nursing notes.   HISTORY  Chief Complaint Flank Pain and Chest Pain    HPI Carol [name] is a 32 y.o. female Who complains of left flank pain last several days it radiates into her belly and groin and also up into the lower chest on the left side. Pain is worse with breathing. It's worse with movement or palpation of the abdomen. She was nauseated earlier. The pain is severe sharp and crampy in nature and she has dysuria and urgency and frequency with it. She is not running a fever. She has had kidney stones and this reminds her of it but it's worse when she's ever had.she was seen yesterday and had a pelvic ultrasound was negative. She had hematuria yesterday but not today.patient reports she had a pelvic exam yesterday was no tenderness then.   Past Medical History:  Diagnosis Date  . ADHD   . Anxiety   . Asthma   . Bronchitis   . CNS disorder   . Depression   . Drug-seeking behavior   . Fatty liver disease, nonalcoholic   . Gall stones   . Gall stones   . Genital warts   . Hypertension   . Increased frequency of headaches   . Irritable bowel   . Kidney stones   . Major depressive disorder, recurrent severe without psychotic features (HCC)   . Migraines   . Urinary tract infection     Patient Active Problem List   Diagnosis Date Noted  . Pelvic pain in female 12/19/2016  . Condyloma acuminatum of vulva 10/22/2016  . MDD (major depressive disorder), recurrent severe, without psychosis (HCC) 11/01/2013  . 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 Borderline behavior 05/08/2012  . ADHD (attention deficit hyperactivity disorder) 05/08/2012  . Insomnia secondary to depression with anxiety 05/08/2012    Past Surgical History:  Procedure  Laterality Date  . NO PAST SURGERIES      Prior to Admission medications   Medication Sig Start Date End Date Taking? Authorizing Provider  ALPRAZolam Prudy Feeler) 1 MG tablet Take 1 mg by mouth 3 (three) times daily as needed.     [provider]  buPROPion (WELLBUTRIN XL) 150 MG 24 hr tablet Take 150 mg by mouth daily.    [provider]  doxycycline (VIBRAMYCIN) 100 MG capsule Take 1 capsule (100 mg total) by mouth 2 (two) times daily. 07/19/17   Ward, Chase Picket, PA-C  nitrofurantoin (MACRODANTIN) 100 MG capsule Take 1 capsule (100 mg total) by mouth 4 (four) times daily for 7 days. 07/20/17 07/27/17  Arnaldo Natal, MD  oxyCODONE-acetaminophen (PERCOCET/ROXICET) 5-325 MG tablet Take 1 tablet by mouth every 6 (six) hours as needed for severe pain. 07/20/17   Arnaldo Natal, MD  tamsulosin (FLOMAX) 0.4 MG CAPS capsule Take 1 capsule (0.4 mg total) by mouth daily. 07/20/17   Arnaldo Natal, MD  tiZANidine (ZANAFLEX) 4 MG capsule Take 4 mg by mouth 3 (three) times daily.    [provider]    Allergies Sulfa antibiotics; Pseudoephedrine; Cephalosporins; Ciprocin-fluocin-procin [fluocinolone acetonide]; Compazine [prochlorperazine]; Flagyl [metronidazole]; Hydrochlorothiazide; and Penicillins  Family History  Problem Relation Age of Onset  . Physical abuse Mother   . Anxiety disorder Mother   . ADD / ADHD Mother   . Paranoid  behavior Mother   . Drug abuse Father   . Depression Father   . Alcohol abuse Maternal Grandfather   . ADD / ADHD Maternal Grandmother   . Anxiety disorder Maternal Grandmother   . Dementia Maternal Grandmother   . OCD Maternal Grandmother   . Sexual abuse Maternal Grandmother   . Alcohol abuse Paternal Grandfather   . Bipolar disorder Cousin   . Schizophrenia Neg Hx   . Seizures Neg Hx     Social History Social History   Tobacco Use  . Smoking status: Current Every Day Smoker    Packs/day: 0.25    Types: Cigarettes  . Smokeless  tobacco: Never Used  Substance Use Topics  . Alcohol use: No  . Drug use: No    Review of Systems  Constitutional: No fever/chills Eyes: No visual changes. ENT: No sore throat. Cardiovascular:chest pain see history of present illness Respiratory: Denies shortness of breath. Gastrointestinal: see history of present illness. Genitourinary:  dysuria. Musculoskeletal: Negative for back pain. Skin: Negative for rash. Neurological: Negative for headaches, focal weakness  ____________________________________________   PHYSICAL EXAM:  VITAL SIGNS: ED Triage Vitals  Enc Vitals Group     BP 07/20/17 0548 (!) 150/105     Pulse Rate 07/20/17 0548 92     Resp 07/20/17 0548 17     Temp 07/20/17 0548 98.7 F (37.1 C)     Temp Source 07/20/17 0548 Oral     SpO2 07/20/17 0548 98 %     Weight 07/20/17 0559 215 lb (97.5 kg)     Height --      Head Circumference --      Peak Flow --      Pain Score 07/20/17 0559 10     Pain Loc --      Pain Edu? --      Excl. in GC? --     Constitutional: Alert and oriented. looks uncomfortable Eyes: Conjunctivae are normal.  Head: Atraumatic. Nose: No congestion/rhinnorhea. Mouth/Throat: Mucous membranes are moist.  Oropharynx non-erythematous. Neck: No stridor.   Cardiovascular: Normal rate, regular rhythm. Grossly normal heart sounds.  Good peripheral circulation. Respiratory: Normal respiratory effort.  No retractions. Lungs CTAB. Gastrointestinal: Soft tender to palpation left lower quadrant. No distention. No abdominal bruits. left CVA tenderness. Musculoskeletal: No lower extremity tenderness nor edema.  No joint effusions. Neurologic:  Normal speech and language. No gross focal neurologic deficits are appreciated. No gait instability. Skin:  Skin is warm, dry and intact. No rash noted. Psychiatric: Mood and affect are normal. Speech and behavior are normal.  ____________________________________________   LABS (all labs ordered are  listed, but only abnormal results are displayed)  Labs Reviewed  CBC WITH DIFFERENTIAL/PLATELET - Abnormal; Notable for the following components:      Result Value   WBC 14.2 (*)    Neutro Abs 10.9 (*)    Monocytes Absolute 1.8 (*)    All other components within normal limits  COMPREHENSIVE METABOLIC PANEL - Abnormal; Notable for the following components:   CO2 21 (*)    Glucose, Bld 122 (*)    Creatinine, Ser 1.23 (*)    Calcium 8.8 (*)    GFR calc non Af Amer 59 (*)    All other components within normal limits  URINALYSIS, COMPLETE (UACMP) WITH MICROSCOPIC - Abnormal; Notable for the following components:   Color, Urine ORANGE (*)    Glucose, UA   (*)    Value: TEST NOT REPORTED DUE TO COLOR  INTERFERENCE OF URINE PIGMENT   Hgb urine dipstick   (*)    Value: TEST NOT REPORTED DUE TO COLOR INTERFERENCE OF URINE PIGMENT   Bilirubin Urine   (*)    Value: TEST NOT REPORTED DUE TO COLOR INTERFERENCE OF URINE PIGMENT   Ketones, ur   (*)    Value: TEST NOT REPORTED DUE TO COLOR INTERFERENCE OF URINE PIGMENT   Protein, ur   (*)    Value: TEST NOT REPORTED DUE TO COLOR INTERFERENCE OF URINE PIGMENT   Nitrite   (*)    Value: TEST NOT REPORTED DUE TO COLOR INTERFERENCE OF URINE PIGMENT   Leukocytes, UA   (*)    Value: TEST NOT REPORTED DUE TO COLOR INTERFERENCE OF URINE PIGMENT   Squamous Epithelial / LPF 0-5 (*)    All other components within normal limits  TROPONIN I  LIPASE, BLOOD  POCT PREGNANCY, URINE  POC URINE PREG, ED   ____________________________________________  EKG  EKG is interpreted in the addendum immediately after this ____________________________________________  RADIOLOGY  ED MD interpretation: CT shows a 5 mm renal stone at the left UVJ I reviewed the films  Official radiology report(s): Dg Chest 2 View  Result Date: 07/20/2017 CLINICAL DATA:  Chest pain. EXAM: CHEST - 2 VIEW COMPARISON:  02/20/2017 FINDINGS: The cardiomediastinal contours are normal. The  lungs are clear. Pulmonary vasculature is normal. No consolidation, pleural effusion, or pneumothorax. No acute osseous abnormalities are seen. IMPRESSION: Unremarkable radiographs of the chest. Electronically Signed   By: Rubye Oaks M.D.   On: 07/20/2017 06:52   US Transvaginal Non-ob  Result Date: 07/19/2017 CLINICAL DATA:  Pelvic and back pain x1 month. LEEP procedure 1 week ago. EXAM: TRANSABDOMINAL AND TRANSVAGINAL ULTRASOUND OF PELVIS DOPPLER ULTRASOUND OF OVARIES TECHNIQUE: Both transabdominal and transvaginal ultrasound examinations of the pelvis were performed. Transabdominal technique was performed for global imaging of the pelvis including uterus, ovaries, adnexal regions, and pelvic cul-de-sac. It was necessary to proceed with endovaginal exam following the transabdominal exam to visualize the uterus, endometrium and ovaries. Color and duplex Doppler ultrasound was utilized to evaluate blood flow to the ovaries. COMPARISON:  CT from 07/10/2016 FINDINGS: Uterus Measurements: 7.2 x 3.2 x 3.8 cm and slightly anteverted. No fibroids or other mass visualized. Endometrium Thickness: 4 mm.  No focal abnormality visualized. Right ovary Measurements: 2.6 x 2.1 x 1.8 cm. Normal appearance/no adnexal mass. Left ovary Measurements: 2.8 x 1.4 x 1.9 cm. Normal appearance/no adnexal mass. Pulsed Doppler evaluation of both ovaries demonstrates normal low-resistance arterial and venous waveforms. Other findings No abnormal free fluid. IMPRESSION: No ovarian torsion nor mass.  Unremarkable pelvic ultrasound. Electronically Signed   By: Tollie Eth M.D.   On: 07/19/2017 20:47   US Pelvis Complete  Result Date: 07/19/2017 CLINICAL DATA:  Pelvic and back pain x1 month. LEEP procedure 1 week ago. EXAM: TRANSABDOMINAL AND TRANSVAGINAL ULTRASOUND OF PELVIS DOPPLER ULTRASOUND OF OVARIES TECHNIQUE: Both transabdominal and transvaginal ultrasound examinations of the pelvis were performed. Transabdominal technique was  performed for global imaging of the pelvis including uterus, ovaries, adnexal regions, and pelvic cul-de-sac. It was necessary to proceed with endovaginal exam following the transabdominal exam to visualize the uterus, endometrium and ovaries. Color and duplex Doppler ultrasound was utilized to evaluate blood flow to the ovaries. COMPARISON:  CT from 07/10/2016 FINDINGS: Uterus Measurements: 7.2 x 3.2 x 3.8 cm and slightly anteverted. No fibroids or other mass visualized. Endometrium Thickness: 4 mm.  No focal abnormality visualized. Right  ovary Measurements: 2.6 x 2.1 x 1.8 cm. Normal appearance/no adnexal mass. Left ovary Measurements: 2.8 x 1.4 x 1.9 cm. Normal appearance/no adnexal mass. Pulsed Doppler evaluation of both ovaries demonstrates normal low-resistance arterial and venous waveforms. Other findings No abnormal free fluid. IMPRESSION: No ovarian torsion nor mass.  Unremarkable pelvic ultrasound. Electronically Signed   By: Tollie Eth M.D.   On: 07/19/2017 20:47   [place] Art/ven Flow Abd Pelv Doppler  Result Date: 07/19/2017 CLINICAL DATA:  Pelvic and back pain x1 month. LEEP procedure 1 week ago. EXAM: TRANSABDOMINAL AND TRANSVAGINAL ULTRASOUND OF PELVIS DOPPLER ULTRASOUND OF OVARIES TECHNIQUE: Both transabdominal and transvaginal ultrasound examinations of the pelvis were performed. Transabdominal technique was performed for global imaging of the pelvis including uterus, ovaries, adnexal regions, and pelvic cul-de-sac. It was necessary to proceed with endovaginal exam following the transabdominal exam to visualize the uterus, endometrium and ovaries. Color and duplex Doppler ultrasound was utilized to evaluate blood flow to the ovaries. COMPARISON:  CT from 07/10/2016 FINDINGS: Uterus Measurements: 7.2 x 3.2 x 3.8 cm and slightly anteverted. No fibroids or other mass visualized. Endometrium Thickness: 4 mm.  No focal abnormality visualized. Right ovary Measurements: 2.6 x 2.1 x 1.8 cm. Normal  appearance/no adnexal mass. Left ovary Measurements: 2.8 x 1.4 x 1.9 cm. Normal appearance/no adnexal mass. Pulsed Doppler evaluation of both ovaries demonstrates normal low-resistance arterial and venous waveforms. Other findings No abnormal free fluid. IMPRESSION: No ovarian torsion nor mass.  Unremarkable pelvic ultrasound. Electronically Signed   By: Tollie Eth M.D.   On: 07/19/2017 20:47   Ct Renal Stone Study  Result Date: 07/20/2017 CLINICAL DATA:  Left flank pain. EXAM: CT ABDOMEN AND PELVIS WITHOUT CONTRAST TECHNIQUE: Multidetector CT imaging of the abdomen and pelvis was performed following the standard protocol without IV contrast. COMPARISON:  CT abdomen pelvis dated July 10, 2016. FINDINGS: Lower chest: No acute abnormality. Hepatobiliary: Diffuse hepatic steatosis. Cholelithiasis. No gallbladder wall thickening or biliary dilatation. Pancreas: Unremarkable. No pancreatic ductal dilatation or surrounding inflammatory changes. Spleen: Normal in size without focal abnormality. Adrenals/Urinary Tract: The adrenal glands and right kidney are unremarkable. There is a 5 mm calculus at the left UVJ with resultant mild left hydroureteronephrosis and left perinephric fat stranding. Additional punctate nonobstructive calculus in the upper pole of the left kidney. The bladder is decompressed. Stomach/Bowel: Stomach is within normal limits. Appendix appears normal. No evidence of bowel wall thickening, distention, or inflammatory changes. Mild sigmoid diverticulosis. Vascular/Lymphatic: No significant vascular findings are present. No enlarged abdominal or pelvic lymph nodes. Reproductive: Uterus and bilateral adnexa are unremarkable. Other: Tiny fat containing umbilical hernia. No pneumoperitoneum or free fluid. Musculoskeletal: No acute or significant osseous findings. IMPRESSION: 1. 5 mm calculus at the left distal UVJ with resultant mild left hydroureteronephrosis. 2. Additional punctate nonobstructive  left nephrolithiasis. 3. Hepatic steatosis. 4. Cholelithiasis. Electronically Signed   By: Obie Dredge M.D.   On: 07/20/2017 08:51    ____________________________________________   PROCEDURES  Procedure(s) performed:   Procedures  Critical Care performed:   ____________________________________________   INITIAL IMPRESSION / ASSESSMENT AND PLAN / ED COURSE  patient has a renal stone. She is having renal colic. I discussed the patient with Dr. Thea Silversmith on call for urology. She is not running a fever she is not having vomiting. He thinks it is possible that the white blood cells may be from irritation from the stone as well as the white blood count being from the pain. I have warned her to  return here at once if she develops a fever. Dr. Thea Silversmith suggested trying back 100 mg 4 times a day. She is allergic to sulfa penicillins and cephalosporins and Cipro.       ____________________________________________   FINAL CLINICAL IMPRESSION(S) / ED DIAGNOSES  Final diagnoses:  Renal colic on left side     ED Discharge Orders        Ordered    nitrofurantoin (MACRODANTIN) 100 MG capsule  4 times daily     07/20/17 0919    tamsulosin (FLOMAX) 0.4 MG CAPS capsule  Daily     07/20/17 0921    oxyCODONE-acetaminophen (PERCOCET/ROXICET) 5-325 MG tablet  Every 6 hours PRN     07/20/17 1610       Note:  This document was prepared using Dragon voice recognition software and may include unintentional dictation errors.    Arnaldo Natal, MD 07/20/17 9604    Arnaldo Natal, MD 08/06/17 1137

## 2017-07-20 NOTE — Discharge Instructions (Addendum)
Call the urologist on Monday. Have her see you. Try to strain all urine and catch the stone and take it with you. She may be able to have it analyzed and perhaps we can prevention from getting more stones. Take the nitrofurantoin 1 pill 4 times a day. That may help prevent any infection. Return here for worse pain, fever or vomiting.use the Percocet 1 pill 4 times a day as needed for pain. If the pain is bad you can take 2 pills 4 times a day. If that doesn't help return here. Drink plenty of fluids.

## 2017-07-20 NOTE — ED Triage Notes (Signed)
Patient c/o left flank pain beginning yesterday, radiating to pelvis, chest, and shoulders. Patient c/o nausea, decreased urination, difficulty voiding.

## 2017-07-20 NOTE — ED Provider Notes (Addendum)
we will not use Flomax on this patient as she had a severe allergy to sulfa and up-to-date recommends not using Flomax and these people.   [name], Carol [name] F, MD 07/20/17 0935 EKG read and interpreted by me shows normal sinus rhythm rate of 87 normal axis no acute ST-T changes   [name], [name], MD 08/06/17 1136

## 2017-07-22 LAB — GC/CHLAMYDIA PROBE AMP (~~LOC~~) NOT AT ARMC
CHLAMYDIA, DNA PROBE: NEGATIVE
Neisseria Gonorrhea: NEGATIVE

## 2017-07-24 ENCOUNTER — Ambulatory Visit (INDEPENDENT_AMBULATORY_CARE_PROVIDER_SITE_OTHER): Payer: BLUE CROSS/BLUE SHIELD | Admitting: Urology

## 2017-07-24 ENCOUNTER — Encounter: Payer: Self-pay | Admitting: Radiology

## 2017-07-24 ENCOUNTER — Ambulatory Visit
Admission: RE | Admit: 2017-07-24 | Discharge: 2017-07-24 | Disposition: A | Discharge status: Home / Self Care | Payer: BLUE CROSS/BLUE SHIELD | Source: Ambulatory Visit | Attending: Urology | Admitting: Urology

## 2017-07-24 ENCOUNTER — Encounter: Payer: Self-pay | Admitting: Urology

## 2017-07-24 ENCOUNTER — Other Ambulatory Visit: Payer: Self-pay | Admitting: Radiology

## 2017-07-24 VITALS — BP 122/83 | HR 89 | Ht 65.0 in | Wt 215.0 lb

## 2017-07-24 DIAGNOSIS — N201 Calculus of ureter: Secondary | ICD-10-CM | POA: Diagnosis present

## 2017-07-24 DIAGNOSIS — N200 Calculus of kidney: Secondary | ICD-10-CM | POA: Diagnosis not present

## 2017-07-24 LAB — URINALYSIS, COMPLETE
BILIRUBIN UA: NEGATIVE
Glucose, UA: NEGATIVE
Ketones, UA: NEGATIVE
NITRITE UA: NEGATIVE
PH UA: 5.5 (ref 5.0–7.5)
Protein, UA: NEGATIVE
RBC UA: NEGATIVE
Specific Gravity, UA: >1.03 — ABNORMAL HIGH (ref 1.005–1.030)
Urobilinogen, Ur: 0.2 mg/dL (ref 0.2–1.0)

## 2017-07-24 LAB — BLADDER SCAN AMB NON-IMAGING

## 2017-07-24 LAB — MICROSCOPIC EXAMINATION: RBC MICROSCOPIC, UA: NONE SEEN /HPF (ref 0–2)

## 2017-07-24 MED ORDER — TAMSULOSIN HCL 0.4 MG PO CAPS: 0.4000 mg | ORAL_CAPSULE | Freq: Every day | ORAL | 0 refills | Status: DC

## 2017-07-24 MED ORDER — OXYCODONE-ACETAMINOPHEN 5-325 MG PO TABS: 1.0000 | ORAL_TABLET | Freq: Four times a day (QID) | ORAL | 0 refills | Status: DC | PRN

## 2017-07-24 NOTE — Progress Notes (Signed)
07/24/2017 4:09 PM   Carol [name] 07-Oct-1985 [id number]  Referring provider: Patrick Jupiterox, Erica A, NP [address] [place], [place] [id number]  Chief Complaint  Patient presents with  . Nephrolithiasis    New Patient    HPI: Carol DakinBrittany [name] is a 32 year old female who presented to the [facility] ED on 07/20/2017 with a several day history of left flank pain radiating to the left groin region.  Her pain was described as intermittent and severe.  She has a prior history of stones and her pain was similar to previous bouts of renal colic.  She was seen in the ED on 07/20/2017 and a stone protocol CT of the abdomen and pelvis was performed which showed a 5 mm left UVJ calculus with mild left hydronephrosis and hydroureter.  She was incidentally noted to have cholelithiasis and punctate nonobstructing left renal calculi.  She states she is always passed previous calculi and has never required intervention.  Since her ED visit her flank pain has improved however she is having urinary frequency and urgency with voiding small amounts.  She is not taking tamsulosin.   PMH: Past Medical History:  Diagnosis Date  . ADHD   . Anxiety   . Asthma   . Bronchitis   . CNS disorder   . Depression   . Drug-seeking behavior   . Fatty liver disease, nonalcoholic   . Gall stones   . Gall stones   . Genital warts   . Hypertension   . Increased frequency of headaches   . Irritable bowel   . Kidney stones   . Major depressive disorder, recurrent severe without psychotic features (HCC)   . Migraines   . Urinary tract infection     Surgical History: Past Surgical History:  Procedure Laterality Date  . NO PAST SURGERIES      Home Medications:  Allergies as of 07/24/2017      Reactions   Sulfa Antibiotics Hives, Shortness Of Breath   Pseudoephedrine Palpitations, Other (See Comments)   sweats   Cephalosporins    hives   Ciprocin-fluocin-procin [fluocinolone Acetonide] Hives   Compazine  [prochlorperazine]    Flagyl [metronidazole] Other (See Comments)   Causes a greenish purple fuzz on tongue   Hydrochlorothiazide    Sweaty, faint feeling   Penicillins Hives   Has patient had a PCN reaction causing immediate rash, facial/tongue/throat swelling, SOB or lightheadedness with hypotension: Yes Has patient had a PCN reaction causing severe rash involving mucus membranes or skin necrosis: No Has patient had a PCN reaction that required hospitalization Yes Has patient had a PCN reaction occurring within the last 10 years: No If all of the above answers are "NO", then may proceed with Cephalosporin use.      Medication List        Accurate as of 07/24/17  4:09 PM. Always use your most recent med list.          ALPRAZolam 1 MG tablet Commonly known as:  XANAX Take 1 mg by mouth 3 (three) times daily as needed.   buPROPion 150 MG 24 hr tablet Commonly known as:  WELLBUTRIN XL Take 150 mg by mouth daily.   doxycycline 100 MG capsule Commonly known as:  VIBRAMYCIN Take 1 capsule (100 mg total) by mouth 2 (two) times daily.   nitrofurantoin 100 MG capsule Commonly known as:  MACRODANTIN Take 1 capsule (100 mg total) by mouth 4 (four) times daily for 7 days.   oxyCODONE-acetaminophen 5-325  MG tablet Commonly known as:  PERCOCET/ROXICET Take 1 tablet by mouth every 6 (six) hours as needed for severe pain.   tamsulosin 0.4 MG Caps capsule Commonly known as:  FLOMAX Take 1 capsule (0.4 mg total) by mouth daily.   tiZANidine 4 MG capsule Commonly known as:  ZANAFLEX Take 4 mg by mouth 3 (three) times daily.   VIMOVO 500-20 MG Tbec Generic drug:  Naproxen-Esomeprazole TAKE ONE TABLET BY MOUTH TWICE DAILY BEFORE MEALS       Allergies:  Allergies  Allergen Reactions  . Sulfa Antibiotics Hives and Shortness Of Breath  . Pseudoephedrine Palpitations and Other (See Comments)    sweats  . Cephalosporins     hives  . Ciprocin-Fluocin-Procin [Fluocinolone  Acetonide] Hives  . Compazine [Prochlorperazine]   . Flagyl [Metronidazole] Other (See Comments)    Causes a greenish purple fuzz on tongue  . Hydrochlorothiazide     Sweaty, faint feeling  . Penicillins Hives    Has patient had a PCN reaction causing immediate rash, facial/tongue/throat swelling, SOB or lightheadedness with hypotension: Yes Has patient had a PCN reaction causing severe rash involving mucus membranes or skin necrosis: No Has patient had a PCN reaction that required hospitalization Yes Has patient had a PCN reaction occurring within the last 10 years: No If all of the above answers are "NO", then may proceed with Cephalosporin use.    Family History: Family History  Problem Relation Age of Onset  . Physical abuse Mother   . Anxiety disorder Mother   . ADD / ADHD Mother   . Paranoid behavior Mother   . Drug abuse Father   . Depression Father   . Alcohol abuse Maternal Grandfather   . ADD / ADHD Maternal Grandmother   . Anxiety disorder Maternal Grandmother   . Dementia Maternal Grandmother   . OCD Maternal Grandmother   . Sexual abuse Maternal Grandmother   . Alcohol abuse Paternal Grandfather   . Bipolar disorder Cousin   . Schizophrenia Neg Hx   . Seizures Neg Hx     Social History:  reports that she has been smoking cigarettes.  She has been smoking about 0.25 packs per day. She has never used smokeless tobacco. She reports that she does not drink alcohol or use drugs.  ROS: UROLOGY Frequent Urination?: Yes Hard to postpone urination?: Yes Burning/pain with urination?: Yes Get up at night to urinate?: Yes Leakage of urine?: Yes Urine stream starts and stops?: Yes Trouble starting stream?: Yes Do you have to strain to urinate?: No Blood in urine?: Yes Urinary tract infection?: No Sexually transmitted disease?: No Injury to kidneys or bladder?: No Painful intercourse?: Yes Weak stream?: Yes Currently pregnant?: No Vaginal bleeding?: No Last  menstrual period?: n  Gastrointestinal Nausea?: Yes Vomiting?: Yes Indigestion/heartburn?: Yes Diarrhea?: No Constipation?: Yes  Constitutional Fever: No Night sweats?: Yes Weight loss?: No Fatigue?: Yes  Skin Skin rash/lesions?: No Itching?: Yes  Eyes Blurred vision?: Yes Double vision?: No  Ears/Nose/Throat Sore throat?: No Sinus problems?: Yes  Hematologic/Lymphatic Swollen glands?: No Easy bruising?: No  Cardiovascular Leg swelling?: No Chest pain?: No  Respiratory Cough?: No Shortness of breath?: No  Endocrine Excessive thirst?: No  Musculoskeletal Back pain?: Yes Joint pain?: Yes  Neurological Headaches?: Yes Dizziness?: No  Psychologic Depression?: No Anxiety?: Yes  Physical Exam: BP 122/83   Pulse 89   Ht 5\' 5"  (1.651 m)   Wt 215 lb (97.5 kg)   LMP 07/13/2017 Comment: neg preg  BMI  35.78 kg/m   Constitutional:  Alert and oriented, No acute distress. HEENT: [place] AT, moist mucus membranes.  Trachea midline, no masses. Cardiovascular: No clubbing, cyanosis, or edema.  RRR Respiratory: Normal respiratory effort, no increased work of breathing.  Lungs clear GI: Abdomen is soft, nontender, nondistended, no abdominal masses GU: No CVA tenderness Lymph: No cervical or inguinal lymphadenopathy. Skin: No rashes, bruises or suspicious lesions. Neurologic: Grossly intact, no focal deficits, moving all 4 extremities. Psychiatric: Normal mood and affect.  Laboratory Data: Lab Results  Component Value Date   WBC 14.2 (H) 07/20/2017   HGB 14.7 07/20/2017   HCT 43.7 07/20/2017   MCV 88.0 07/20/2017   PLT 347 07/20/2017    Lab Results  Component Value Date   CREATININE 1.23 (H) 07/20/2017    Urinalysis Dipstick 1+ leukocytes Microscopy 6-10 WBC, moderate bacteria  Imaging: CT was personally reviewed   Assessment & Plan:   32 year old female with a 5 mm left distal ureteral calculus.  We discussed options of a trial of passage,  ureteroscopic removal and shockwave lithotripsy.  Based on stone size and location she was informed ureteroscopy would have the high success rate.  She would like to go ahead and schedule ureteroscopy.  Will add tamsulosin and her oxycodone was refilled.  A urine culture was ordered   The indications and nature of the planned procedure were discussed as well as the potential  benefits and expected outcome.  Alternatives have been discussed in detail. The most common complications and side effects were discussed including but not limited to infection/sepsis; blood loss; damage to urethra, bladder, ureter; need for multiple surgeries; need for prolonged stent placement as well as general anesthesia risks.  All of her questions were answered and she desires to proceed.  At the time of our visit he she did not appear to be distracted or in pain.     Riki Altes, MD  [facility] [address] [place], [place] [address] [phone]

## 2017-07-24 NOTE — H&P (View-Only) (Signed)
 07/24/2017 4:09 PM   Carol [name] 11/09/1985 [id number]  Referring provider: Cox, Erica A, NP [address] [place], [place] [address]  Chief Complaint  Patient presents with  . Nephrolithiasis    New Patient    HPI: Carol [name] is a 31-year-old female who presented to the [facility] ED on 07/20/2017 with a several day history of left flank pain radiating to the left groin region.  Her pain was described as intermittent and severe.  She has a prior history of stones and her pain was similar to previous bouts of renal colic.  She was seen in the ED on 07/20/2017 and a stone protocol CT of the abdomen and pelvis was performed which showed a 5 mm left UVJ calculus with mild left hydronephrosis and hydroureter.  She was incidentally noted to have cholelithiasis and punctate nonobstructing left renal calculi.  She states she is always passed previous calculi and has never required intervention.  Since her ED visit her flank pain has improved however she is having urinary frequency and urgency with voiding small amounts.  She is not taking tamsulosin.   PMH: Past Medical History:  Diagnosis Date  . ADHD   . Anxiety   . Asthma   . Bronchitis   . CNS disorder   . Depression   . Drug-seeking behavior   . Fatty liver disease, nonalcoholic   . Gall stones   . Gall stones   . Genital warts   . Hypertension   . Increased frequency of headaches   . Irritable bowel   . Kidney stones   . Major depressive disorder, recurrent severe without psychotic features (HCC)   . Migraines   . Urinary tract infection     Surgical History: Past Surgical History:  Procedure Laterality Date  . NO PAST SURGERIES      Home Medications:  Allergies as of 07/24/2017      Reactions   Sulfa Antibiotics Hives, Shortness Of Breath   Pseudoephedrine Palpitations, Other (See Comments)   sweats   Cephalosporins    hives   Ciprocin-fluocin-procin [fluocinolone Acetonide] Hives   Compazine  [prochlorperazine]    Flagyl [metronidazole] Other (See Comments)   Causes a greenish purple fuzz on tongue   Hydrochlorothiazide    Sweaty, faint feeling   Penicillins Hives   Has patient had a PCN reaction causing immediate rash, facial/tongue/throat swelling, SOB or lightheadedness with hypotension: Yes Has patient had a PCN reaction causing severe rash involving mucus membranes or skin necrosis: No Has patient had a PCN reaction that required hospitalization Yes Has patient had a PCN reaction occurring within the last 10 years: No If all of the above answers are "NO", then may proceed with Cephalosporin use.      Medication List        Accurate as of 07/24/17  4:09 PM. Always use your most recent med list.          ALPRAZolam 1 MG tablet Commonly known as:  XANAX Take 1 mg by mouth 3 (three) times daily as needed.   buPROPion 150 MG 24 hr tablet Commonly known as:  WELLBUTRIN XL Take 150 mg by mouth daily.   doxycycline 100 MG capsule Commonly known as:  VIBRAMYCIN Take 1 capsule (100 mg total) by mouth 2 (two) times daily.   nitrofurantoin 100 MG capsule Commonly known as:  MACRODANTIN Take 1 capsule (100 mg total) by mouth 4 (four) times daily for 7 days.   oxyCODONE-acetaminophen 5-325   MG tablet Commonly known as:  PERCOCET/ROXICET Take 1 tablet by mouth every 6 (six) hours as needed for severe pain.   tamsulosin 0.4 MG Caps capsule Commonly known as:  FLOMAX Take 1 capsule (0.4 mg total) by mouth daily.   tiZANidine 4 MG capsule Commonly known as:  ZANAFLEX Take 4 mg by mouth 3 (three) times daily.   VIMOVO 500-20 MG Tbec Generic drug:  Naproxen-Esomeprazole TAKE ONE TABLET BY MOUTH TWICE DAILY BEFORE MEALS       Allergies:  Allergies  Allergen Reactions  . Sulfa Antibiotics Hives and Shortness Of Breath  . Pseudoephedrine Palpitations and Other (See Comments)    sweats  . Cephalosporins     hives  . Ciprocin-Fluocin-Procin [Fluocinolone  Acetonide] Hives  . Compazine [Prochlorperazine]   . Flagyl [Metronidazole] Other (See Comments)    Causes a greenish purple fuzz on tongue  . Hydrochlorothiazide     Sweaty, faint feeling  . Penicillins Hives    Has patient had a PCN reaction causing immediate rash, facial/tongue/throat swelling, SOB or lightheadedness with hypotension: Yes Has patient had a PCN reaction causing severe rash involving mucus membranes or skin necrosis: No Has patient had a PCN reaction that required hospitalization Yes Has patient had a PCN reaction occurring within the last 10 years: No If all of the above answers are "NO", then may proceed with Cephalosporin use.    Family History: Family History  Problem Relation Age of Onset  . Physical abuse Mother   . Anxiety disorder Mother   . ADD / ADHD Mother   . Paranoid behavior Mother   . Drug abuse Father   . Depression Father   . Alcohol abuse Maternal Grandfather   . ADD / ADHD Maternal Grandmother   . Anxiety disorder Maternal Grandmother   . Dementia Maternal Grandmother   . OCD Maternal Grandmother   . Sexual abuse Maternal Grandmother   . Alcohol abuse Paternal Grandfather   . Bipolar disorder Cousin   . Schizophrenia Neg Hx   . Seizures Neg Hx     Social History:  reports that she has been smoking cigarettes.  She has been smoking about 0.25 packs per day. She has never used smokeless tobacco. She reports that she does not drink alcohol or use drugs.  ROS: UROLOGY Frequent Urination?: Yes Hard to postpone urination?: Yes Burning/pain with urination?: Yes Get up at night to urinate?: Yes Leakage of urine?: Yes Urine stream starts and stops?: Yes Trouble starting stream?: Yes Do you have to strain to urinate?: No Blood in urine?: Yes Urinary tract infection?: No Sexually transmitted disease?: No Injury to kidneys or bladder?: No Painful intercourse?: Yes Weak stream?: Yes Currently pregnant?: No Vaginal bleeding?: No Last  menstrual period?: n  Gastrointestinal Nausea?: Yes Vomiting?: Yes Indigestion/heartburn?: Yes Diarrhea?: No Constipation?: Yes  Constitutional Fever: No Night sweats?: Yes Weight loss?: No Fatigue?: Yes  Skin Skin rash/lesions?: No Itching?: Yes  Eyes Blurred vision?: Yes Double vision?: No  Ears/Nose/Throat Sore throat?: No Sinus problems?: Yes  Hematologic/Lymphatic Swollen glands?: No Easy bruising?: No  Cardiovascular Leg swelling?: No Chest pain?: No  Respiratory Cough?: No Shortness of breath?: No  Endocrine Excessive thirst?: No  Musculoskeletal Back pain?: Yes Joint pain?: Yes  Neurological Headaches?: Yes Dizziness?: No  Psychologic Depression?: No Anxiety?: Yes  Physical Exam: BP 122/83   Pulse 89   Ht 5' 5" (1.651 m)   Wt 215 lb (97.5 kg)   LMP 07/13/2017 Comment: neg preg  BMI   35.78 kg/m   Constitutional:  Alert and oriented, No acute distress. HEENT: [place] AT, moist mucus membranes.  Trachea midline, no masses. Cardiovascular: No clubbing, cyanosis, or edema.  RRR Respiratory: Normal respiratory effort, no increased work of breathing.  Lungs clear GI: Abdomen is soft, nontender, nondistended, no abdominal masses GU: No CVA tenderness Lymph: No cervical or inguinal lymphadenopathy. Skin: No rashes, bruises or suspicious lesions. Neurologic: Grossly intact, no focal deficits, moving all 4 extremities. Psychiatric: Normal mood and affect.  Laboratory Data: Lab Results  Component Value Date   WBC 14.2 (H) 07/20/2017   HGB 14.7 07/20/2017   HCT 43.7 07/20/2017   MCV 88.0 07/20/2017   PLT 347 07/20/2017    Lab Results  Component Value Date   CREATININE 1.23 (H) 07/20/2017    Urinalysis Dipstick 1+ leukocytes Microscopy 6-10 WBC, moderate bacteria  Imaging: CT was personally reviewed   Assessment & Plan:   31-year-old female with a 5 mm left distal ureteral calculus.  We discussed options of a trial of passage,  ureteroscopic removal and shockwave lithotripsy.  Based on stone size and location she was informed ureteroscopy would have the high success rate.  She would like to go ahead and schedule ureteroscopy.  Will add tamsulosin and her oxycodone was refilled.  A urine culture was ordered   The indications and nature of the planned procedure were discussed as well as the potential  benefits and expected outcome.  Alternatives have been discussed in detail. The most common complications and side effects were discussed including but not limited to infection/sepsis; blood loss; damage to urethra, bladder, ureter; need for multiple surgeries; need for prolonged stent placement as well as general anesthesia risks.  All of her questions were answered and she desires to proceed.  At the time of our visit he she did not appear to be distracted or in pain.     Scott C Stoioff, MD  [facility] [address], [address] [place], [place] [address] [phone]  

## 2017-07-25 ENCOUNTER — Telehealth: Payer: Self-pay | Admitting: Radiology

## 2017-07-25 NOTE — Telephone Encounter (Signed)
Pt requests call regarding KUB results. [phone]

## 2017-07-26 NOTE — Telephone Encounter (Signed)
Her stone was visualized on KUB.  She is scheduled for ureteroscopy next week.

## 2017-07-27 ENCOUNTER — Emergency Department: Payer: BLUE CROSS/BLUE SHIELD

## 2017-07-27 ENCOUNTER — Emergency Department
Admission: EM | Admit: 2017-07-27 | Discharge: 2017-07-28 | Disposition: A | Discharge status: Home / Self Care | Payer: BLUE CROSS/BLUE SHIELD | Attending: Emergency Medicine | Admitting: Emergency Medicine

## 2017-07-27 ENCOUNTER — Other Ambulatory Visit: Payer: Self-pay

## 2017-07-27 ENCOUNTER — Encounter: Payer: Self-pay | Admitting: Emergency Medicine

## 2017-07-27 DIAGNOSIS — N200 Calculus of kidney: Secondary | ICD-10-CM | POA: Insufficient documentation

## 2017-07-27 DIAGNOSIS — F909 Attention-deficit hyperactivity disorder, unspecified type: Secondary | ICD-10-CM | POA: Diagnosis not present

## 2017-07-27 DIAGNOSIS — F419 Anxiety disorder, unspecified: Secondary | ICD-10-CM | POA: Insufficient documentation

## 2017-07-27 DIAGNOSIS — J45909 Unspecified asthma, uncomplicated: Secondary | ICD-10-CM | POA: Insufficient documentation

## 2017-07-27 DIAGNOSIS — I10 Essential (primary) hypertension: Secondary | ICD-10-CM | POA: Insufficient documentation

## 2017-07-27 DIAGNOSIS — N3001 Acute cystitis with hematuria: Secondary | ICD-10-CM

## 2017-07-27 DIAGNOSIS — F17210 Nicotine dependence, cigarettes, uncomplicated: Secondary | ICD-10-CM | POA: Diagnosis not present

## 2017-07-27 DIAGNOSIS — R1032 Left lower quadrant pain: Secondary | ICD-10-CM | POA: Diagnosis present

## 2017-07-27 DIAGNOSIS — F329 Major depressive disorder, single episode, unspecified: Secondary | ICD-10-CM | POA: Insufficient documentation

## 2017-07-27 DIAGNOSIS — Z79899 Other long term (current) drug therapy: Secondary | ICD-10-CM | POA: Diagnosis not present

## 2017-07-27 LAB — CBC
HCT: 41.8 % (ref 35.0–47.0)
HEMOGLOBIN: 14.1 g/dL (ref 12.0–16.0)
MCH: 30.1 pg (ref 26.0–34.0)
MCHC: 33.7 g/dL (ref 32.0–36.0)
MCV: 89.2 fL (ref 80.0–100.0)
Platelets: 357 10*3/uL (ref 150–440)
RBC: 4.69 MIL/uL (ref 3.80–5.20)
RDW: 12.4 % (ref 11.5–14.5)
WBC: 8.3 10*3/uL (ref 3.6–11.0)

## 2017-07-27 LAB — COMPREHENSIVE METABOLIC PANEL
ALK PHOS: 55 U/L (ref 38–126)
ALT: 26 U/L (ref 14–54)
ANION GAP: 9 (ref 5–15)
AST: 21 U/L (ref 15–41)
Albumin: 4.1 g/dL (ref 3.5–5.0)
BUN: 16 mg/dL (ref 6–20)
CALCIUM: 8.8 mg/dL — AB (ref 8.9–10.3)
CO2: 24 mmol/L (ref 22–32)
Chloride: 105 mmol/L (ref 101–111)
Creatinine, Ser: 0.92 mg/dL (ref 0.44–1.00)
GFR calc non Af Amer: >60 mL/min (ref >60)
Glucose, Bld: 110 mg/dL — ABNORMAL HIGH (ref 65–99)
Potassium: 3.9 mmol/L (ref 3.5–5.1)
SODIUM: 138 mmol/L (ref 135–145)
Total Bilirubin: 0.4 mg/dL (ref 0.3–1.2)
Total Protein: 7.3 g/dL (ref 6.5–8.1)

## 2017-07-27 LAB — URINALYSIS, COMPLETE (UACMP) WITH MICROSCOPIC
BILIRUBIN URINE: NEGATIVE
GLUCOSE, UA: NEGATIVE mg/dL
Ketones, ur: NEGATIVE mg/dL
NITRITE: NEGATIVE
PROTEIN: NEGATIVE mg/dL
Specific Gravity, Urine: 1.016 (ref 1.005–1.030)
pH: 5 (ref 5.0–8.0)

## 2017-07-27 LAB — CULTURE, URINE COMPREHENSIVE

## 2017-07-27 LAB — PREGNANCY, URINE: Preg Test, Ur: NEGATIVE

## 2017-07-27 MED ORDER — CEPHALEXIN 500 MG PO CAPS: 500.0000 mg | ORAL_CAPSULE | Freq: Four times a day (QID) | ORAL | 0 refills | Status: AC

## 2017-07-27 MED ORDER — KETOROLAC TROMETHAMINE 30 MG/ML IJ SOLN
15.0000 mg | Freq: Once | INTRAMUSCULAR | Status: AC
  Administered 2017-07-27: 15 mg via INTRAVENOUS
  Filled 2017-07-27: qty 1

## 2017-07-27 MED ORDER — CEFTRIAXONE SODIUM 1 G IJ SOLR
1.0000 g | Freq: Once | INTRAMUSCULAR | Status: AC
  Administered 2017-07-27: 1 g via INTRAVENOUS
  Filled 2017-07-27: qty 10

## 2017-07-27 MED ORDER — ONDANSETRON 4 MG PO TBDP: 4.0000 mg | ORAL_TABLET | Freq: Three times a day (TID) | ORAL | 0 refills | Status: DC | PRN

## 2017-07-27 MED ORDER — ONDANSETRON HCL 4 MG/2ML IJ SOLN
4.0000 mg | Freq: Once | INTRAMUSCULAR | Status: AC
  Administered 2017-07-27: 4 mg via INTRAVENOUS
  Filled 2017-07-27: qty 2

## 2017-07-27 MED ORDER — OXYCODONE HCL 5 MG PO TABS
5.0000 mg | ORAL_TABLET | Freq: Once | ORAL | Status: AC
  Administered 2017-07-27: 5 mg via ORAL
  Filled 2017-07-27: qty 1

## 2017-07-27 MED ORDER — MORPHINE SULFATE (PF) 4 MG/ML IV SOLN
4.0000 mg | Freq: Once | INTRAVENOUS | Status: AC
  Administered 2017-07-27: 4 mg via INTRAVENOUS
  Filled 2017-07-27: qty 1

## 2017-07-27 MED ORDER — OXYCODONE-ACETAMINOPHEN 5-325 MG PO TABS
1.0000 | ORAL_TABLET | Freq: Once | ORAL | Status: AC
  Administered 2017-07-28: 1 via ORAL
  Filled 2017-07-27: qty 1

## 2017-07-27 MED ORDER — CIPROFLOXACIN IN D5W 400 MG/200ML IV SOLN
400.0000 mg | Freq: Once | INTRAVENOUS | Status: DC
  Filled 2017-07-27: qty 200

## 2017-07-27 MED ORDER — ACETAMINOPHEN 500 MG PO TABS
1000.0000 mg | ORAL_TABLET | Freq: Once | ORAL | Status: AC
  Administered 2017-07-27: 1000 mg via ORAL
  Filled 2017-07-27: qty 2

## 2017-07-27 NOTE — Discharge Instructions (Addendum)
You have been seen in the Emergency Department (ED)  Today and was diagnosed with kidney stones. While the stone is traveling through the ureter, which is the tube that carries urine from the kidney to the bladder, you will probably feel pain. The pain may be mild or very severe. You may also have some blood in your urine. As soon as the stone reaches the bladder, any intense pain should go away. If a stone is too large to pass on its own, you may need a medical procedure to help you pass the stone.   As we have discussed, please drink plenty of fluids and use a urinary strainer to attempt to capture the stone.  Please call your Urologist on Monday.  Return to the ER for worsening pain or fever.  Take ibuprofen 600mg  every 6 hours for the pain. If the pain is not well controlled with ibuprofen you may take one percocet every 4 hours. Do not take tylenol while taking percocet. Please also take your prescribed flomax daily. Check with your doctor if you have a history of gastritis, stomach ulcers, renal failure or impaired kidney function as you may not be able to take ibuprofen/ motrin. Your doctor can give you a different prescription for pain control.  Follow-up with your doctor or return to the ER in 12-24 hours if your pain is not well controlled, if you develop pain or burning with urination, or if you develop a fever. Otherwise follow up in 3-5 days with your doctor.  When should you call for help?  Call your doctor now or seek immediate medical care if:  You cannot keep down fluids.  Your pain gets worse.  You have a fever or chills.  You have new or worse pain in your back just below your rib cage (the flank area).  You have new or more blood in your urine. You have pain or burning with urination You are unable to urinate You have abdominal pain  Watch closely for changes in your health, and be sure to contact your doctor if:  You do not get better as expected  How can you care for  yourself at home?  Drink plenty of fluids, enough so that your urine is light yellow or clear like water. If you have kidney, heart, or liver disease and have to limit fluids, talk with your doctor before you increase the amount of fluids you drink.  Take pain medicines exactly as directed. Call your doctor if you think you are having a problem with your medicine.  If the doctor gave you a prescription medicine for pain, take it as prescribed.  If you are not taking a prescription pain medicine, ask your doctor if you can take an over-the-counter medicine. Read and follow all instructions on the label. Your doctor may ask you to strain your urine so that you can collect your kidney stone when it passes. You can use a kitchen strainer or a tea strainer to catch the stone. Store it in a plastic bag until you see your doctor again.  Preventing future kidney stones  Some changes in your diet may help prevent kidney stones. Depending on the cause of your stones, your doctor may recommend that you:  Drink plenty of fluids, enough so that your urine is light yellow or clear like water. If you have kidney, heart, or liver disease and have to limit fluids, talk with your doctor before you increase the amount of fluids you drink.  Limit coffee, tea, and alcohol. Also avoid grapefruit juice.  Do not take more than the recommended daily dose of vitamins C and D.  Avoid antacids such as Gaviscon, Maalox, Mylanta, or Tums.  Limit the amount of salt (sodium) in your diet.  Eat a balanced diet that is not too high in protein.  Limit foods that are high in a substance called oxalate, which can cause kidney stones. These foods include dark green vegetables, rhubarb, chocolate, wheat bran, nuts, cranberries, and beans.

## 2017-07-27 NOTE — ED Triage Notes (Signed)
Difficulty voiding since last week - found a kidney stone. Surgery scheduled the 16th of April. Pt believes the stone is stuck

## 2017-07-27 NOTE — ED Notes (Signed)
Bladder scan read 

## 2017-07-27 NOTE — ED Notes (Signed)
Blood work ordered by dr. Lenard Lancepaduchowski - hold ct until bun cr back

## 2017-07-27 NOTE — ED Provider Notes (Signed)
[facility] Emergency Department Provider Note  ____________________________________________  Time seen: Approximately 9:37 PM  I have reviewed the triage vital signs and the nursing notes.   HISTORY  Chief Complaint Urinary Retention   HPI Carol [name] is a 32 y.o. female who presents for evaluation of left flank pain.  Patient was seen here a week ago and diagnosed with a 5 mm left UVJ kidney stone.  She followed up with urology 3 days ago.  She has an appointment in 2 weeks for a ureteral stent if she does not pass the stone.  Patient reports that she has been pain-free for 5 days and the pain started again today.  She has been straining her urine and has not passed a stone.  She reports that her pain is 10 out of 10, severe, sharp, located in the left flank, radiating to her left groin.  The pain was sudden onset.  Associated with nausea.  No vomiting.  She is also complaining of dysuria which started today.  No fever but she has had chills.  Past Medical History:  Diagnosis Date  . ADHD   . Anxiety   . Asthma   . Bronchitis   . CNS disorder   . Depression   . Drug-seeking behavior   . Fatty liver disease, nonalcoholic   . Gall stones   . Gall stones   . Genital warts   . Hypertension   . Increased frequency of headaches   . Irritable bowel   . Kidney stones   . Major depressive disorder, recurrent severe without psychotic features (HCC)   . Migraines   . Urinary tract infection     Patient Active Problem List   Diagnosis Date Noted  . Pelvic pain in female 12/19/2016  . Condyloma acuminatum of vulva 10/22/2016  . MDD (major depressive disorder), recurrent severe, without psychosis (HCC) 11/01/2013  . 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 Borderline behavior 05/08/2012  . ADHD (attention deficit hyperactivity disorder) 05/08/2012  . Insomnia secondary to depression with anxiety 05/08/2012    Past Surgical History:  Procedure Laterality Date   . NO PAST SURGERIES      Prior to Admission medications   Medication Sig Start Date End Date Taking? Authorizing Provider  ALPRAZolam Prudy Feeler) 1 MG tablet Take 1 mg by mouth 3 (three) times daily as needed.     [provider]  buPROPion (WELLBUTRIN XL) 150 MG 24 hr tablet Take 150 mg by mouth daily.    [provider]  cephALEXin (KEFLEX) 500 MG capsule Take 1 capsule (500 mg total) by mouth 4 (four) times daily for 7 days. 07/27/17 08/03/17  Nita Sickle, MD  doxycycline (VIBRAMYCIN) 100 MG capsule Take 1 capsule (100 mg total) by mouth 2 (two) times daily. 07/19/17   Ward, Chase Picket, PA-C  nitrofurantoin (MACRODANTIN) 100 MG capsule Take 1 capsule (100 mg total) by mouth 4 (four) times daily for 7 days. 07/20/17 07/27/17  Arnaldo Natal, MD  ondansetron (ZOFRAN ODT) 4 MG disintegrating tablet Take 1 tablet (4 mg total) by mouth every 8 (eight) hours as needed for nausea or vomiting. 07/27/17   Don Perking, [place], MD  oxyCODONE-acetaminophen (PERCOCET/ROXICET) 5-325 MG tablet Take 1 tablet by mouth every 6 (six) hours as needed for severe pain. 07/24/17   Stoioff, Verna Czech, MD  tamsulosin (FLOMAX) 0.4 MG CAPS capsule Take 1 capsule (0.4 mg total) by mouth daily. 07/24/17   Stoioff, Verna Czech, MD  tiZANidine (ZANAFLEX) 4  MG capsule Take 4 mg by mouth 3 (three) times daily.    [provider]  VIMOVO 500-20 MG TBEC TAKE ONE TABLET BY MOUTH TWICE DAILY BEFORE MEALS 05/30/17   [provider]    Allergies Sulfa antibiotics; Pseudoephedrine; Cephalosporins; Ciprocin-fluocin-procin [fluocinolone acetonide]; Ciprofloxacin; Compazine [prochlorperazine]; Flagyl [metronidazole]; Hydrochlorothiazide; and Penicillins  Family History  Problem Relation Age of Onset  . Physical abuse Mother   . Anxiety disorder Mother   . ADD / ADHD Mother   . Paranoid behavior Mother   . Drug abuse Father   . Depression Father   . Alcohol abuse Maternal Grandfather   . ADD / ADHD  Maternal Grandmother   . Anxiety disorder Maternal Grandmother   . Dementia Maternal Grandmother   . OCD Maternal Grandmother   . Sexual abuse Maternal Grandmother   . Alcohol abuse Paternal Grandfather   . Bipolar disorder Cousin   . Schizophrenia Neg Hx   . Seizures Neg Hx     Social History Social History   Tobacco Use  . Smoking status: Current Every Day Smoker    Packs/day: 0.25    Types: Cigarettes  . Smokeless tobacco: Never Used  Substance Use Topics  . Alcohol use: No  . Drug use: No    Review of Systems  Constitutional: Negative for fever. Eyes: Negative for visual changes. ENT: Negative for sore throat. Neck: No neck pain  Cardiovascular: Negative for chest pain. Respiratory: Negative for shortness of breath. Gastrointestinal: Negative for abdominal pain, vomiting or diarrhea. Genitourinary: + dysuria and L flank pain Musculoskeletal: Negative for back pain. Skin: Negative for rash. Neurological: Negative for headaches, weakness or numbness. Psych: No SI or HI  ____________________________________________   PHYSICAL EXAM:  VITAL SIGNS: ED Triage Vitals  Enc Vitals Group     BP 07/27/17 1728 (!) 134/91     Pulse Rate 07/27/17 1728 93     Resp 07/27/17 1728 18     Temp 07/27/17 1728 98.3 F (36.8 C)     Temp Source 07/27/17 1728 Tympanic     SpO2 07/27/17 1728 98 %     Weight 07/27/17 1729 240 lb (108.9 kg)     Height 07/27/17 1729 5\' 5"  (1.651 m)     Head Circumference --      Peak Flow --      Pain Score 07/27/17 1740 10     Pain Loc --      Pain Edu? --      Excl. in GC? --     Constitutional: Alert and oriented, patient is crying and significant amount of pain.  HEENT:      Head: Normocephalic and atraumatic.         Eyes: Conjunctivae are normal. Sclera is non-icteric.       Mouth/Throat: Mucous membranes are moist.       Neck: Supple with no signs of meningismus. Cardiovascular: Regular rate and rhythm. No murmurs, gallops, or rubs.  2+ symmetrical distal pulses are present in all extremities. No JVD. Respiratory: Normal respiratory effort. Lungs are clear to auscultation bilaterally. No wheezes, crackles, or rhonchi.  Gastrointestinal: Soft, non tender, and non distended with positive bowel sounds. No rebound or guarding. Genitourinary: L CVA tenderness. Musculoskeletal: Nontender with normal range of motion in all extremities. No edema, cyanosis, or erythema of extremities. Neurologic: Normal speech and language. Face is symmetric. Moving all extremities. No gross focal neurologic deficits are appreciated. Skin: Skin is warm, dry and intact. No rash noted.  Psychiatric: Mood and affect are normal. Speech and behavior are normal.  ____________________________________________   LABS (all labs ordered are listed, but only abnormal results are displayed)  Labs Reviewed  COMPREHENSIVE METABOLIC PANEL - Abnormal; Notable for the following components:      Result Value   Glucose, Bld 110 (*)    Calcium 8.8 (*)    All other components within normal limits  URINALYSIS, COMPLETE (UACMP) WITH MICROSCOPIC - Abnormal; Notable for the following components:   Color, Urine YELLOW (*)    APPearance HAZY (*)    Hgb urine dipstick SMALL (*)    Leukocytes, UA MODERATE (*)    Bacteria, UA RARE (*)    Squamous Epithelial / LPF 0-5 (*)    All other components within normal limits  URINE CULTURE  CBC  PREGNANCY, URINE   ____________________________________________  EKG  none  ____________________________________________  RADIOLOGY  I have personally reviewed the images performed during this visit and I agree with the Radiologist's read.   Interpretation by Radiologist:  [place] Renal  Result Date: 07/27/2017 CLINICAL DATA:  LEFT flank pain. Assess for hydronephrosis. History of kidney stones and urinary tract infection. EXAM: RENAL / URINARY TRACT ULTRASOUND COMPLETE COMPARISON:  CT abdomen and pelvis July 20, 2017 FINDINGS:  Right Kidney: Length: 11.9 cm. Echogenicity within normal limits. No mass or hydronephrosis visualized. Left Kidney: Length: 12.8 cm. Echogenicity within normal limits. Moderate LEFT hydronephrosis. Bladder: Appears normal for degree of bladder distention. LEFT ureteral jet not demonstrated concerning for obstruction. IMPRESSION: Persistent moderate LEFT hydronephrosis. Electronically Signed   By: Awilda Metroourtnay  Bloomer M.D.   On: 07/27/2017 22:42      ____________________________________________   PROCEDURES  Procedure(s) performed: None Procedures Critical Care performed:  None ____________________________________________   INITIAL IMPRESSION / ASSESSMENT AND PLAN / ED COURSE   32 y.o. female who presents for evaluation of left flank pain after being diagnosed with a 5 mm left UVJ stone and a left renal stone a week ago.  Patient was pain-free for 5 days and pain restarted today.  She feels like she is unable to empty her bladder however her PVR is 28 cc.  She does have a UA consistent with a UTI but normal white count, normal vital signs, normal kidney function.  I discussed these findings with Dr. Alvester MorinBell, urologist on call who recommended outpatient management with Keflex and follow-up on Monday unless pain is not well controlled.  I will send patient for renal ultrasound since she was pain-free and the pain restarted now to see if there is evidence of hydronephrosis. Will give Toradol, morphine, Flomax, Zofran.    _________________________ 11:25 PM on 07/27/2017 -----------------------------------------  Patient's pain is well controlled with PO meds. She has a prescription from the urologist office for Flomax and Percocet that she has not filled therefore we will not provide another prescription at this time.  We will give her a prescription for Zofran.  Patient unfortunately has several antibiotic allergies.  Even though it is listed that she has hives to cephalosporins patient reports that  she is able to take Rocephin with no problems.  Cipro is not listed as one of her allergies but she reports having hives with that in the past. Cipro added to her list of allergies. She is also allergic to penicillin and Bactrim which makes it very limited my option of antibiotics.  She is going to be given Keflex for home.   As part of my medical decision making, I reviewed the following  data within the electronic MEDICAL RECORD NUMBER Nursing notes reviewed and incorporated, Labs reviewed , Old chart reviewed, Radiograph reviewed , A consult was requested and obtained from this/these consultant(s) Urology, Notes from prior ED visits and [place] Controlled Substance Database    Pertinent labs & imaging results that were available during my care of the patient were reviewed by me and considered in my medical decision making (see chart for details).    ____________________________________________   FINAL CLINICAL IMPRESSION(S) / ED DIAGNOSES  Final diagnoses:  Kidney stone  Acute cystitis with hematuria      NEW MEDICATIONS STARTED DURING THIS VISIT:  ED Discharge Orders        Ordered    ondansetron (ZOFRAN ODT) 4 MG disintegrating tablet  Every 8 hours PRN     07/27/17 2316    cephALEXin (KEFLEX) 500 MG capsule  4 times daily     07/27/17 2325       Note:  This document was prepared using Dragon voice recognition software and may include unintentional dictation errors.    Nita Sickle, MD 07/27/17 2329

## 2017-07-29 ENCOUNTER — Encounter: Payer: Self-pay | Admitting: Urology

## 2017-07-29 LAB — URINE CULTURE: Culture: <10000 — AB

## 2017-07-29 NOTE — Telephone Encounter (Signed)
Attempted to reach pt via [phone], phone number is no longer in service.

## 2017-07-29 NOTE — Telephone Encounter (Signed)
Pt called stating she was in the ER over the weekend with flank pain & "can't pee". Per pt, ER physician advised pt to call our office because she wasn't in retention & since she was urinating some she did not need an emergency procedure.

## 2017-07-30 ENCOUNTER — Other Ambulatory Visit: Payer: Self-pay

## 2017-07-30 ENCOUNTER — Encounter
Admission: RE | Admit: 2017-07-30 | Discharge: 2017-07-30 | Disposition: A | Discharge status: Home / Self Care | Payer: BLUE CROSS/BLUE SHIELD | Source: Ambulatory Visit | Attending: Urology | Admitting: Urology

## 2017-07-30 ENCOUNTER — Telehealth: Payer: Self-pay | Admitting: Radiology

## 2017-07-30 HISTORY — DX: Personal history of urinary calculi: Z87.442

## 2017-07-30 NOTE — Telephone Encounter (Signed)
Cipro was ordered for pre-op antibiotics but pt gets hives with cipro. Please advise.

## 2017-07-30 NOTE — Patient Instructions (Signed)
Your procedure is scheduled on: 08/01/17 Report to Day Surgery.[facility] SECOND FLOOR To find out your arrival time please call [phone] between 1PM - 3PM on 07/31/17.  Remember: Instructions that are not followed completely may result in serious medical risk, up to and including death, or upon the discretion of your surgeon and anesthesiologist your surgery may need to be rescheduled.     _X__ 1. Do not eat food after midnight the night before your procedure.                 No gum chewing or hard candies. You may drink clear liquids up to 2 hours                 before you are scheduled to arrive for your surgery- DO not drink clear                 liquids within 2 hours of the start of your surgery.                 Clear Liquids include:  water, apple juice without pulp, clear carbohydrate                 drink such as Clearfast of Gartorade, Black Coffee or Tea (Do not add                 anything to coffee or tea).  __X__2.  On the morning of surgery brush your teeth with toothpaste and water, you                 may rinse your mouth with mouthwash if you wish.  Do not swallow any              toothpaste of mouthwash.     _X__ 3.  No Alcohol for 24 hours before or after surgery.   _X__ 4.  Do Not Smoke or use e-cigarettes For 24 Hours Prior to Your Surgery.                 Do not use any chewable tobacco products for at least 6 hours prior to                 surgery.  ____  5.  Bring all medications with you on the day of surgery if instructed.   _X___  6.  Notify your doctor if there is any change in your medical condition      (cold, fever, infections).     Do not wear jewelry, make-up, hairpins, clips or nail polish. Do not wear lotions, powders, or perfumes. You may wear deodorant. Do not shave 48 hours prior to surgery. Men may shave face and neck. Do not bring valuables to the hospital.    [facility] is not responsible for any belongings or  valuables.  Contacts, dentures or bridgework may not be worn into surgery. Leave your suitcase in the car. After surgery it may be brought to your room. For patients admitted to the hospital, discharge time is determined by your treatment team.   Patients discharged the day of surgery will not be allowed to drive home.    _X___ Take these medicines the morning of surgery with A SIP OF WATER:    1. ALPRAZOLAM  2.   3.   4.  5.  6.  ____ Fleet Enema (as directed)   ____ Use CHG Soap as directed  ____ Use inhalers on the day of  surgery  ____ Stop metformin 2 days prior to surgery    ____ Take 1/2 of usual insulin dose the night before surgery. No insulin the morning          of surgery.   ____ Stop Coumadin/Plavix/aspirin on  ____ Stop Anti-inflammatories on  ____ Stop supplements until after surgery.    ____ Bring C-Pap to the hospital.

## 2017-07-31 ENCOUNTER — Other Ambulatory Visit: Payer: Self-pay | Admitting: Radiology

## 2017-07-31 DIAGNOSIS — N201 Calculus of ureter: Secondary | ICD-10-CM

## 2017-07-31 NOTE — Telephone Encounter (Signed)
Gentamicin 160 mg IV

## 2017-08-01 ENCOUNTER — Ambulatory Visit: Payer: BLUE CROSS/BLUE SHIELD | Admitting: Certified Registered"

## 2017-08-01 ENCOUNTER — Encounter: Payer: Self-pay | Admitting: *Deleted

## 2017-08-01 ENCOUNTER — Other Ambulatory Visit: Payer: Self-pay

## 2017-08-01 ENCOUNTER — Ambulatory Visit
Admission: RE | Admit: 2017-08-01 | Discharge: 2017-08-01 | Disposition: A | Discharge status: Home / Self Care | Payer: BLUE CROSS/BLUE SHIELD | Source: Ambulatory Visit | Attending: Urology | Admitting: Urology

## 2017-08-01 ENCOUNTER — Telehealth: Payer: Self-pay | Admitting: Urology

## 2017-08-01 ENCOUNTER — Encounter
Admission: RE | Disposition: A | Discharge status: Home / Self Care | Payer: Self-pay | Source: Ambulatory Visit | Attending: Urology

## 2017-08-01 DIAGNOSIS — K589 Irritable bowel syndrome without diarrhea: Secondary | ICD-10-CM | POA: Insufficient documentation

## 2017-08-01 DIAGNOSIS — K8020 Calculus of gallbladder without cholecystitis without obstruction: Secondary | ICD-10-CM | POA: Insufficient documentation

## 2017-08-01 DIAGNOSIS — G43909 Migraine, unspecified, not intractable, without status migrainosus: Secondary | ICD-10-CM | POA: Insufficient documentation

## 2017-08-01 DIAGNOSIS — F329 Major depressive disorder, single episode, unspecified: Secondary | ICD-10-CM | POA: Diagnosis not present

## 2017-08-01 DIAGNOSIS — J45909 Unspecified asthma, uncomplicated: Secondary | ICD-10-CM | POA: Insufficient documentation

## 2017-08-01 DIAGNOSIS — F909 Attention-deficit hyperactivity disorder, unspecified type: Secondary | ICD-10-CM | POA: Insufficient documentation

## 2017-08-01 DIAGNOSIS — K760 Fatty (change of) liver, not elsewhere classified: Secondary | ICD-10-CM | POA: Diagnosis not present

## 2017-08-01 DIAGNOSIS — N201 Calculus of ureter: Secondary | ICD-10-CM

## 2017-08-01 DIAGNOSIS — F419 Anxiety disorder, unspecified: Secondary | ICD-10-CM | POA: Insufficient documentation

## 2017-08-01 DIAGNOSIS — I10 Essential (primary) hypertension: Secondary | ICD-10-CM | POA: Diagnosis not present

## 2017-08-01 DIAGNOSIS — Z87442 Personal history of urinary calculi: Secondary | ICD-10-CM | POA: Diagnosis not present

## 2017-08-01 DIAGNOSIS — N132 Hydronephrosis with renal and ureteral calculous obstruction: Secondary | ICD-10-CM | POA: Diagnosis not present

## 2017-08-01 HISTORY — PX: CYSTOSCOPY/URETEROSCOPY/HOLMIUM LASER/STENT PLACEMENT: SHX6546

## 2017-08-01 LAB — POCT PREGNANCY, URINE: Preg Test, Ur: NEGATIVE

## 2017-08-01 SURGERY — CYSTOSCOPY/URETEROSCOPY/HOLMIUM LASER/STENT PLACEMENT
Anesthesia: General | Site: Ureter | Laterality: Left | Wound class: Clean Contaminated

## 2017-08-01 MED ORDER — HYDROMORPHONE HCL 1 MG/ML IJ SOLN
INTRAMUSCULAR | Status: DC | PRN
  Administered 2017-08-01: 2 mg via INTRAVENOUS

## 2017-08-01 MED ORDER — HYDROCODONE-ACETAMINOPHEN 5-325 MG PO TABS
1.0000 | ORAL_TABLET | Freq: Four times a day (QID) | ORAL | Status: DC | PRN
  Administered 2017-08-01: 1 via ORAL

## 2017-08-01 MED ORDER — FENTANYL CITRATE (PF) 100 MCG/2ML IJ SOLN
25.0000 ug | INTRAMUSCULAR | Status: DC | PRN
  Administered 2017-08-01 (×5): 25 ug via INTRAVENOUS

## 2017-08-01 MED ORDER — FENTANYL CITRATE (PF) 100 MCG/2ML IJ SOLN
INTRAMUSCULAR | Status: AC
  Administered 2017-08-01: 25 ug via INTRAVENOUS
  Filled 2017-08-01: qty 2

## 2017-08-01 MED ORDER — GLYCOPYRROLATE 0.2 MG/ML IJ SOLN
INTRAMUSCULAR | Status: AC
  Filled 2017-08-01: qty 1

## 2017-08-01 MED ORDER — MIDAZOLAM HCL 2 MG/2ML IJ SOLN
INTRAMUSCULAR | Status: DC | PRN
  Administered 2017-08-01: 4 mg via INTRAVENOUS

## 2017-08-01 MED ORDER — KETOROLAC TROMETHAMINE 30 MG/ML IJ SOLN
INTRAMUSCULAR | Status: DC | PRN
  Administered 2017-08-01: 30 mg via INTRAVENOUS

## 2017-08-01 MED ORDER — FAMOTIDINE 20 MG PO TABS
ORAL_TABLET | ORAL | Status: AC
  Administered 2017-08-01: 20 mg via ORAL
  Filled 2017-08-01: qty 1

## 2017-08-01 MED ORDER — ROCURONIUM BROMIDE 100 MG/10ML IV SOLN
INTRAVENOUS | Status: DC | PRN
  Administered 2017-08-01: 50 mg via INTRAVENOUS

## 2017-08-01 MED ORDER — ROCURONIUM BROMIDE 50 MG/5ML IV SOLN
INTRAVENOUS | Status: AC
  Filled 2017-08-01: qty 1

## 2017-08-01 MED ORDER — MIDAZOLAM HCL 2 MG/2ML IJ SOLN
INTRAMUSCULAR | Status: AC
  Filled 2017-08-01: qty 4

## 2017-08-01 MED ORDER — LACTATED RINGERS IV SOLN
INTRAVENOUS | Status: DC
  Administered 2017-08-01: 09:00:00 via INTRAVENOUS

## 2017-08-01 MED ORDER — HYDROCODONE-ACETAMINOPHEN 5-325 MG PO TABS: 1.0000 | ORAL_TABLET | Freq: Four times a day (QID) | ORAL | 0 refills | Status: DC | PRN

## 2017-08-01 MED ORDER — KETOROLAC TROMETHAMINE 30 MG/ML IJ SOLN
INTRAMUSCULAR | Status: AC
  Filled 2017-08-01: qty 1

## 2017-08-01 MED ORDER — ONDANSETRON HCL 4 MG/2ML IJ SOLN: 4.0000 mg | Freq: Once | INTRAMUSCULAR | Status: DC | PRN

## 2017-08-01 MED ORDER — OXYBUTYNIN CHLORIDE 5 MG PO TABS: 5.0000 mg | ORAL_TABLET | Freq: Three times a day (TID) | ORAL | 0 refills | Status: DC | PRN

## 2017-08-01 MED ORDER — HYDROCODONE-ACETAMINOPHEN 5-325 MG PO TABS
ORAL_TABLET | ORAL | Status: AC
  Filled 2017-08-01: qty 1

## 2017-08-01 MED ORDER — HYDROMORPHONE HCL 1 MG/ML IJ SOLN
INTRAMUSCULAR | Status: AC
  Filled 2017-08-01: qty 2

## 2017-08-01 MED ORDER — PROPOFOL 10 MG/ML IV BOLUS
INTRAVENOUS | Status: DC | PRN
  Administered 2017-08-01: 200 mg via INTRAVENOUS

## 2017-08-01 MED ORDER — DEXAMETHASONE SODIUM PHOSPHATE 10 MG/ML IJ SOLN
INTRAMUSCULAR | Status: AC
Start: 2017-08-01 — End: 2017-08-01
  Filled 2017-08-01: qty 1

## 2017-08-01 MED ORDER — LIDOCAINE HCL (PF) 2 % IJ SOLN
INTRAMUSCULAR | Status: AC
  Filled 2017-08-01: qty 10

## 2017-08-01 MED ORDER — FAMOTIDINE 20 MG PO TABS
20.0000 mg | ORAL_TABLET | Freq: Once | ORAL | Status: AC
  Administered 2017-08-01: 20 mg via ORAL

## 2017-08-01 MED ORDER — PROPOFOL 10 MG/ML IV BOLUS
INTRAVENOUS | Status: AC
Start: 2017-08-01 — End: 2017-08-01
  Filled 2017-08-01: qty 20

## 2017-08-01 MED ORDER — DOCUSATE SODIUM 100 MG PO CAPS: 100.0000 mg | ORAL_CAPSULE | Freq: Two times a day (BID) | ORAL | 0 refills | Status: DC

## 2017-08-01 MED ORDER — GLYCOPYRROLATE 0.2 MG/ML IJ SOLN
INTRAMUSCULAR | Status: DC | PRN
  Administered 2017-08-01: 0.1 mg via INTRAVENOUS

## 2017-08-01 MED ORDER — OXYBUTYNIN CHLORIDE 5 MG PO TABS
5.0000 mg | ORAL_TABLET | Freq: Three times a day (TID) | ORAL | Status: DC | PRN
  Administered 2017-08-01: 5 mg via ORAL
  Filled 2017-08-01: qty 1

## 2017-08-01 MED ORDER — ONDANSETRON HCL 4 MG/2ML IJ SOLN
INTRAMUSCULAR | Status: DC | PRN
  Administered 2017-08-01: 4 mg via INTRAVENOUS

## 2017-08-01 MED ORDER — LIDOCAINE HCL (CARDIAC) 20 MG/ML IV SOLN
INTRAVENOUS | Status: DC | PRN
  Administered 2017-08-01: 50 mg via INTRAVENOUS

## 2017-08-01 MED ORDER — GENTAMICIN SULFATE 40 MG/ML IJ SOLN
160.0000 mg | INTRAVENOUS | Status: AC
  Administered 2017-08-01: 160 mg via INTRAVENOUS
  Filled 2017-08-01: qty 4

## 2017-08-01 MED ORDER — ONDANSETRON HCL 4 MG/2ML IJ SOLN
INTRAMUSCULAR | Status: AC
  Filled 2017-08-01: qty 2

## 2017-08-01 MED ORDER — SUGAMMADEX SODIUM 500 MG/5ML IV SOLN
INTRAVENOUS | Status: AC
  Filled 2017-08-01: qty 5

## 2017-08-01 MED ORDER — DEXAMETHASONE SODIUM PHOSPHATE 10 MG/ML IJ SOLN
INTRAMUSCULAR | Status: DC | PRN
  Administered 2017-08-01: 10 mg via INTRAVENOUS

## 2017-08-01 MED ORDER — ACETAMINOPHEN 10 MG/ML IV SOLN
INTRAVENOUS | Status: DC | PRN
  Administered 2017-08-01: 1000 mg via INTRAVENOUS

## 2017-08-01 MED ORDER — OXYBUTYNIN CHLORIDE 5 MG PO TABS
ORAL_TABLET | ORAL | Status: AC
  Filled 2017-08-01: qty 1

## 2017-08-01 MED ORDER — SUGAMMADEX SODIUM 200 MG/2ML IV SOLN
INTRAVENOUS | Status: DC | PRN
  Administered 2017-08-01: 225 mg via INTRAVENOUS

## 2017-08-01 SURGICAL SUPPLY — 32 items
BAG DRAIN CYSTO-URO LG1000N (MISCELLANEOUS) ×3 IMPLANT
BASKET ZERO TIP 1.9FR (BASKET) ×2 IMPLANT
BRUSH SCRUB EZ 1% IODOPHOR (MISCELLANEOUS) ×3 IMPLANT
BSKT STON RTRVL ZERO TP 1.9FR (BASKET) ×1
CATH URETL 5X70 OPEN END (CATHETERS) ×3 IMPLANT
CNTNR SPEC 2.5X3XGRAD LEK (MISCELLANEOUS)
CONRAY 43 FOR UROLOGY 50M (MISCELLANEOUS) ×3 IMPLANT
CONT SPEC 4OZ STER OR WHT (MISCELLANEOUS)
CONT SPEC 4OZ STRL OR WHT (MISCELLANEOUS)
CONTAINER SPEC 2.5X3XGRAD LEK (MISCELLANEOUS) IMPLANT
DRAPE UTILITY 15X26 TOWEL STRL (DRAPES) ×3 IMPLANT
FIBER LASER LITHO 273 (Laser) ×2 IMPLANT
GLOVE BIO SURGEON STRL SZ 6.5 (GLOVE) ×2 IMPLANT
GLOVE BIO SURGEONS STRL SZ 6.5 (GLOVE) ×1
GOWN STRL REUS W/ TWL LRG LVL3 (GOWN DISPOSABLE) ×2 IMPLANT
GOWN STRL REUS W/TWL LRG LVL3 (GOWN DISPOSABLE) ×6
GUIDEWIRE GREEN .038 145CM (MISCELLANEOUS) IMPLANT
INFUSOR MANOMETER BAG 3000ML (MISCELLANEOUS) ×3 IMPLANT
INTRODUCER DILATOR DOUBLE (INTRODUCER) IMPLANT
KIT TURNOVER CYSTO (KITS) ×3 IMPLANT
PACK CYSTO AR (MISCELLANEOUS) ×3 IMPLANT
SENSORWIRE 0.038 NOT ANGLED (WIRE) ×6
SET CYSTO W/LG BORE CLAMP LF (SET/KITS/TRAYS/PACK) ×3 IMPLANT
SHEATH URETERAL 12FRX35CM (MISCELLANEOUS) IMPLANT
SOL .9 NS 3000ML IRR  AL (IV SOLUTION) ×2
SOL .9 NS 3000ML IRR AL (IV SOLUTION) ×1
SOL .9 NS 3000ML IRR UROMATIC (IV SOLUTION) ×1 IMPLANT
STENT URET 6FRX24 CONTOUR (STENTS) ×2 IMPLANT
STENT URET 6FRX26 CONTOUR (STENTS) IMPLANT
SURGILUBE 2OZ TUBE FLIPTOP (MISCELLANEOUS) ×3 IMPLANT
WATER STERILE IRR 1000ML POUR (IV SOLUTION) ×3 IMPLANT
WIRE SENSOR 0.038 NOT ANGLED (WIRE) ×2 IMPLANT

## 2017-08-01 NOTE — Anesthesia Procedure Notes (Signed)
Procedure Name: Intubation Date/Time: 08/01/2017 10:22 AM Performed by: Sherol DadeMacMang, Kammy Klett H, CRNA Pre-anesthesia Checklist: Patient identified, Emergency Drugs available, Suction available, Patient being monitored and Timeout performed Patient Re-evaluated:Patient Re-evaluated prior to induction Oxygen Delivery Method: Circle system utilized Preoxygenation: Pre-oxygenation with 100% oxygen Induction Type: IV induction Ventilation: Mask ventilation without difficulty Laryngoscope Size: Miller and 2 Grade View: Grade I Tube type: Oral Tube size: 7.5 mm Number of attempts: 1 Airway Equipment and Method: Stylet Placement Confirmation: ETT inserted through vocal cords under direct vision,  positive ETCO2,  CO2 detector and breath sounds checked- equal and bilateral Secured at: 22 cm Tube secured with: Tape Dental Injury: Teeth and Oropharynx as per pre-operative assessment

## 2017-08-01 NOTE — Discharge Instructions (Signed)
You have a ureteral stent in place.  This is a tube that extends from your kidney to your bladder.  This may cause urinary bleeding, burning with urination, and urinary frequency.  Please call our office or present to the ED if you develop fevers >101 or pain which is not able to be controlled with oral pain medications.  You may be given either Flomax and/ or ditropan to help with bladder spasms and stent pain in addition to pain medications.    Your stent is attached to a string which is taped to her left inner thigh.  Please take care not to accidentally dislodged this when going to the bathroom or change in close. You should leave this in place until Monday.  On Monday morning, untaped the stent string, pulled gently until the entire stent is removed.  If you have any questions or concerns, please call our office.  If the stent is accidentally dislodged prematurely, please let us know during business hours.  [facility] [address], [address] [place], [place] [address] [phone]   AMBULATORY SURGERY  DISCHARGE INSTRUCTIONS   1) The drugs that you were given will stay in your system until tomorrow so for the next 24 hours you should not:  A) Drive an automobile B) Make any legal decisions C) Drink any alcoholic beverage   2) You may resume regular meals tomorrow.  Today it is better to start with liquids and gradually work up to solid foods.  You may eat anything you prefer, but it is better to start with liquids, then soup and crackers, and gradually work up to solid foods.   3) Please notify your doctor immediately if you have any unusual bleeding, trouble breathing, redness and pain at the surgery site, drainage, fever, or pain not relieved by medication.    4) Additional Instructions:    Please contact your physician with any problems or Same Day Surgery at [phone], Monday through Friday 6 am to 4 pm, or [place] [facility]  number at [phone].

## 2017-08-01 NOTE — Op Note (Signed)
Date of procedure: 08/01/17  Preoperative diagnosis:  1. Left distal ureteral calculus 2. Left flank pain  Postoperative diagnosis:  1. Same as above  Procedure: 1. Left ureteroscopy 2. Laser lithotripsy 3. Left ureteral stent placement 4. Left retrograde pyelogram 5. Basket extraction of stone fragment  Surgeon: Vanna ScotlandAshley Lewayne Pauley, MD  Anesthesia: General  Complications: None  Intraoperative findings: 5 mm left distal ureteral calculus fragmented in all stone material removed via basket extraction.  Minimal ureteral edema or trauma.  Stent placed on tether.  EBL: Minimal  Specimens: None, specimens too small to catch  Drains: 6 x 24 French double-J ureteral stent on left, string left in place  Indication: [name] is a 32 y.o. patient with 5 mm left distal obstructing ureteral calculus with poorly controlled pain.  After reviewing the management options for treatment, she elected to proceed with the above surgical procedure(s). We have discussed the potential benefits and risks of the procedure, side effects of the proposed treatment, the likelihood of the patient achieving the goals of the procedure, and any potential problems that might occur during the procedure or recuperation. Informed consent has been obtained.  Description of procedure:  The patient was taken to the operating room and general anesthesia was induced.  The patient was placed in the dorsal lithotomy position, prepped and draped in the usual sterile fashion, and preoperative antibiotics were administered. A preoperative time-out was performed.   A 21 French cystoscope was advanced per urethra into the bladder.  Notably, squamous metaplasia was noted at the bladder neck and along the trigone.  Attention was turned to the left ureteral orifice which was cannulated with a 5 [place] open-ended ureteral catheter.  The stone was not readily seen on scout imaging.  Gentle retrograde pyelogram was performed  revealing a very small filling defect just within the UO.  A wire was then placed beyond this up to the level of the kidney without difficulty.  Was snapped in place as a safety wire.  A 4.5 semirigid ureteroscope was advanced within the distal ureter and the stone was encountered.  A 273 m laser fiber was used using the settings of 0.8 J and 10 Hz, the stone was fragment into very small pieces.  These pieces were then extracted via 1.9 French tipless nitinol basket.  All fragments were removed.  The scope was able to advance all the way up to level the proximal ureter no additional stone fragments were identified.  A 6 x 24 French double-J ureteral stent was placed over a wire up to level the kidney.  The wires partially drawn until full coils noted within the kidney.  The wire was then fully withdrawn a full coils noted within the bladder.  The bladder was then drained.  The tether was left affixed to the stent string which was fixed to the left inner thigh using Mastisol and Tegaderm.  She is clean and dry, repositioned supine position, reversed anesthesia, taken to PACU in stable condition.  Plan: Patient will remove her own stent on Monday.  She will follow-up in 4 weeks with renal ultrasound prior.  Vanna ScotlandAshley Gar Glance, M.D.

## 2017-08-01 NOTE — Anesthesia Post-op Follow-up Note (Signed)
Anesthesia QCDR form completed.        

## 2017-08-01 NOTE — Interval H&P Note (Signed)
History and Physical Interval Note:  08/01/2017 10:06 AM  Carol [name]  has presented today for surgery, with the diagnosis of left ureteral calculus  The various methods of treatment have been discussed with the patient and family. After consideration of risks, benefits and other options for treatment, the patient has consented to  Procedure(s): CYSTOSCOPY/URETEROSCOPY/HOLMIUM LASER/STENT PLACEMENT (Left) as a surgical intervention .  The patient's history has been reviewed, patient examined, no change in status, stable for surgery.  I have reviewed the patient's chart and labs.  Questions were answered to the patient's satisfaction.    RRR CTAB  Vanna ScotlandAshley Virgie Chery

## 2017-08-01 NOTE — Anesthesia Postprocedure Evaluation (Signed)
Anesthesia Post Note  Patient: Carol MetzBrittany E [name]  Procedure(s) Performed: CYSTOSCOPY/URETEROSCOPY/HOLMIUM LASER/STENT PLACEMENT (Left Ureter)  Patient location during evaluation: PACU Anesthesia Type: General Level of consciousness: awake and alert and oriented Pain management: pain level controlled Vital Signs Assessment: post-procedure vital signs reviewed and stable Respiratory status: spontaneous breathing Cardiovascular status: blood pressure returned to baseline Anesthetic complications: no     Last Vitals:  Vitals:   08/01/17 1221 08/01/17 1358  BP: (P) 124/71 (!) 135/95  Pulse: (P) 66 66  Resp: (P) 16 16  Temp:    SpO2: (P) 99% 97%    Last Pain:  Vitals:   08/01/17 1358  TempSrc:   PainSc: 3                  Cadon Raczka

## 2017-08-01 NOTE — Transfer of Care (Signed)
Immediate Anesthesia Transfer of Care Note  Patient: Carol [name]  Procedure(s) Performed: CYSTOSCOPY/URETEROSCOPY/HOLMIUM LASER/STENT PLACEMENT (Left Ureter)  Patient Location: PACU  Anesthesia Type:General  Level of Consciousness: awake, alert , oriented and patient cooperative  Airway & Oxygen Therapy: Patient Spontanous Breathing and Patient connected to face mask oxygen  Post-op Assessment: Report given to RN, Post -op Vital signs reviewed and stable and Patient moving all extremities X 4  Post vital signs: Reviewed and stable  Last Vitals:  Vitals Value Taken Time  BP 137/94 08/01/2017 11:00 AM  Temp 36.2 C 08/01/2017 11:00 AM  Pulse 97 08/01/2017 11:03 AM  Resp 19 08/01/2017 11:03 AM  SpO2 99 % 08/01/2017 11:03 AM  Vitals shown include unvalidated device data.  Last Pain:  Vitals:   08/01/17 1100  TempSrc:   PainSc: Asleep         Complications: No apparent anesthesia complications

## 2017-08-01 NOTE — Anesthesia Preprocedure Evaluation (Signed)
Anesthesia Evaluation  Patient identified by MRN, date of birth, ID band Patient awake    Reviewed: Allergy & Precautions, NPO status , Patient's Chart, lab work & pertinent test results  Airway Mallampati: III  TM Distance: >3 FB     Dental  (+) Chipped   Pulmonary asthma , Current Smoker,    Pulmonary exam normal        Cardiovascular hypertension, Pt. on medications Normal cardiovascular exam     Neuro/Psych  Headaches, PSYCHIATRIC DISORDERS Anxiety Depression    GI/Hepatic Fatty liver IBS   Endo/Other  Morbid obesity  Renal/GU Stones      Musculoskeletal negative musculoskeletal ROS (+)   Abdominal Normal abdominal exam  (+)   Peds negative pediatric ROS (+)  Hematology negative hematology ROS (+)   Anesthesia Other Findings   Reproductive/Obstetrics                             Anesthesia Physical Anesthesia Plan  ASA: II  Anesthesia Plan: General   Post-op Pain Management:    Induction: Intravenous  PONV Risk Score and Plan:   Airway Management Planned: Oral ETT  Additional Equipment:   Intra-op Plan:   Post-operative Plan: Extubation in OR  Informed Consent: I have reviewed the patients History and Physical, chart, labs and discussed the procedure including the risks, benefits and alternatives for the proposed anesthesia with the patient or authorized representative who has indicated his/her understanding and acceptance.   Dental advisory given  Plan Discussed with: CRNA and Surgeon  Anesthesia Plan Comments:         Anesthesia Quick Evaluation

## 2017-08-01 NOTE — Telephone Encounter (Signed)
appt made ° ° °Carol [name] ° °

## 2017-08-02 ENCOUNTER — Telehealth: Payer: Self-pay

## 2017-08-02 NOTE — Telephone Encounter (Signed)
Pt called inquiring about when stent could be removed. Read pt Dr. Delana MeyerBrandon's op note instructions. Pt also stated that pharmacy will not fill narcotic from [facility] due to pt getting narcotics monthly from PCP. Reinforced with pt should be taking ibuprofen/tylenol alternating q4h for stent pain. Pt voiced frustration and understanding.

## 2017-08-02 NOTE — Telephone Encounter (Signed)
Pt called stating she was given an rx by Dr. Lonna CobbStoioff for oxycodone, pt states she can't get it filled because she is on Oxycodone monthly by her PCP. Pt states that she was given Hydrocodone by Dr. Apolinar JunesBrandon, but was informed by multiple people not to take the Hydrocodone because she is on oxycodone. Pt would like to know what she should be taking. Pt has had previous conversation w/ Chelsea discussing medications, but would like to know what medication she should take. I informed pt that Stoioff only refilled what her pcp was giving her, she states understanding, pt states she does not want to take Hydrocodone, She is going to take Oxycodone and alternate with ibuprofen.

## 2017-08-08 ENCOUNTER — Other Ambulatory Visit: Payer: Self-pay

## 2017-09-06 ENCOUNTER — Ambulatory Visit: Admission: RE | Admit: 2017-09-06 | Payer: BLUE CROSS/BLUE SHIELD | Source: Ambulatory Visit

## 2017-09-11 ENCOUNTER — Encounter: Payer: Self-pay | Admitting: Urology

## 2017-09-11 ENCOUNTER — Ambulatory Visit: Payer: BLUE CROSS/BLUE SHIELD | Admitting: Urology

## 2017-09-18 ENCOUNTER — Emergency Department: Payer: BLUE CROSS/BLUE SHIELD

## 2017-09-18 ENCOUNTER — Emergency Department
Admission: EM | Admit: 2017-09-18 | Discharge: 2017-09-18 | Disposition: A | Discharge status: Home / Self Care | Payer: BLUE CROSS/BLUE SHIELD | Attending: Emergency Medicine | Admitting: Emergency Medicine

## 2017-09-18 ENCOUNTER — Encounter: Payer: Self-pay | Admitting: Emergency Medicine

## 2017-09-18 ENCOUNTER — Other Ambulatory Visit: Payer: Self-pay

## 2017-09-18 DIAGNOSIS — K8020 Calculus of gallbladder without cholecystitis without obstruction: Secondary | ICD-10-CM

## 2017-09-18 DIAGNOSIS — R1011 Right upper quadrant pain: Secondary | ICD-10-CM | POA: Diagnosis present

## 2017-09-18 DIAGNOSIS — J45909 Unspecified asthma, uncomplicated: Secondary | ICD-10-CM | POA: Diagnosis not present

## 2017-09-18 DIAGNOSIS — I10 Essential (primary) hypertension: Secondary | ICD-10-CM | POA: Insufficient documentation

## 2017-09-18 DIAGNOSIS — F17210 Nicotine dependence, cigarettes, uncomplicated: Secondary | ICD-10-CM | POA: Diagnosis not present

## 2017-09-18 DIAGNOSIS — Z79899 Other long term (current) drug therapy: Secondary | ICD-10-CM | POA: Diagnosis not present

## 2017-09-18 LAB — COMPREHENSIVE METABOLIC PANEL
ALBUMIN: 3.9 g/dL (ref 3.5–5.0)
ALT: 27 U/L (ref 14–54)
ANION GAP: 10 (ref 5–15)
AST: 23 U/L (ref 15–41)
Alkaline Phosphatase: 56 U/L (ref 38–126)
BILIRUBIN TOTAL: 0.5 mg/dL (ref 0.3–1.2)
BUN: 18 mg/dL (ref 6–20)
CHLORIDE: 107 mmol/L (ref 101–111)
CO2: 22 mmol/L (ref 22–32)
Calcium: 8.7 mg/dL — ABNORMAL LOW (ref 8.9–10.3)
Creatinine, Ser: 0.82 mg/dL (ref 0.44–1.00)
GFR calc Af Amer: >60 mL/min (ref >60)
GFR calc non Af Amer: >60 mL/min (ref >60)
GLUCOSE: 118 mg/dL — AB (ref 65–99)
POTASSIUM: 3.8 mmol/L (ref 3.5–5.1)
SODIUM: 139 mmol/L (ref 135–145)
TOTAL PROTEIN: 7.3 g/dL (ref 6.5–8.1)

## 2017-09-18 LAB — CBC WITH DIFFERENTIAL/PLATELET
BASOS ABS: 0.1 10*3/uL (ref 0–0.1)
BASOS PCT: 1 %
EOS ABS: 0.4 10*3/uL (ref 0–0.7)
Eosinophils Relative: 3 %
HEMATOCRIT: 41.4 % (ref 35.0–47.0)
Hemoglobin: 14.8 g/dL (ref 12.0–16.0)
Lymphocytes Relative: 30 %
Lymphs Abs: 3.1 10*3/uL (ref 1.0–3.6)
MCH: 31.7 pg (ref 26.0–34.0)
MCHC: 35.7 g/dL (ref 32.0–36.0)
MCV: 88.8 fL (ref 80.0–100.0)
MONO ABS: 1.4 10*3/uL — AB (ref 0.2–0.9)
Monocytes Relative: 13 %
NEUTROS ABS: 5.4 10*3/uL (ref 1.4–6.5)
NEUTROS PCT: 53 %
Platelets: 361 10*3/uL (ref 150–440)
RBC: 4.67 MIL/uL (ref 3.80–5.20)
RDW: 12.8 % (ref 11.5–14.5)
WBC: 10.4 10*3/uL (ref 3.6–11.0)

## 2017-09-18 LAB — LIPASE, BLOOD: Lipase: 46 U/L (ref 11–51)

## 2017-09-18 MED ORDER — MORPHINE SULFATE (PF) 4 MG/ML IV SOLN
INTRAVENOUS | Status: AC
  Administered 2017-09-18: 4 mg via INTRAVENOUS
  Filled 2017-09-18: qty 1

## 2017-09-18 MED ORDER — ONDANSETRON HCL 4 MG/2ML IJ SOLN
INTRAMUSCULAR | Status: AC
  Filled 2017-09-18: qty 2

## 2017-09-18 MED ORDER — HYDROMORPHONE HCL 1 MG/ML IJ SOLN
1.0000 mg | Freq: Once | INTRAMUSCULAR | Status: AC
  Administered 2017-09-18: 1 mg via INTRAVENOUS

## 2017-09-18 MED ORDER — ONDANSETRON HCL 4 MG/2ML IJ SOLN
INTRAMUSCULAR | Status: AC
  Administered 2017-09-18: 4 mg via INTRAVENOUS
  Filled 2017-09-18: qty 2

## 2017-09-18 MED ORDER — ONDANSETRON 4 MG PO TBDP: 4.0000 mg | ORAL_TABLET | Freq: Three times a day (TID) | ORAL | 0 refills | Status: DC | PRN

## 2017-09-18 MED ORDER — OXYCODONE-ACETAMINOPHEN 7.5-325 MG PO TABS: 1.0000 | ORAL_TABLET | Freq: Three times a day (TID) | ORAL | 0 refills | Status: DC | PRN

## 2017-09-18 MED ORDER — SODIUM CHLORIDE 0.9 % IV BOLUS
1000.0000 mL | Freq: Once | INTRAVENOUS | Status: AC
  Administered 2017-09-18: 1000 mL via INTRAVENOUS

## 2017-09-18 MED ORDER — MORPHINE SULFATE (PF) 4 MG/ML IV SOLN
4.0000 mg | Freq: Once | INTRAVENOUS | Status: AC
  Administered 2017-09-18: 4 mg via INTRAVENOUS

## 2017-09-18 MED ORDER — OXYCODONE-ACETAMINOPHEN 5-325 MG PO TABS
1.0000 | ORAL_TABLET | Freq: Once | ORAL | Status: AC
  Administered 2017-09-18: 1 via ORAL
  Filled 2017-09-18: qty 1

## 2017-09-18 MED ORDER — ONDANSETRON HCL 4 MG/2ML IJ SOLN
4.0000 mg | Freq: Once | INTRAMUSCULAR | Status: AC
  Administered 2017-09-18: 4 mg via INTRAVENOUS

## 2017-09-18 MED ORDER — HYDROMORPHONE HCL 1 MG/ML IJ SOLN
INTRAMUSCULAR | Status: AC
  Filled 2017-09-18: qty 1

## 2017-09-18 NOTE — ED Notes (Signed)
C/O nausea.  Dr. Manson Passey alerted, zofran ordered and given.

## 2017-09-18 NOTE — ED Notes (Signed)
Patient transported to Ultrasound 

## 2017-09-18 NOTE — ED Provider Notes (Signed)
[facility] Emergency Department Provider Note   First MD Initiated Contact with Patient 09/18/17 0507     (approximate)  I have reviewed the triage vital signs and the nursing notes.   HISTORY  Chief Complaint Abdominal Pain    HPI Carol [name] is a 32 y.o. female with below list of chronic medical conditions including gallstones presents to the emergency department with history of intermittent right upper quadrant abdominal pain with decreased p.o. intake secondary to pain.  Patient states current pain score is 10 out of 10 and worse with any food ingestion.  Patient admits to nausea and vomiting.  Patient denies any constipation or fever.  Patient denies any urinary symptoms.  Patient denies any yellowing of the skin or eyes   Past Medical History:  Diagnosis Date  . ADHD   . Anxiety   . Asthma   . Bronchitis   . CNS disorder   . Depression   . Drug-seeking behavior   . Fatty liver disease, nonalcoholic   . Gall stones   . Gall stones   . Genital warts   . History of kidney stones   . Hypertension   . Increased frequency of headaches   . Irritable bowel   . Kidney stones   . Major depressive disorder, recurrent severe without psychotic features (HCC)   . Migraines   . Urinary tract infection     Patient Active Problem List   Diagnosis Date Noted  . Pelvic pain in female 12/19/2016  . Condyloma acuminatum of vulva 10/22/2016  . MDD (major depressive disorder), recurrent severe, without psychosis (HCC) 11/01/2013  . 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 Borderline behavior 05/08/2012  . ADHD (attention deficit hyperactivity disorder) 05/08/2012  . Insomnia secondary to depression with anxiety 05/08/2012    Past Surgical History:  Procedure Laterality Date  . CYSTOSCOPY/URETEROSCOPY/HOLMIUM LASER/STENT PLACEMENT Left 08/01/2017   Procedure: CYSTOSCOPY/URETEROSCOPY/HOLMIUM LASER/STENT PLACEMENT;  Surgeon: Vanna Scotland, MD;  Location: [facility]  ORS;  Service: Urology;  Laterality: Left;  . NO PAST SURGERIES      Prior to Admission medications   Medication Sig Start Date End Date Taking? Authorizing Provider  ALPRAZolam Prudy Feeler) 1 MG tablet Take 1 mg by mouth 3 (three) times daily as needed for anxiety.     [provider]  docusate sodium (COLACE) 100 MG capsule Take 1 capsule (100 mg total) by mouth 2 (two) times daily. 08/01/17   Vanna Scotland, MD  doxycycline (VIBRAMYCIN) 100 MG capsule Take 1 capsule (100 mg total) by mouth 2 (two) times daily. Patient not taking: Reported on 07/29/2017 07/19/17   Ward, Chase Picket, PA-C  HYDROcodone-acetaminophen (NORCO/VICODIN) 5-325 MG tablet Take 1-2 tablets by mouth every 6 (six) hours as needed for moderate pain. 08/01/17   Vanna Scotland, MD  ondansetron (ZOFRAN ODT) 4 MG disintegrating tablet Take 1 tablet (4 mg total) by mouth every 8 (eight) hours as needed for nausea or vomiting. Patient not taking: Reported on 08/01/2017 07/27/17   Nita Sickle, MD  oxybutynin (DITROPAN) 5 MG tablet Take 1 tablet (5 mg total) by mouth every 8 (eight) hours as needed for bladder spasms. 08/01/17   Vanna Scotland, MD  oxyCODONE-acetaminophen (PERCOCET) 7.5-325 MG tablet Take 1 tablet by mouth 3 (three) times daily.    [provider]  tamsulosin (FLOMAX) 0.4 MG CAPS capsule Take 1 capsule (0.4 mg total) by mouth daily. Patient not taking: Reported on 07/29/2017 07/24/17   Riki Altes, MD  tiZANidine (ZANAFLEX)  4 MG capsule Take 4 mg by mouth 3 (three) times daily as needed for muscle spasms.     [provider]    Allergies Sulfa antibiotics; Pseudoephedrine; Anesthesia s-i-60; Cephalosporins; Ciprocin-fluocin-procin [fluocinolone acetonide]; Ciprofloxacin; Compazine [prochlorperazine]; Flagyl [metronidazole]; Hydrochlorothiazide; Keflex [cephalexin]; and Penicillins  Family History  Problem Relation Age of Onset  . Physical abuse Mother   . Anxiety disorder Mother   .  ADD / ADHD Mother   . Paranoid behavior Mother   . Drug abuse Father   . Depression Father   . Alcohol abuse Maternal Grandfather   . ADD / ADHD Maternal Grandmother   . Anxiety disorder Maternal Grandmother   . Dementia Maternal Grandmother   . OCD Maternal Grandmother   . Sexual abuse Maternal Grandmother   . Alcohol abuse Paternal Grandfather   . Bipolar disorder Cousin   . Schizophrenia Neg Hx   . Seizures Neg Hx     Social History Social History   Tobacco Use  . Smoking status: Current Every Day Smoker    Packs/day: 0.25    Types: Cigarettes  . Smokeless tobacco: Never Used  Substance Use Topics  . Alcohol use: No  . Drug use: No    Review of Systems Constitutional: No fever/chills Eyes: No visual changes. ENT: No sore throat. Cardiovascular: Denies chest pain. Respiratory: Denies shortness of breath. Gastrointestinal: Positive for abdominal pain nausea and vomiting no diarrhea.  No constipation. Genitourinary: Negative for dysuria. Musculoskeletal: Negative for neck pain.  Negative for back pain. Integumentary: Negative for rash. Neurological: Negative for headaches, focal weakness or numbness.   ____________________________________________   PHYSICAL EXAM:  VITAL SIGNS: ED Triage Vitals  Enc Vitals Group     BP 09/18/17 0444 (!) 140/111     Pulse Rate 09/18/17 0444 91     Resp 09/18/17 0444 20     Temp 09/18/17 0444 98.1 F (36.7 C)     Temp Source 09/18/17 0444 Oral     SpO2 09/18/17 0444 99 %     Weight 09/18/17 0446 108.9 kg (240 lb)     Height 09/18/17 0446 1.651 m ( )     Head Circumference --      Peak Flow --      Pain Score 09/18/17 0445 10     Pain Loc --      Pain Edu? --      Excl. in GC? --     Constitutional: Apparent discomfort Eyes: Conjunctivae are normal.  Head: Atraumatic. Mouth/Throat: Mucous membranes are moist.  Oropharynx non-erythematous. Neck: No stridor.  Cardiovascular: Normal rate, regular rhythm. Good  peripheral circulation. Grossly normal heart sounds. Respiratory: Normal respiratory effort.  No retractions. Lungs CTAB. Gastrointestinal: Right upper quadrant tenderness to palpation. No distention.  Musculoskeletal: No lower extremity tenderness nor edema. No gross deformities of extremities. Neurologic:  Normal speech and language. No gross focal neurologic deficits are appreciated.  Skin:  Skin is warm, dry and intact. No rash noted. Psychiatric: Mood and affect are normal. Speech and behavior are normal.  ____________________________________________   LABS (all labs ordered are listed, but only abnormal results are displayed)  Labs Reviewed  CBC WITH DIFFERENTIAL/PLATELET - Abnormal; Notable for the following components:      Result Value   Monocytes Absolute 1.4 (*)    All other components within normal limits  COMPREHENSIVE METABOLIC PANEL - Abnormal; Notable for the following components:   Glucose, Bld 118 (*)    Calcium 8.7 (*)  All other components within normal limits  LIPASE, BLOOD     RADIOLOGY I, Laurium N BROWN, personally viewed and evaluated these images (plain radiographs) as part of my medical decision making, as well as reviewing the written report by the radiologist.    Official radiology report(s): US Abdomen Limited Ruq  Result Date: 09/18/2017 CLINICAL DATA:  Right upper quadrant pain. EXAM: ULTRASOUND ABDOMEN LIMITED RIGHT UPPER QUADRANT COMPARISON:  03/23/2016 FINDINGS: Gallbladder: Mild gallbladder distention with multiple gallstones. Majority of the stones are mobile, however are non mobile stones noted in the gallbladder neck. No gallbladder wall thickening. No pericholecystic fluid. No sonographic Murphy sign noted by sonographer. Common bile duct: Diameter: 3 mm, normal. Liver: No focal lesion identified. Mild diffuse increase in parenchymal echogenicity. Portal vein is patent on color Doppler imaging with normal direction of blood flow towards the  liver. IMPRESSION: 1. Cholelithiasis including non mobile stones in the gallbladder neck. No secondary sonographic findings of acute cholecystitis. 2. Hepatic steatosis. Electronically Signed   By: Rubye Oaks M.D.   On: 09/18/2017 05:54      Procedures   ____________________________________________   INITIAL IMPRESSION / ASSESSMENT AND PLAN / ED COURSE  As part of my medical decision making, I reviewed the following data within the electronic MEDICAL RECORD NUMBER  32 year old female present with above-stated history and physical exam concerning for possible cholelithiasis versus choledocholithiasis versus cholecystitis.  Patient given IV morphine 4 mg with minimal improvement of pain and as such IV Dilaudid 1 mg was given with resolution of pain.  Ultrasound was performed which revealed above-stated cholelithiasis with possible stones in gallbladder neck.  Patient discussed with Dr. Everlene Farrier general surgeon on-call.  Dr. Earlene Plater presents to the emergency department and recommended outpatient follow-up.  Given control of pain with normal liver enzymes absence of cholecystitis patient will be discharged home with outpatient follow-up ____________________________________________  FINAL CLINICAL IMPRESSION(S) / ED DIAGNOSES  Final diagnoses:  RUQ pain  Gallstones     MEDICATIONS GIVEN DURING THIS VISIT:  Medications  ondansetron (ZOFRAN) injection 4 mg (4 mg Intravenous Given 09/18/17 0508)  morphine 4 MG/ML injection 4 mg (4 mg Intravenous Given 09/18/17 0511)     ED Discharge Orders    None       Note:  This document was prepared using Dragon voice recognition software and may include unintentional dictation errors.    Darci Current, MD 09/18/17 [phone]

## 2017-09-18 NOTE — ED Triage Notes (Signed)
Patient to ER for c/o severe abd pain to epigastric region for approx 30 mins (states it woke her from sleep). Patient denies any known fevers. +N/V/D (diarrhea for approx one week). Patient states she just ate tonight for first time in last couple of days, but did not eat anything spicy. Patient denies anyone else around her being sick. States pain goes into mid to upper back as well. Patient actively vomiting during triage.

## 2018-01-29 ENCOUNTER — Emergency Department
Admission: EM | Admit: 2018-01-29 | Discharge: 2018-01-29 | Disposition: A | Discharge status: Home / Self Care | Payer: Self-pay | Attending: Emergency Medicine | Admitting: Emergency Medicine

## 2018-01-29 ENCOUNTER — Emergency Department: Payer: Self-pay

## 2018-01-29 ENCOUNTER — Other Ambulatory Visit: Payer: Self-pay

## 2018-01-29 DIAGNOSIS — R079 Chest pain, unspecified: Secondary | ICD-10-CM | POA: Insufficient documentation

## 2018-01-29 DIAGNOSIS — R002 Palpitations: Secondary | ICD-10-CM | POA: Insufficient documentation

## 2018-01-29 DIAGNOSIS — F17210 Nicotine dependence, cigarettes, uncomplicated: Secondary | ICD-10-CM | POA: Insufficient documentation

## 2018-01-29 DIAGNOSIS — F1210 Cannabis abuse, uncomplicated: Secondary | ICD-10-CM | POA: Insufficient documentation

## 2018-01-29 DIAGNOSIS — Z79899 Other long term (current) drug therapy: Secondary | ICD-10-CM | POA: Insufficient documentation

## 2018-01-29 DIAGNOSIS — J45909 Unspecified asthma, uncomplicated: Secondary | ICD-10-CM | POA: Insufficient documentation

## 2018-01-29 LAB — URINE DRUG SCREEN, QUALITATIVE (ARMC ONLY)
Amphetamines, Ur Screen: NOT DETECTED
Barbiturates, Ur Screen: NOT DETECTED
Benzodiazepine, Ur Scrn: NOT DETECTED
CANNABINOID 50 NG, UR ~~LOC~~: POSITIVE — AB
Cocaine Metabolite,Ur ~~LOC~~: NOT DETECTED
MDMA (Ecstasy)Ur Screen: NOT DETECTED
METHADONE SCREEN, URINE: NOT DETECTED
Opiate, Ur Screen: POSITIVE — AB
Phencyclidine (PCP) Ur S: NOT DETECTED
TRICYCLIC, UR SCREEN: NOT DETECTED

## 2018-01-29 LAB — COMPREHENSIVE METABOLIC PANEL
ALBUMIN: 4.2 g/dL (ref 3.5–5.0)
ALK PHOS: 53 U/L (ref 38–126)
ALT: 26 U/L (ref 0–44)
ANION GAP: 8 (ref 5–15)
AST: 22 U/L (ref 15–41)
BILIRUBIN TOTAL: 0.4 mg/dL (ref 0.3–1.2)
BUN: 11 mg/dL (ref 6–20)
CO2: 29 mmol/L (ref 22–32)
Calcium: 8.9 mg/dL (ref 8.9–10.3)
Chloride: 103 mmol/L (ref 98–111)
Creatinine, Ser: 0.81 mg/dL (ref 0.44–1.00)
GFR calc Af Amer: >60 mL/min (ref >60)
GFR calc non Af Amer: >60 mL/min (ref >60)
GLUCOSE: 126 mg/dL — AB (ref 70–99)
Potassium: 3.6 mmol/L (ref 3.5–5.1)
SODIUM: 140 mmol/L (ref 135–145)
TOTAL PROTEIN: 7.4 g/dL (ref 6.5–8.1)

## 2018-01-29 LAB — POCT PREGNANCY, URINE: Preg Test, Ur: NEGATIVE

## 2018-01-29 LAB — FIBRIN DERIVATIVES D-DIMER (ARMC ONLY): FIBRIN DERIVATIVES D-DIMER (ARMC): 126.35 ng{FEU}/mL (ref 0.00–499.00)

## 2018-01-29 LAB — URINALYSIS, COMPLETE (UACMP) WITH MICROSCOPIC
Bacteria, UA: NONE SEEN
Bilirubin Urine: NEGATIVE
GLUCOSE, UA: NEGATIVE mg/dL
HGB URINE DIPSTICK: NEGATIVE
Ketones, ur: NEGATIVE mg/dL
LEUKOCYTES UA: NEGATIVE
NITRITE: NEGATIVE
PH: 5 (ref 5.0–8.0)
Protein, ur: NEGATIVE mg/dL
SPECIFIC GRAVITY, URINE: 1.018 (ref 1.005–1.030)

## 2018-01-29 LAB — LIPASE, BLOOD: Lipase: 36 U/L (ref 11–51)

## 2018-01-29 LAB — CBC
HEMATOCRIT: 42.3 % (ref 35.0–47.0)
HEMOGLOBIN: 14.7 g/dL (ref 12.0–16.0)
MCH: 31.5 pg (ref 26.0–34.0)
MCHC: 34.6 g/dL (ref 32.0–36.0)
MCV: 90.9 fL (ref 80.0–100.0)
Platelets: 350 10*3/uL (ref 150–440)
RBC: 4.66 MIL/uL (ref 3.80–5.20)
RDW: 12.8 % (ref 11.5–14.5)
WBC: 12.1 10*3/uL — AB (ref 3.6–11.0)

## 2018-01-29 LAB — T4, FREE: FREE T4: 0.96 ng/dL (ref 0.82–1.77)

## 2018-01-29 LAB — TROPONIN I: Troponin I: <0.03 ng/mL (ref <0.03)

## 2018-01-29 LAB — TSH: TSH: 3.813 u[IU]/mL (ref 0.350–4.500)

## 2018-01-29 NOTE — ED Triage Notes (Addendum)
Pt in with chest fluttering states has had symptoms for few weeks but never seen md. Pt denies any cardiac history or recent illness. States does feels shob at times.

## 2018-01-29 NOTE — ED Notes (Signed)
Pt states has had intermittent palpitations for a month. Pt states is worse at night. Pt denies excessive caffiene use, but does smoke. Pt denies other stimulant use.

## 2018-01-29 NOTE — ED Notes (Signed)
Pt updated on turn around time for labs. Pt verbalizes understanding, lights dimmed for comfort.

## 2018-01-29 NOTE — Discharge Instructions (Addendum)
Avoid caffeine as much as possible. Return to the ER for worsening symptoms, persistent vomiting, difficulty breathing or other concerns.

## 2018-01-29 NOTE — ED Notes (Signed)
Report to ashley, rn

## 2018-01-29 NOTE — ED Provider Notes (Signed)
[facility] Emergency Department Provider Note   ____________________________________________   First MD Initiated Contact with Patient 01/29/18 [phone]     (approximate)  I have reviewed the triage vital signs and the nursing notes.   HISTORY  Chief Complaint Chest Pain    HPI Carol [name] is a 32 y.o. female who presents to the ED from home with a chief complaint of chest discomfort and palpitations.  Patient reports symptoms waxing/waning over the past 2 months.  She is here tonight because symptoms have increased in frequency but not duration.  She is now experiencing them almost nightly.  States she has "drastically cut down on my Dr. Alcus Dad".  States she virtually now drinks no soda.  Just one cup of coffee per day and on average 24 ounces of sweet tea.  Denies illicit stimulant drug use, energy drinks or herbal medications.  Denies fever, chills, cough, congestion, shortness of breath, abdominal pain, nausea or vomiting.  Denies recent travel, trauma or hormone use.   Past Medical History:  Diagnosis Date  . ADHD   . Anxiety   . Asthma   . Bronchitis   . CNS disorder   . Depression   . Drug-seeking behavior   . Fatty liver disease, nonalcoholic   . Gall stones   . Gall stones   . Genital warts   . History of kidney stones   . Hypertension   . Increased frequency of headaches   . Irritable bowel   . Kidney stones   . Major depressive disorder, recurrent severe without psychotic features (HCC)   . Migraines   . Urinary tract infection     Patient Active Problem List   Diagnosis Date Noted  . Pelvic pain in female 12/19/2016  . Condyloma acuminatum of vulva 10/22/2016  . MDD (major depressive disorder), recurrent severe, without psychosis (HCC) 11/01/2013  . 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 Borderline behavior 05/08/2012  . ADHD (attention deficit hyperactivity disorder) 05/08/2012  . Insomnia secondary to depression with anxiety  05/08/2012    Past Surgical History:  Procedure Laterality Date  . CYSTOSCOPY/URETEROSCOPY/HOLMIUM LASER/STENT PLACEMENT Left 08/01/2017   Procedure: CYSTOSCOPY/URETEROSCOPY/HOLMIUM LASER/STENT PLACEMENT;  Surgeon: Vanna Scotland, MD;  Location: [facility] ORS;  Service: Urology;  Laterality: Left;  . NO PAST SURGERIES      Prior to Admission medications   Medication Sig Start Date End Date Taking? Authorizing Provider  ALPRAZolam Prudy Feeler) 1 MG tablet Take 1 mg by mouth 3 (three) times daily as needed for anxiety.     [provider]  docusate sodium (COLACE) 100 MG capsule Take 1 capsule (100 mg total) by mouth 2 (two) times daily. 08/01/17   Vanna Scotland, MD  doxycycline (VIBRAMYCIN) 100 MG capsule Take 1 capsule (100 mg total) by mouth 2 (two) times daily. Patient not taking: Reported on 07/29/2017 07/19/17   Ward, Chase Picket, PA-C  HYDROcodone-acetaminophen (NORCO/VICODIN) 5-325 MG tablet Take 1-2 tablets by mouth every 6 (six) hours as needed for moderate pain. 08/01/17   Vanna Scotland, MD  ondansetron (ZOFRAN ODT) 4 MG disintegrating tablet Take 1 tablet (4 mg total) by mouth every 8 (eight) hours as needed for nausea or vomiting. Patient not taking: Reported on 08/01/2017 07/27/17   Nita Sickle, MD  ondansetron (ZOFRAN ODT) 4 MG disintegrating tablet Take 1 tablet (4 mg total) by mouth every 8 (eight) hours as needed for nausea or vomiting. 09/18/17   Darci Current, MD  oxybutynin (DITROPAN) 5 MG tablet Take  1 tablet (5 mg total) by mouth every 8 (eight) hours as needed for bladder spasms. 08/01/17   Vanna Scotland, MD  oxyCODONE-acetaminophen (PERCOCET) 7.5-325 MG tablet Take 1 tablet by mouth 3 (three) times daily.    [provider]  oxyCODONE-acetaminophen (PERCOCET) 7.5-325 MG tablet Take 1 tablet by mouth every 8 (eight) hours as needed for severe pain. 09/18/17   Darci Current, MD  tamsulosin (FLOMAX) 0.4 MG CAPS capsule Take 1 capsule (0.4 mg total) by mouth  daily. Patient not taking: Reported on 07/29/2017 07/24/17   Riki Altes, MD  tiZANidine (ZANAFLEX) 4 MG capsule Take 4 mg by mouth 3 (three) times daily as needed for muscle spasms.     [provider]    Allergies Sulfa antibiotics; Pseudoephedrine; Anesthesia s-i-60; Cephalosporins; Ciprocin-fluocin-procin [fluocinolone acetonide]; Ciprofloxacin; Compazine [prochlorperazine]; Flagyl [metronidazole]; Hydrochlorothiazide; Keflex [cephalexin]; and Penicillins  Family History  Problem Relation Age of Onset  . Physical abuse Mother   . Anxiety disorder Mother   . ADD / ADHD Mother   . Paranoid behavior Mother   . Drug abuse Father   . Depression Father   . Alcohol abuse Maternal Grandfather   . ADD / ADHD Maternal Grandmother   . Anxiety disorder Maternal Grandmother   . Dementia Maternal Grandmother   . OCD Maternal Grandmother   . Sexual abuse Maternal Grandmother   . Alcohol abuse Paternal Grandfather   . Bipolar disorder Cousin   . Schizophrenia Neg Hx   . Seizures Neg Hx     Social History Social History   Tobacco Use  . Smoking status: Current Every Day Smoker    Packs/day: 0.25    Types: Cigarettes  . Smokeless tobacco: Never Used  Substance Use Topics  . Alcohol use: No  . Drug use: No    Review of Systems  Constitutional: No fever/chills Eyes: No visual changes. ENT: No sore throat. Cardiovascular: Positive for palpitations and chest pain. Respiratory: Denies shortness of breath. Gastrointestinal: No abdominal pain.  No nausea, no vomiting.  No diarrhea.  No constipation. Genitourinary: Negative for dysuria. Musculoskeletal: Negative for back pain. Skin: Negative for rash. Neurological: Negative for headaches, focal weakness or numbness.   ____________________________________________   PHYSICAL EXAM:  VITAL SIGNS: ED Triage Vitals  Enc Vitals Group     BP 01/29/18 0203 (!) 142/97     Pulse Rate 01/29/18 0203 89     Resp 01/29/18 0203  18     Temp 01/29/18 0203 98.6 F (37 C)     Temp Source 01/29/18 0203 Oral     SpO2 01/29/18 0203 100 %     Weight 01/29/18 0200 218 lb (98.9 kg)     Height --      Head Circumference --      Peak Flow --      Pain Score 01/29/18 0200 6     Pain Loc --      Pain Edu? --      Excl. in GC? --     Constitutional: Alert and oriented. Well appearing and in no acute distress. Eyes: Conjunctivae are normal. PERRL. EOMI. Head: Atraumatic. Nose: No congestion/rhinnorhea. Mouth/Throat: Mucous membranes are moist.  Oropharynx non-erythematous. Neck: No stridor.   Cardiovascular: Normal rate, regular rhythm. Grossly normal heart sounds.  Good peripheral circulation. Respiratory: Normal respiratory effort.  No retractions. Lungs CTAB. Gastrointestinal: Soft and nontender. No distention. No abdominal bruits. No CVA tenderness. Musculoskeletal: No lower extremity tenderness nor edema.  No calf tenderness  bilaterally.  No joint effusions. Neurologic:  Normal speech and language. No gross focal neurologic deficits are appreciated. No gait instability. Skin:  Skin is warm, dry and intact. No rash noted. Psychiatric: Mood and affect are normal. Speech and behavior are normal.  ____________________________________________   LABS (all labs ordered are listed, but only abnormal results are displayed)  Labs Reviewed  CBC - Abnormal; Notable for the following components:      Result Value   WBC 12.1 (*)    All other components within normal limits  COMPREHENSIVE METABOLIC PANEL - Abnormal; Notable for the following components:   Glucose, Bld 126 (*)    All other components within normal limits  URINE DRUG SCREEN, QUALITATIVE ([facility] ONLY) - Abnormal; Notable for the following components:   Opiate, Ur Screen POSITIVE (*)    Cannabinoid 50 Ng, Ur [place] POSITIVE (*)    All other components within normal limits  URINALYSIS, COMPLETE (UACMP) WITH MICROSCOPIC - Abnormal; Notable for the following  components:   Color, Urine YELLOW (*)    APPearance HAZY (*)    All other components within normal limits  TROPONIN I  LIPASE, BLOOD  TROPONIN I  FIBRIN DERIVATIVES D-DIMER ([facility] ONLY)  TSH  T4, FREE  POC URINE PREG, ED  POCT PREGNANCY, URINE   ____________________________________________  EKG  ED ECG REPORT I, Kenzlie Disch J, the attending physician, personally viewed and interpreted this ECG.   Date: 01/29/2018  EKG Time: 0203  Rate: 86  Rhythm: normal EKG, normal sinus rhythm  Axis: Normal  Intervals:none  ST&T Change: Nonspecific  ____________________________________________  RADIOLOGY  ED MD interpretation: No acute cardiopulmonary process  Official radiology report(s): Dg Chest 2 View  Result Date: 01/29/2018 CLINICAL DATA:  Palpitations EXAM: CHEST - 2 VIEW COMPARISON:  07/20/2017 FINDINGS: Lungs are clear.  No pleural effusion or pneumothorax. The heart is normal in size. Visualized osseous structures are within normal limits. IMPRESSION: Normal chest radiographs. Electronically Signed   By: Charline Bills M.D.   On: 01/29/2018 02:23    ____________________________________________   PROCEDURES  Procedure(s) performed: None  Procedures  Critical Care performed: No  ____________________________________________   INITIAL IMPRESSION / ASSESSMENT AND PLAN / ED COURSE  As part of my medical decision making, I reviewed the following data within the electronic MEDICAL RECORD NUMBER Nursing notes reviewed and incorporated, Labs reviewed, EKG interpreted, Old chart reviewed, Radiograph reviewed and Notes from prior ED visits   33 year old female who presents with a 78-month history of palpitations and chest discomfort. Differential diagnosis includes, but is not limited to, ACS, aortic dissection, pulmonary embolism, cardiac tamponade, pneumothorax, pneumonia, pericarditis, myocarditis, GI-related causes including esophagitis/gastritis, and musculoskeletal chest  wall pain.    Initial EKG and troponin unremarkable.  Will check lipase, thyroid panel, d-dimer, timed troponin, UDS.   Clinical Course as of Jan 30 719  Wed Jan 29, 2018  0715 Updated patient of negative repeat troponin, unremarkable Ddimer and normal thyroid panel. Will refer to cardiology for probably Holter monitoring. Strict return precautions given. Patient verbalizes understanding and agrees with plan of care.   [JS]    Clinical Course User Index [JS] Irean Hong, MD     ____________________________________________   FINAL CLINICAL IMPRESSION(S) / ED DIAGNOSES  Final diagnoses:  Nonspecific chest pain  Heart palpitations  Marijuana abuse     ED Discharge Orders    None       Note:  This document was prepared using Dragon voice recognition software and may include  unintentional dictation errors.    Irean Hong, MD 01/29/18 [phone]

## 2018-02-10 ENCOUNTER — Other Ambulatory Visit: Payer: Self-pay

## 2018-02-18 LAB — CYTOLOGY - PAP: HPV: DETECTED

## 2018-03-07 ENCOUNTER — Telehealth (HOSPITAL_COMMUNITY): Payer: Self-pay | Admitting: *Deleted

## 2018-03-07 NOTE — Telephone Encounter (Signed)
Attempted to call patient to discuss Pap smear results. No one answered phone. Left voicemail for patient to call me back. 

## 2018-03-12 ENCOUNTER — Encounter (HOSPITAL_COMMUNITY): Payer: Self-pay | Admitting: *Deleted

## 2018-03-12 ENCOUNTER — Telehealth (HOSPITAL_COMMUNITY): Payer: Self-pay | Admitting: *Deleted

## 2018-03-12 NOTE — Telephone Encounter (Signed)
Patient returned my phone call. Explained to patient that her Pap smear result was abnormal. Explained to patient that a colposcopy is recommended for follow-up. Patient stated she has a history of two colposcopies and a LEEP a year ago. Patient has no insurance. Told patient about the [facility] and patient is eligible. Scheduled patient appointment with [facility] for 04/01/2018. Patient verbalized understanding.

## 2018-03-12 NOTE — Telephone Encounter (Signed)
Patient returned my phone call. Attempted to call patient back. No one answered the phone. Left voicemail for patient to call me back.

## 2018-03-31 ENCOUNTER — Other Ambulatory Visit: Payer: Self-pay

## 2018-03-31 ENCOUNTER — Encounter: Payer: Self-pay | Admitting: Emergency Medicine

## 2018-03-31 DIAGNOSIS — Z5321 Procedure and treatment not carried out due to patient leaving prior to being seen by health care provider: Secondary | ICD-10-CM | POA: Insufficient documentation

## 2018-03-31 DIAGNOSIS — R197 Diarrhea, unspecified: Secondary | ICD-10-CM | POA: Insufficient documentation

## 2018-03-31 DIAGNOSIS — R109 Unspecified abdominal pain: Secondary | ICD-10-CM | POA: Insufficient documentation

## 2018-03-31 DIAGNOSIS — R112 Nausea with vomiting, unspecified: Secondary | ICD-10-CM | POA: Insufficient documentation

## 2018-03-31 LAB — COMPREHENSIVE METABOLIC PANEL
ALK PHOS: 60 U/L (ref 38–126)
ALT: 27 U/L (ref 0–44)
ANION GAP: 7 (ref 5–15)
AST: 22 U/L (ref 15–41)
Albumin: 4.4 g/dL (ref 3.5–5.0)
BUN: 11 mg/dL (ref 6–20)
CALCIUM: 9.3 mg/dL (ref 8.9–10.3)
CO2: 26 mmol/L (ref 22–32)
CREATININE: 0.72 mg/dL (ref 0.44–1.00)
Chloride: 110 mmol/L (ref 98–111)
Glucose, Bld: 132 mg/dL — ABNORMAL HIGH (ref 70–99)
Potassium: 3.6 mmol/L (ref 3.5–5.1)
Sodium: 143 mmol/L (ref 135–145)
TOTAL PROTEIN: 7.6 g/dL (ref 6.5–8.1)
Total Bilirubin: 0.5 mg/dL (ref 0.3–1.2)

## 2018-03-31 LAB — CBC
HCT: 45 % (ref 36.0–46.0)
Hemoglobin: 15.5 g/dL — ABNORMAL HIGH (ref 12.0–15.0)
MCH: 30.7 pg (ref 26.0–34.0)
MCHC: 34.4 g/dL (ref 30.0–36.0)
MCV: 89.1 fL (ref 80.0–100.0)
NRBC: 0 % (ref 0.0–0.2)
PLATELETS: 354 10*3/uL (ref 150–400)
RBC: 5.05 MIL/uL (ref 3.87–5.11)
RDW: 11.6 % (ref 11.5–15.5)
WBC: 10.1 10*3/uL (ref 4.0–10.5)

## 2018-03-31 LAB — URINALYSIS, COMPLETE (UACMP) WITH MICROSCOPIC
BILIRUBIN URINE: NEGATIVE
Glucose, UA: NEGATIVE mg/dL
Hgb urine dipstick: NEGATIVE
KETONES UR: NEGATIVE mg/dL
NITRITE: NEGATIVE
PH: 6 (ref 5.0–8.0)
PROTEIN: NEGATIVE mg/dL
Specific Gravity, Urine: 1.021 (ref 1.005–1.030)

## 2018-03-31 LAB — LIPASE, BLOOD: LIPASE: 38 U/L (ref 11–51)

## 2018-03-31 LAB — POCT PREGNANCY, URINE: PREG TEST UR: NEGATIVE

## 2018-03-31 NOTE — ED Triage Notes (Signed)
Pt here for bilateral flank pain. Hx of stones with lithotripsy.  + nausea and vomiting/diarrhea.  Pain to RUQ as well.  Will sometimes feel like heart beating fast. Denies chest pain.  Diarrhea happens after patient eats.  Unlabored. VSS.

## 2018-04-01 ENCOUNTER — Emergency Department
Admission: EM | Admit: 2018-04-01 | Discharge: 2018-04-01 | Disposition: A | Discharge status: Home / Self Care | Payer: Self-pay | Attending: Emergency Medicine | Admitting: Emergency Medicine

## 2018-04-01 ENCOUNTER — Ambulatory Visit (HOSPITAL_COMMUNITY): Payer: Self-pay

## 2018-04-01 ENCOUNTER — Telehealth: Payer: Self-pay | Admitting: Emergency Medicine

## 2018-04-01 NOTE — ED Notes (Signed)
No answer when called several times from lobby 

## 2018-04-01 NOTE — Telephone Encounter (Signed)
Called patient due to lwot to inquire about condition and follow up plans. Wrong number. 

## 2018-04-08 ENCOUNTER — Encounter (HOSPITAL_COMMUNITY): Payer: Self-pay

## 2018-04-08 ENCOUNTER — Ambulatory Visit (HOSPITAL_COMMUNITY)
Admission: RE | Admit: 2018-04-08 | Discharge: 2018-04-08 | Disposition: A | Discharge status: Home / Self Care | Payer: Self-pay | Source: Ambulatory Visit | Attending: Obstetrics and Gynecology | Admitting: Obstetrics and Gynecology

## 2018-04-08 VITALS — BP 130/72 | Ht 65.0 in | Wt 236.0 lb

## 2018-04-08 DIAGNOSIS — R87611 Atypical squamous cells cannot exclude high grade squamous intraepithelial lesion on cytologic smear of cervix (ASC-H): Secondary | ICD-10-CM

## 2018-04-08 DIAGNOSIS — Z1239 Encounter for other screening for malignant neoplasm of breast: Secondary | ICD-10-CM

## 2018-04-08 NOTE — Progress Notes (Signed)
Patient referred to [facility] due to having an abnormal Pap smearon 02/10/2018 at the free cervical cancer screening that was abnormal and a colposcopy is recommended for follow-up.  Patient complained of bilateral breast lumps x 2 years that was worked up 02/15/2016 and negative. Patient states she has not noticed any changes.  Pap Smear: Pap smear not completed today. Last Pap smear was 02/10/2018 at [facility] free [facility]. [facility] for a colposcopy to follow-up for her abnormal Pap smear. Appointment scheduled for Monday, April 14, 2018 at 1055. Per patient has a history of two other abnormal Pap smears. 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 Last Pap smear and colposcopy result from 09/04/2016 are in Epic.  Physical exam: Breasts Breasts symmetrical. No skin abnormalities bilateral breasts. No nipple retraction bilateral breasts. No nipple discharge bilateral breasts. No lymphadenopathy. No lumps palpated bilateral breasts. Unable to palpate any lumps in patients area of concern. Complaints of left lower breast tenderness on exam. Per patients bilateral breast ultrasound completed 02/15/2016 a screening mammogram is recommended at age 30 unless clinically indicated prior. Per patient no changes since bilateral breast ultrasound 02/15/2016.       Pelvic/Bimanual No Pap smear completed today since last Pap smear was 02/10/2018. Pap smear not indicated per [facility]CP guidelines.   Smoking History: Patient has never smoked.  Patient Navigation: Patient education provided. Access to services provided for patient through [facility] program.   Breast and Cervical Cancer Risk Assessment: Patient has no family history of breast  cancer, known genetic mutations, or radiation treatment to the chest before age 30. Patient has a history of cervical dysplasia. Patient has no history of being immunocompromised or DES exposure in-utero. Breast Cancer risk assessment completed. No breast cancer risk calculated due to patient is less than 30 years old.

## 2018-04-08 NOTE — Patient Instructions (Signed)
Explained breast self awareness with Carol [name]. Patient did not need a Pap smear today due to last Pap smear was 02/10/2018. Explained the colposcopy to patient the recommended follow-up for her abnormal Pap smear. [facility] for a colposcopy to follow-up for her abnormal Pap smear. Appointment scheduled for Monday, April 14, 2018 at 1055. Patient aware of appointment and will be there or call to reschedule. Carol [name] verbalized understanding.  Wardell Pokorski, Kathaleen Maserhristine Poll, RN 2:03 PM

## 2018-04-09 ENCOUNTER — Encounter (HOSPITAL_COMMUNITY): Payer: Self-pay | Admitting: *Deleted

## 2018-04-14 ENCOUNTER — Other Ambulatory Visit (HOSPITAL_COMMUNITY)
Admission: RE | Admit: 2018-04-14 | Discharge: 2018-04-14 | Disposition: A | Discharge status: Home / Self Care | Payer: Self-pay | Source: Ambulatory Visit | Attending: Obstetrics & Gynecology | Admitting: Obstetrics & Gynecology

## 2018-04-14 ENCOUNTER — Ambulatory Visit: Payer: Self-pay | Admitting: Obstetrics & Gynecology

## 2018-04-14 ENCOUNTER — Encounter: Payer: Self-pay | Admitting: Obstetrics & Gynecology

## 2018-04-14 VITALS — BP 159/112 | HR 97 | Wt 235.0 lb

## 2018-04-14 DIAGNOSIS — R87611 Atypical squamous cells cannot exclude high grade squamous intraepithelial lesion on cytologic smear of cervix (ASC-H): Secondary | ICD-10-CM | POA: Insufficient documentation

## 2018-04-14 LAB — POCT PREGNANCY, URINE: Preg Test, Ur: NEGATIVE

## 2018-04-14 NOTE — Patient Instructions (Signed)
Colposcopy, Care After  This sheet gives you information about how to care for yourself after your procedure. Your doctor may also give you more specific instructions. If you have problems or questions, contact your doctor.  What can I expect after the procedure?  If you did not have a tissue sample removed (did not have a biopsy), you may only have some spotting for a few days. You can go back to your normal activities.  If you had a tissue sample removed, it is common to have:  · Soreness and pain. This may last for a few days.  · Light-headedness.  · Mild bleeding from your vagina or dark-colored, grainy discharge from your vagina. This may last for a few days. You may need to wear a sanitary pad.  · Spotting for at least 48 hours after the procedure.    Follow these instructions at home:  · Take over-the-counter and prescription medicines only as told by your doctor. Ask your doctor what medicines you can start taking again. This is very important if you take blood-thinning medicine.  · Do not drive or use heavy machinery while taking prescription pain medicine.  · For 3 days, or as long as your doctor tells you, avoid:  ? Douching.  ? Using tampons.  ? Having sex.  · If you use birth control (contraception), keep using it.  · Limit activity for the first day after the procedure. Ask your doctor what activities are safe for you.  · It is up to you to get the results of your procedure. Ask your doctor when your results will be ready.  · Keep all follow-up visits as told by your doctor. This is important.  Contact a doctor if:  · You get a skin rash.  Get help right away if:  · You are bleeding a lot from your vagina. It is a lot of bleeding if you are using more than one pad an hour for 2 hours in a row.  · You have clumps of blood (blood clots) coming from your vagina.  · You have a fever.  · You have chills  · You have pain in your lower belly (pelvic area).  · You have signs of infection, such as vaginal  discharge that is:  ? Different than usual.  ? Yellow.  ? Bad-smelling.  · You have very pain or cramps in your lower belly that do not get better with medicine.  · You feel light-headed.  · You feel dizzy.  · You pass out (faint).  Summary  · If you did not have a tissue sample removed (did not have a biopsy), you may only have some spotting for a few days. You can go back to your normal activities.  · If you had a tissue sample removed, it is common to have mild pain and spotting for 48 hours.  · For 3 days, or as long as your doctor tells you, avoid douching, using tampons and having sex.  · Get help right away if you have bleeding, very bad pain, or signs of infection.  This information is not intended to replace advice given to you by your health care provider. Make sure you discuss any questions you have with your health care provider.  Document Released: 10/03/2007 Document Revised: 01/04/2016 Document Reviewed: 01/04/2016  Elsevier Interactive Patient Education © 2018 [employer]

## 2018-04-14 NOTE — Progress Notes (Signed)
Patient ID: Carol [name], female   DOB: 05-12-1985, 32 y.o.   MRN: [id number]  Chief Complaint  Patient presents with  . Colposcopy    HPI Carol MetzBrittany E [name] is a 32 y.o. female.  [id number]  HPI  Indications: Pap smear on October 2019 showed: ASC cannot exclude high grade lesion [facility]). Previous colposcopy: CIN 2 and in 2018. Prior cervical treatment: LLETZ. 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  Comment: NO INVASIVE PROCESS IDENTIFIED    Past Medical History:  Diagnosis Date  . ADHD   . Anxiety   . Asthma   . Bronchitis   . CNS disorder   . Depression   . Drug-seeking behavior   . Fatty liver disease, nonalcoholic   . Gall stones   . Gall stones   . Genital warts   . History of kidney stones   . Hypertension   . Increased frequency of headaches   . Irritable bowel   . Kidney stones   . Major depressive disorder, recurrent severe without psychotic features (HCC)   . Migraines   . Urinary tract infection     Past Surgical History:  Procedure Laterality Date  . CERVICAL BIOPSY  W/ LOOP ELECTRODE EXCISION    . CYSTOSCOPY/URETEROSCOPY/HOLMIUM LASER/STENT PLACEMENT Left 08/01/2017   Procedure: CYSTOSCOPY/URETEROSCOPY/HOLMIUM LASER/STENT PLACEMENT;  Surgeon: Vanna ScotlandBrandon, Ashley, MD;  Location: [facility] ORS;  Service: Urology;  Laterality: Left;  . NO PAST SURGERIES      Family History  Problem Relation Age of Onset  . Physical abuse Mother   . Anxiety disorder Mother   . ADD / ADHD Mother   . Paranoid behavior Mother   . Hypertension Mother   . Drug abuse Father   . Depression Father   . Alcohol abuse Maternal Grandfather   . ADD / ADHD Maternal Grandmother   . Anxiety disorder Maternal Grandmother   . Dementia Maternal Grandmother   . OCD Maternal Grandmother   . Sexual abuse Maternal  Grandmother   . Alcohol abuse Paternal Grandfather   . Bipolar disorder Cousin   . Schizophrenia Neg Hx   . Seizures Neg Hx     Social History Social History   Tobacco Use  . Smoking status: Light Tobacco Smoker    Packs/day: 0.25    Types: Cigarettes  . Smokeless tobacco: Never Used  Substance Use Topics  . Alcohol use: No  . Drug use: No    Allergies  Allergen Reactions  . Sulfa Antibiotics Hives and Shortness Of Breath  . Pseudoephedrine Palpitations and Other (See Comments)    sweats  . Anesthesia S-I-60     "Blood spots on my face"  . Cephalosporins Hives and Other (See Comments)    Can take Rocephin  . Ciprocin-Fluocin-Procin [Fluocinolone Acetonide] Hives  . Ciprofloxacin Hives  . Compazine [Prochlorperazine]   . Flagyl [Metronidazole] Other (See Comments)    Causes a greenish purple fuzz on tongue  . Hydrochlorothiazide Other (See Comments)    Sweaty, faint feeling  . Keflex [Cephalexin]   . Penicillins Hives and Other (See Comments)    Has patient had a PCN reaction causing immediate rash, facial/tongue/throat swelling, SOB or lightheadedness with hypotension: Yes Has patient had a PCN reaction causing severe rash involving mucus membranes or skin necrosis: No Has patient had a PCN reaction that required hospitalization Yes Has patient had a  PCN reaction occurring within the last 10 years: No If all of the above answers are "NO", then may proceed with Cephalosporin use.    Current Outpatient Medications  Medication Sig Dispense Refill  . ALPRAZolam (XANAX) 1 MG tablet Take 1 mg by mouth 3 (three) times daily as needed for anxiety.     Marland Kitchen oxyCODONE-acetaminophen (PERCOCET) 7.5-325 MG tablet Take 1 tablet by mouth 3 (three) times daily.    Marland Kitchen oxyCODONE-acetaminophen (PERCOCET) 7.5-325 MG tablet Take 1 tablet by mouth every 8 (eight) hours as needed for severe pain. 20 tablet 0  . tiZANidine (ZANAFLEX) 4 MG capsule Take 4 mg by mouth 3 (three) times daily as  needed for muscle spasms.     Marland Kitchen docusate sodium (COLACE) 100 MG capsule Take 1 capsule (100 mg total) by mouth 2 (two) times daily. (Patient not taking: Reported on 04/08/2018) 60 capsule 0  . doxycycline (VIBRAMYCIN) 100 MG capsule Take 1 capsule (100 mg total) by mouth 2 (two) times daily. (Patient not taking: Reported on 07/29/2017) 20 capsule 0  . HYDROcodone-acetaminophen (NORCO/VICODIN) 5-325 MG tablet Take 1-2 tablets by mouth every 6 (six) hours as needed for moderate pain. (Patient not taking: Reported on 04/14/2018) 10 tablet 0  . ondansetron (ZOFRAN ODT) 4 MG disintegrating tablet Take 1 tablet (4 mg total) by mouth every 8 (eight) hours as needed for nausea or vomiting. (Patient not taking: Reported on 08/01/2017) 20 tablet 0  . ondansetron (ZOFRAN ODT) 4 MG disintegrating tablet Take 1 tablet (4 mg total) by mouth every 8 (eight) hours as needed for nausea or vomiting. (Patient not taking: Reported on 04/08/2018) 20 tablet 0  . oxybutynin (DITROPAN) 5 MG tablet Take 1 tablet (5 mg total) by mouth every 8 (eight) hours as needed for bladder spasms. (Patient not taking: Reported on 04/08/2018) 30 tablet 0  . tamsulosin (FLOMAX) 0.4 MG CAPS capsule Take 1 capsule (0.4 mg total) by mouth daily. (Patient not taking: Reported on 07/29/2017) 7 capsule 0   No current facility-administered medications for this visit.     Review of Systems Review of Systems  Genitourinary: Negative for menstrual problem, pelvic pain and vaginal discharge.    Blood pressure (!) 159/112, pulse 97, weight 235 lb (106.6 kg), last menstrual period 03/21/2018.  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  Comment: NO INVASIVE PROCESS IDENTIFIED    Assessment    Procedure Details  The risks and benefits of the procedure  and Written informed consent obtained.  Speculum placed in vagina and excellent visualization of cervix achieved, cervix swabbed x 3 with acetic acid solution. Mild AWE at 10-11, os is tight, small sinus at 9 with mucus discharge, possible artifact after LEEP Specimens: ECC and bx at 10  Complications: none.     Plan    Specimens labelled and sent to Pathology. Return to discuss Pathology results in 2 weeks.      Scheryl Darter 04/14/2018, 11:47 AM

## 2018-04-21 ENCOUNTER — Telehealth: Payer: Self-pay

## 2018-04-21 NOTE — Telephone Encounter (Signed)
-----   Message from Adam PhenixJames G Arnold, MD sent at 04/21/2018 11:16 AM EST ----- CIN 1, recommend repeat pap in 12 months

## 2018-04-21 NOTE — Telephone Encounter (Signed)
Called pt, pt stated that she would like a second opinion on her pap results, states she is tired of being told to follow up in 1 year and she already had a LEEP and wants more option other than to just follow up. Pt requested an morning appointment with a MD. Carol BrackettSent pt mychart sign up and advised ill send her info to the front desk to be scheduled.

## 2018-04-21 NOTE — Telephone Encounter (Signed)
LVM that we are calling about results and to give the office a call back

## 2018-05-04 ENCOUNTER — Emergency Department: Payer: Self-pay

## 2018-05-04 ENCOUNTER — Other Ambulatory Visit: Payer: Self-pay

## 2018-05-04 ENCOUNTER — Observation Stay: Payer: Self-pay | Admitting: Anesthesiology

## 2018-05-04 ENCOUNTER — Encounter
Admission: EM | Disposition: A | Discharge status: Home / Self Care | Payer: Self-pay | Source: Home / Self Care | Attending: Emergency Medicine

## 2018-05-04 ENCOUNTER — Encounter: Payer: Self-pay | Admitting: Emergency Medicine

## 2018-05-04 ENCOUNTER — Observation Stay
Admission: EM | Admit: 2018-05-04 | Discharge: 2018-05-05 | Disposition: A | Discharge status: Home / Self Care | Payer: Self-pay | Attending: General Surgery | Admitting: General Surgery

## 2018-05-04 DIAGNOSIS — K8021 Calculus of gallbladder without cholecystitis with obstruction: Secondary | ICD-10-CM

## 2018-05-04 DIAGNOSIS — F909 Attention-deficit hyperactivity disorder, unspecified type: Secondary | ICD-10-CM | POA: Insufficient documentation

## 2018-05-04 DIAGNOSIS — Z884 Allergy status to anesthetic agent status: Secondary | ICD-10-CM | POA: Insufficient documentation

## 2018-05-04 DIAGNOSIS — F329 Major depressive disorder, single episode, unspecified: Secondary | ICD-10-CM | POA: Insufficient documentation

## 2018-05-04 DIAGNOSIS — I10 Essential (primary) hypertension: Secondary | ICD-10-CM | POA: Insufficient documentation

## 2018-05-04 DIAGNOSIS — Z79899 Other long term (current) drug therapy: Secondary | ICD-10-CM | POA: Insufficient documentation

## 2018-05-04 DIAGNOSIS — F419 Anxiety disorder, unspecified: Secondary | ICD-10-CM | POA: Insufficient documentation

## 2018-05-04 DIAGNOSIS — Z87442 Personal history of urinary calculi: Secondary | ICD-10-CM | POA: Insufficient documentation

## 2018-05-04 DIAGNOSIS — R1084 Generalized abdominal pain: Secondary | ICD-10-CM

## 2018-05-04 DIAGNOSIS — K8050 Calculus of bile duct without cholangitis or cholecystitis without obstruction: Secondary | ICD-10-CM | POA: Diagnosis present

## 2018-05-04 DIAGNOSIS — F17210 Nicotine dependence, cigarettes, uncomplicated: Secondary | ICD-10-CM | POA: Insufficient documentation

## 2018-05-04 DIAGNOSIS — J45909 Unspecified asthma, uncomplicated: Secondary | ICD-10-CM | POA: Insufficient documentation

## 2018-05-04 DIAGNOSIS — Z881 Allergy status to other antibiotic agents status: Secondary | ICD-10-CM | POA: Insufficient documentation

## 2018-05-04 DIAGNOSIS — K589 Irritable bowel syndrome without diarrhea: Secondary | ICD-10-CM | POA: Insufficient documentation

## 2018-05-04 DIAGNOSIS — Z882 Allergy status to sulfonamides status: Secondary | ICD-10-CM | POA: Insufficient documentation

## 2018-05-04 DIAGNOSIS — K760 Fatty (change of) liver, not elsewhere classified: Secondary | ICD-10-CM | POA: Insufficient documentation

## 2018-05-04 DIAGNOSIS — K8010 Calculus of gallbladder with chronic cholecystitis without obstruction: Principal | ICD-10-CM | POA: Insufficient documentation

## 2018-05-04 DIAGNOSIS — Z888 Allergy status to other drugs, medicaments and biological substances status: Secondary | ICD-10-CM | POA: Insufficient documentation

## 2018-05-04 HISTORY — PX: CHOLECYSTECTOMY: SHX55

## 2018-05-04 LAB — HEPATIC FUNCTION PANEL
ALT: 27 U/L (ref 0–44)
AST: 20 U/L (ref 15–41)
Albumin: 3.7 g/dL (ref 3.5–5.0)
Alkaline Phosphatase: 55 U/L (ref 38–126)
Total Bilirubin: 0.5 mg/dL (ref 0.3–1.2)
Total Protein: 6.7 g/dL (ref 6.5–8.1)

## 2018-05-04 LAB — URINALYSIS, COMPLETE (UACMP) WITH MICROSCOPIC
BILIRUBIN URINE: NEGATIVE
Bacteria, UA: NONE SEEN
Glucose, UA: NEGATIVE mg/dL
Ketones, ur: NEGATIVE mg/dL
Leukocytes, UA: NEGATIVE
Nitrite: NEGATIVE
Protein, ur: NEGATIVE mg/dL
RBC / HPF: >50 RBC/hpf — ABNORMAL HIGH (ref 0–5)
Specific Gravity, Urine: 1.015 (ref 1.005–1.030)
pH: 7 (ref 5.0–8.0)

## 2018-05-04 LAB — URINE DRUG SCREEN, QUALITATIVE (ARMC ONLY)
Amphetamines, Ur Screen: NOT DETECTED
Barbiturates, Ur Screen: NOT DETECTED
Benzodiazepine, Ur Scrn: NOT DETECTED
COCAINE METABOLITE, UR ~~LOC~~: NOT DETECTED
Cannabinoid 50 Ng, Ur ~~LOC~~: NOT DETECTED
MDMA (Ecstasy)Ur Screen: NOT DETECTED
Methadone Scn, Ur: NOT DETECTED
OPIATE, UR SCREEN: NOT DETECTED
PHENCYCLIDINE (PCP) UR S: NOT DETECTED
Tricyclic, Ur Screen: NOT DETECTED

## 2018-05-04 LAB — CBC
HCT: 43.7 % (ref 36.0–46.0)
Hemoglobin: 14.8 g/dL (ref 12.0–15.0)
MCH: 30 pg (ref 26.0–34.0)
MCHC: 33.9 g/dL (ref 30.0–36.0)
MCV: 88.6 fL (ref 80.0–100.0)
Platelets: 349 10*3/uL (ref 150–400)
RBC: 4.93 MIL/uL (ref 3.87–5.11)
RDW: 11.8 % (ref 11.5–15.5)
WBC: 9.6 10*3/uL (ref 4.0–10.5)
nRBC: 0 % (ref 0.0–0.2)

## 2018-05-04 LAB — BASIC METABOLIC PANEL
Anion gap: 9 (ref 5–15)
BUN: 15 mg/dL (ref 6–20)
CALCIUM: 9 mg/dL (ref 8.9–10.3)
CO2: 22 mmol/L (ref 22–32)
Chloride: 107 mmol/L (ref 98–111)
Creatinine, Ser: 0.73 mg/dL (ref 0.44–1.00)
GFR calc Af Amer: >60 mL/min (ref >60)
Glucose, Bld: 120 mg/dL — ABNORMAL HIGH (ref 70–99)
Potassium: 4.1 mmol/L (ref 3.5–5.1)
Sodium: 138 mmol/L (ref 135–145)

## 2018-05-04 LAB — TROPONIN I

## 2018-05-04 LAB — LIPASE, BLOOD: Lipase: 34 U/L (ref 11–51)

## 2018-05-04 LAB — POCT PREGNANCY, URINE: Preg Test, Ur: NEGATIVE

## 2018-05-04 SURGERY — LAPAROSCOPIC CHOLECYSTECTOMY
Anesthesia: General

## 2018-05-04 MED ORDER — LIDOCAINE-EPINEPHRINE 1 %-1:100000 IJ SOLN
INTRAMUSCULAR | Status: AC
  Filled 2018-05-04: qty 1

## 2018-05-04 MED ORDER — IBUPROFEN 800 MG PO TABS: 800.0000 mg | ORAL_TABLET | Freq: Three times a day (TID) | ORAL | 0 refills | Status: AC | PRN

## 2018-05-04 MED ORDER — GABAPENTIN 300 MG PO CAPS: 300.0000 mg | ORAL_CAPSULE | ORAL | Status: DC

## 2018-05-04 MED ORDER — SUGAMMADEX SODIUM 200 MG/2ML IV SOLN
INTRAVENOUS | Status: DC | PRN
  Administered 2018-05-04: 200 mg via INTRAVENOUS

## 2018-05-04 MED ORDER — FENTANYL CITRATE (PF) 100 MCG/2ML IJ SOLN
INTRAMUSCULAR | Status: DC | PRN
  Administered 2018-05-04: 50 ug via INTRAVENOUS
  Administered 2018-05-04 (×3): 25 ug via INTRAVENOUS
  Administered 2018-05-04: 50 ug via INTRAVENOUS
  Administered 2018-05-04: 25 ug via INTRAVENOUS

## 2018-05-04 MED ORDER — SUCCINYLCHOLINE CHLORIDE 20 MG/ML IJ SOLN
INTRAMUSCULAR | Status: DC | PRN
  Administered 2018-05-04: 100 mg via INTRAVENOUS

## 2018-05-04 MED ORDER — EPHEDRINE SULFATE 50 MG/ML IJ SOLN
INTRAMUSCULAR | Status: DC | PRN
  Administered 2018-05-04: 10 mg via INTRAVENOUS

## 2018-05-04 MED ORDER — SUGAMMADEX SODIUM 200 MG/2ML IV SOLN
INTRAVENOUS | Status: AC
  Filled 2018-05-04: qty 2

## 2018-05-04 MED ORDER — ACETAMINOPHEN 500 MG PO TABS
1000.0000 mg | ORAL_TABLET | Freq: Four times a day (QID) | ORAL | Status: DC
  Administered 2018-05-04 – 2018-05-05 (×3): 1000 mg via ORAL
  Filled 2018-05-04 (×3): qty 2

## 2018-05-04 MED ORDER — FENTANYL CITRATE (PF) 100 MCG/2ML IJ SOLN
INTRAMUSCULAR | Status: AC
  Filled 2018-05-04: qty 2

## 2018-05-04 MED ORDER — PROMETHAZINE HCL 25 MG/ML IJ SOLN
INTRAMUSCULAR | Status: AC
  Filled 2018-05-04: qty 1

## 2018-05-04 MED ORDER — KETOROLAC TROMETHAMINE 30 MG/ML IJ SOLN
30.0000 mg | Freq: Four times a day (QID) | INTRAMUSCULAR | Status: DC
  Administered 2018-05-04 – 2018-05-05 (×3): 30 mg via INTRAVENOUS
  Filled 2018-05-04 (×4): qty 1

## 2018-05-04 MED ORDER — PROMETHAZINE HCL 25 MG/ML IJ SOLN
6.2500 mg | INTRAMUSCULAR | Status: DC | PRN
  Administered 2018-05-04: 12.5 mg via INTRAVENOUS

## 2018-05-04 MED ORDER — ONDANSETRON HCL 4 MG/2ML IJ SOLN
INTRAMUSCULAR | Status: AC
  Filled 2018-05-04: qty 2

## 2018-05-04 MED ORDER — DEXMEDETOMIDINE HCL 200 MCG/2ML IV SOLN
INTRAVENOUS | Status: DC | PRN
  Administered 2018-05-04 (×2): 8 ug via INTRAVENOUS

## 2018-05-04 MED ORDER — CEFAZOLIN SODIUM-DEXTROSE 2-4 GM/100ML-% IV SOLN
2.0000 g | INTRAVENOUS | Status: AC
  Administered 2018-05-04: 2 g via INTRAVENOUS

## 2018-05-04 MED ORDER — ACETAMINOPHEN 500 MG PO TABS: 1000.0000 mg | ORAL_TABLET | ORAL | Status: DC

## 2018-05-04 MED ORDER — MIDAZOLAM HCL 2 MG/2ML IJ SOLN
INTRAMUSCULAR | Status: AC
  Filled 2018-05-04: qty 2

## 2018-05-04 MED ORDER — ACETAMINOPHEN 10 MG/ML IV SOLN
INTRAVENOUS | Status: DC | PRN
  Administered 2018-05-04: 1000 mg via INTRAVENOUS

## 2018-05-04 MED ORDER — LACTATED RINGERS IV SOLN
INTRAVENOUS | Status: DC
  Administered 2018-05-04: 12:00:00 via INTRAVENOUS

## 2018-05-04 MED ORDER — ROCURONIUM BROMIDE 100 MG/10ML IV SOLN
INTRAVENOUS | Status: DC | PRN
  Administered 2018-05-04: 40 mg via INTRAVENOUS
  Administered 2018-05-04: 20 mg via INTRAVENOUS
  Administered 2018-05-04: 10 mg via INTRAVENOUS

## 2018-05-04 MED ORDER — ACETAMINOPHEN 10 MG/ML IV SOLN
INTRAVENOUS | Status: AC
  Filled 2018-05-04: qty 100

## 2018-05-04 MED ORDER — ONDANSETRON HCL 4 MG/2ML IJ SOLN
INTRAMUSCULAR | Status: DC | PRN
  Administered 2018-05-04: 4 mg via INTRAVENOUS

## 2018-05-04 MED ORDER — MORPHINE SULFATE (PF) 4 MG/ML IV SOLN
4.0000 mg | Freq: Once | INTRAVENOUS | Status: AC
  Administered 2018-05-04: 4 mg via INTRAVENOUS
  Filled 2018-05-04: qty 1

## 2018-05-04 MED ORDER — GABAPENTIN 100 MG PO CAPS
100.0000 mg | ORAL_CAPSULE | Freq: Three times a day (TID) | ORAL | Status: DC
  Filled 2018-05-04 (×2): qty 1

## 2018-05-04 MED ORDER — MIDAZOLAM HCL 2 MG/2ML IJ SOLN
INTRAMUSCULAR | Status: DC | PRN
  Administered 2018-05-04: 2 mg via INTRAVENOUS

## 2018-05-04 MED ORDER — HYDROMORPHONE HCL 1 MG/ML IJ SOLN
0.5000 mg | INTRAMUSCULAR | Status: DC | PRN
  Administered 2018-05-04 – 2018-05-05 (×10): 0.5 mg via INTRAVENOUS
  Filled 2018-05-04 (×7): qty 0.5

## 2018-05-04 MED ORDER — LIDOCAINE HCL (CARDIAC) PF 100 MG/5ML IV SOSY
PREFILLED_SYRINGE | INTRAVENOUS | Status: DC | PRN
  Administered 2018-05-04: 100 mg via INTRAVENOUS

## 2018-05-04 MED ORDER — OXYCODONE HCL 5 MG PO TABS: 5.0000 mg | ORAL_TABLET | Freq: Four times a day (QID) | ORAL | 0 refills | Status: AC | PRN

## 2018-05-04 MED ORDER — CEFAZOLIN SODIUM 1 G IJ SOLR
INTRAMUSCULAR | Status: AC
  Filled 2018-05-04: qty 20

## 2018-05-04 MED ORDER — PROPOFOL 10 MG/ML IV BOLUS
INTRAVENOUS | Status: DC | PRN
  Administered 2018-05-04: 200 mg via INTRAVENOUS

## 2018-05-04 MED ORDER — IBUPROFEN 400 MG PO TABS
600.0000 mg | ORAL_TABLET | Freq: Four times a day (QID) | ORAL | Status: DC | PRN
  Administered 2018-05-04 – 2018-05-05 (×2): 600 mg via ORAL
  Filled 2018-05-04 (×2): qty 2

## 2018-05-04 MED ORDER — DEXAMETHASONE SODIUM PHOSPHATE 10 MG/ML IJ SOLN
INTRAMUSCULAR | Status: AC
  Filled 2018-05-04: qty 1

## 2018-05-04 MED ORDER — ONDANSETRON HCL 4 MG/2ML IJ SOLN
4.0000 mg | Freq: Once | INTRAMUSCULAR | Status: AC
  Administered 2018-05-04: 4 mg via INTRAVENOUS
  Filled 2018-05-04: qty 2

## 2018-05-04 MED ORDER — ONDANSETRON HCL 4 MG/2ML IJ SOLN: 4.0000 mg | Freq: Four times a day (QID) | INTRAMUSCULAR | Status: DC | PRN

## 2018-05-04 MED ORDER — LIDOCAINE-EPINEPHRINE 1 %-1:100000 IJ SOLN
INTRAMUSCULAR | Status: DC | PRN
  Administered 2018-05-04: 20 mL

## 2018-05-04 MED ORDER — FENTANYL CITRATE (PF) 100 MCG/2ML IJ SOLN
25.0000 ug | INTRAMUSCULAR | Status: DC | PRN
  Administered 2018-05-04 (×3): 50 ug via INTRAVENOUS

## 2018-05-04 MED ORDER — DEXAMETHASONE SODIUM PHOSPHATE 10 MG/ML IJ SOLN
INTRAMUSCULAR | Status: DC | PRN
  Administered 2018-05-04: 10 mg via INTRAVENOUS

## 2018-05-04 MED ORDER — PHENYLEPHRINE HCL 10 MG/ML IJ SOLN
INTRAMUSCULAR | Status: DC | PRN
  Administered 2018-05-04 (×4): 100 ug via INTRAVENOUS

## 2018-05-04 MED ORDER — SODIUM CHLORIDE (PF) 0.9 % IJ SOLN
INTRAMUSCULAR | Status: AC
  Filled 2018-05-04: qty 10

## 2018-05-04 MED ORDER — BUPIVACAINE HCL (PF) 0.25 % IJ SOLN
INTRAMUSCULAR | Status: AC
  Filled 2018-05-04: qty 30

## 2018-05-04 SURGICAL SUPPLY — 47 items
ADH SKN CLS APL DERMABOND .7 (GAUZE/BANDAGES/DRESSINGS) ×1
APPLIER CLIP 5 13 M/L LIGAMAX5 (MISCELLANEOUS) ×2
APR CLP MED LRG 5 ANG JAW (MISCELLANEOUS) ×1
BAG SPEC RTRVL LRG 6X4 10 (ENDOMECHANICALS) ×1
BLADE SURG SZ11 CARB STEEL (BLADE) ×2 IMPLANT
CANISTER SUCT 1200ML W/VALVE (MISCELLANEOUS) ×2 IMPLANT
CHLORAPREP W/TINT 26ML (MISCELLANEOUS) ×2 IMPLANT
CLIP APPLIE 5 13 M/L LIGAMAX5 (MISCELLANEOUS) ×1 IMPLANT
COVER WAND RF STERILE (DRAPES) ×1 IMPLANT
DECANTER SPIKE VIAL GLASS SM (MISCELLANEOUS) ×2 IMPLANT
DEFOGGER SCOPE WARMER CLEARIFY (MISCELLANEOUS) ×2 IMPLANT
DERMABOND ADVANCED (GAUZE/BANDAGES/DRESSINGS) ×1
DERMABOND ADVANCED .7 DNX12 (GAUZE/BANDAGES/DRESSINGS) ×1 IMPLANT
DRAPE SHEET LG 3/4 BI-LAMINATE (DRAPES) ×2 IMPLANT
ELECT CAUTERY BLADE TIP 2.5 (TIP) ×2
ELECT REM PT RETURN 9FT ADLT (ELECTROSURGICAL) ×2
ELECTRODE CAUTERY BLDE TIP 2.5 (TIP) ×1 IMPLANT
ELECTRODE REM PT RTRN 9FT ADLT (ELECTROSURGICAL) ×1 IMPLANT
GLOVE BIO SURGEON STRL SZ 6.5 (GLOVE) ×2 IMPLANT
GLOVE INDICATOR 7.0 STRL GRN (GLOVE) ×2 IMPLANT
GOWN STRL REUS W/ TWL LRG LVL3 (GOWN DISPOSABLE) ×3 IMPLANT
GOWN STRL REUS W/TWL LRG LVL3 (GOWN DISPOSABLE) ×6
GRASPER SUT TROCAR 14GX15 (MISCELLANEOUS) IMPLANT
IRRIGATION STRYKERFLOW (MISCELLANEOUS) ×1 IMPLANT
IRRIGATOR STRYKERFLOW (MISCELLANEOUS) ×2
IV NS 1000ML (IV SOLUTION) ×2
IV NS 1000ML BAXH (IV SOLUTION) ×1 IMPLANT
KIT TURNOVER KIT A (KITS) ×2 IMPLANT
LABEL OR SOLS (LABEL) ×2 IMPLANT
NEEDLE HYPO 22GX1.5 SAFETY (NEEDLE) ×2 IMPLANT
NS IRRIG 500ML POUR BTL (IV SOLUTION) ×2 IMPLANT
PACK LAP CHOLECYSTECTOMY (MISCELLANEOUS) ×2 IMPLANT
PENCIL ELECTRO HAND CTR (MISCELLANEOUS) ×2 IMPLANT
POUCH SPECIMEN RETRIEVAL 10MM (ENDOMECHANICALS) ×2 IMPLANT
SCISSORS METZENBAUM CVD 33 (INSTRUMENTS) ×2 IMPLANT
SLEEVE ADV FIXATION 5X100MM (TROCAR) ×4 IMPLANT
STRIP CLOSURE SKIN 1/2X4 (GAUZE/BANDAGES/DRESSINGS) ×2 IMPLANT
SUT MNCRL 4-0 (SUTURE) ×2
SUT MNCRL 4-0 27XMFL (SUTURE) ×1
SUT VICRYL 0 AB UR-6 (SUTURE) ×4 IMPLANT
SUT VICRYL+ 3-0 27IN RB-1 (SUTURE) ×2 IMPLANT
SUTURE MNCRL 4-0 27XMF (SUTURE) ×1 IMPLANT
TROCAR BALLN GELPORT 12X130M (ENDOMECHANICALS) ×2 IMPLANT
TROCAR XCEL 12X100 BLDLESS (ENDOMECHANICALS) ×1 IMPLANT
TROCAR Z-THREAD OPTICAL 5X100M (TROCAR) ×2 IMPLANT
TUBING INSUFFLATION (TUBING) ×2 IMPLANT
WATER STERILE IRR 1000ML POUR (IV SOLUTION) ×1 IMPLANT

## 2018-05-04 NOTE — ED Triage Notes (Signed)
Pt arrives POV to triage with c/o mid sternum chest pain that radiates into her back. Pt reports hx of gallstones. Pt is breathing hard at this time in triage and in NAD.

## 2018-05-04 NOTE — Anesthesia Preprocedure Evaluation (Signed)
Anesthesia Evaluation  Patient identified by MRN, date of birth, ID band Patient awake    Reviewed: Allergy & Precautions, NPO status , Patient's Chart, lab work & pertinent test results  History of Anesthesia Complications Negative for: history of anesthetic complications  Airway Mallampati: III  TM Distance: >3 FB     Dental  (+) Chipped, Dental Advidsory Given   Pulmonary neg shortness of breath, asthma , neg sleep apnea, neg recent URI, Current Smoker,           Cardiovascular Exercise Tolerance: Good hypertension, Pt. on medications (-) angina(-) CAD, (-) Past MI, (-) Cardiac Stents and (-) CABG + dysrhythmias (PVCs) (-) Valvular Problems/Murmurs     Neuro/Psych  Headaches, neg Seizures PSYCHIATRIC DISORDERS Anxiety Depression    GI/Hepatic negative GI ROS, Fatty liver IBS   Endo/Other  neg diabetesMorbid obesity  Renal/GU Renal diseaseStones      Musculoskeletal negative musculoskeletal ROS (+)   Abdominal Normal abdominal exam  (+)   Peds negative pediatric ROS (+)  Hematology negative hematology ROS (+)   Anesthesia Other Findings Past Medical History: No date: ADHD No date: Anxiety No date: Asthma No date: Bronchitis No date: CNS disorder No date: Depression No date: Drug-seeking behavior No date: Fatty liver disease, nonalcoholic No date: Gall stones No date: Gall stones No date: Genital warts No date: History of kidney stones No date: Hypertension No date: Increased frequency of headaches No date: Irritable bowel No date: Kidney stones No date: Major depressive disorder, recurrent severe without  psychotic features (HCC) No date: Migraines No date: Urinary tract infection   Reproductive/Obstetrics                             Anesthesia Physical  Anesthesia Plan  ASA: II  Anesthesia Plan: General   Post-op Pain Management:    Induction: Intravenous,  Rapid sequence and Cricoid pressure planned  PONV Risk Score and Plan: 2 and Ondansetron, Dexamethasone, Midazolam, Promethazine and Treatment may vary due to age or medical condition  Airway Management Planned: Oral ETT  Additional Equipment:   Intra-op Plan:   Post-operative Plan: Extubation in OR  Informed Consent: I have reviewed the patients History and Physical, chart, labs and discussed the procedure including the risks, benefits and alternatives for the proposed anesthesia with the patient or authorized representative who has indicated his/her understanding and acceptance.   Dental advisory given  Plan Discussed with: CRNA and Surgeon  Anesthesia Plan Comments:         Anesthesia Quick Evaluation

## 2018-05-04 NOTE — H&P (Signed)
Carol MetzBrittany E [name] is an 33 y.o. female.   Chief Complaint: bilary colic HPI: She presented to the emergency department this morning with upper abdominal pain.  She states that it started at about 4 AM.  It is continuous and does not seem to remit.  It is sharp and seems to radiate a little bit towards her back.  She does have a history of cholelithiasis with several episodes of biliary colic in the past.  She denies any emesis but did have some nausea.  She has never had pancreatitis.  She denies ever having become jaundiced or having dark urine or acholic stools.  No fevers or chills.  She has not eaten since about 930 yesterday evening.  She has no history of abdominal surgery.  Past Medical History:  Diagnosis Date  . ADHD   . Anxiety   . Asthma   . Bronchitis   . CNS disorder   . Depression   . Drug-seeking behavior   . Fatty liver disease, nonalcoholic   . Gall stones   . Gall stones   . Genital warts   . History of kidney stones   . Hypertension   . Increased frequency of headaches   . Irritable bowel   . Kidney stones   . Major depressive disorder, recurrent severe without psychotic features (HCC)   . Migraines   . Urinary tract infection     Past Surgical History:  Procedure Laterality Date  . CERVICAL BIOPSY  W/ LOOP ELECTRODE EXCISION    . CYSTOSCOPY/URETEROSCOPY/HOLMIUM LASER/STENT PLACEMENT Left 08/01/2017   Procedure: CYSTOSCOPY/URETEROSCOPY/HOLMIUM LASER/STENT PLACEMENT;  Surgeon: Vanna ScotlandBrandon, Ashley, MD;  Location: [facility] ORS;  Service: Urology;  Laterality: Left;  . NO PAST SURGERIES      Family History  Problem Relation Age of Onset  . Physical abuse Mother   . Anxiety disorder Mother   . ADD / ADHD Mother   . Paranoid behavior Mother   . Hypertension Mother   . Drug abuse Father   . Depression Father   . Alcohol abuse Maternal Grandfather   . ADD / ADHD Maternal Grandmother   . Anxiety disorder Maternal Grandmother   . Dementia Maternal Grandmother   . OCD  Maternal Grandmother   . Sexual abuse Maternal Grandmother   . Alcohol abuse Paternal Grandfather   . Bipolar disorder Cousin   . Schizophrenia Neg Hx   . Seizures Neg Hx    Social History:  reports that she has been smoking cigarettes. She has been smoking about 0.25 packs per day. She has never used smokeless tobacco. She reports that she does not drink alcohol or use drugs.  Allergies:  Allergies  Allergen Reactions  . Compazine [Prochlorperazine]     Causes psychotic reaction  . Sulfa Antibiotics Hives and Shortness Of Breath  . Pseudoephedrine Palpitations and Other (See Comments)    sweats  . Anesthesia S-I-60     Patient had minor skin issues after going home and would like this "allergy" to be taken off her list! "Blood spots on my face"   . Cephalosporins Hives and Other (See Comments)    Can take Rocephin  . Ciprocin-Fluocin-Procin [Fluocinolone Acetonide] Hives  . Ciprofloxacin Hives  . Flagyl [Metronidazole] Other (See Comments)    Causes a greenish purple fuzz on tongue  . Hydrochlorothiazide Other (See Comments)    Sweaty, faint feeling  . Keflex [Cephalexin]   . Penicillins Hives and Other (See Comments)    Has patient had a PCN reaction  causing immediate rash, facial/tongue/throat swelling, SOB or lightheadedness with hypotension: Yes Has patient had a PCN reaction causing severe rash involving mucus membranes or skin necrosis: No Has patient had a PCN reaction that required hospitalization Yes Has patient had a PCN reaction occurring within the last 10 years: No If all of the above answers are "NO", then may proceed with Cephalosporin use.      Results for orders placed or performed during the hospital encounter of 05/04/18 (from the past 48 hour(s))  Basic metabolic panel     Status: Abnormal   Collection Time: 05/04/18  4:57 AM  Result Value Ref Range   Sodium 138 135 - 145 mmol/L   Potassium 4.1 3.5 - 5.1 mmol/L   Chloride 107 98 - 111 mmol/L   CO2 22  22 - 32 mmol/L   Glucose, Bld 120 (H) 70 - 99 mg/dL   BUN 15 6 - 20 mg/dL   Creatinine, Ser 4.69 0.44 - 1.00 mg/dL   Calcium 9.0 8.9 - 62.9 mg/dL   GFR calc non Af Amer >60 >60 mL/min   GFR calc Af Amer >60 >60 mL/min   Anion gap 9 5 - 15    Comment: Performed at [facility], [address] [place], [place] [address]  CBC     Status: None   Collection Time: 05/04/18  4:57 AM  Result Value Ref Range   WBC 9.6 4.0 - 10.5 K/uL   RBC 4.93 3.87 - 5.11 MIL/uL   Hemoglobin 14.8 12.0 - 15.0 g/dL   HCT 32.4 40.1 - 02.7 %   MCV 88.6 80.0 - 100.0 fL   MCH 30.0 26.0 - 34.0 pg   MCHC 33.9 30.0 - 36.0 g/dL   RDW 25.3 66.4 - 40.3 %   Platelets 349 150 - 400 K/uL   nRBC 0.0 0.0 - 0.2 %    Comment: Performed at [facility], [address] [place], [place] [address]  Troponin I - ONCE - STAT     Status: None   Collection Time: 05/04/18  4:57 AM  Result Value Ref Range   Troponin I <0.03 <0.03 ng/mL    Comment: Performed at [facility], [address] [place], [place] [address]  Lipase, blood     Status: None   Collection Time: 05/04/18  4:57 AM  Result Value Ref Range   Lipase 34 11 - 51 U/L    Comment: Performed at [facility], [address] [place], [place] [address]  Urinalysis, Complete w Microscopic     Status: Abnormal   Collection Time: 05/04/18  5:13 AM  Result Value Ref Range   Color, Urine YELLOW (A) YELLOW   APPearance CLEAR (A) CLEAR   Specific Gravity, Urine 1.015 1.005 - 1.030   pH 7.0 5.0 - 8.0   Glucose, UA NEGATIVE NEGATIVE mg/dL   Hgb urine dipstick LARGE (A) NEGATIVE   Bilirubin Urine NEGATIVE NEGATIVE   Ketones, ur NEGATIVE NEGATIVE mg/dL   Protein, ur NEGATIVE NEGATIVE mg/dL   Nitrite NEGATIVE NEGATIVE   Leukocytes, UA NEGATIVE NEGATIVE   RBC / HPF >50 (H) 0 - 5 RBC/hpf   WBC, UA 0-5 0 - 5 WBC/hpf   Bacteria, UA NONE SEEN NONE SEEN   Squamous Epithelial / LPF 0-5 0 - 5   Mucus PRESENT     Comment: Performed at  [facility] [address], [place], [place] [address]  Hepatic function panel     Status:  None   Collection Time: 05/04/18  5:13 AM  Result Value Ref Range   Total Protein 6.7 6.5 - 8.1 g/dL   Albumin 3.7 3.5 - 5.0 g/dL   AST 20 15 - 41 U/L   ALT 27 0 - 44 U/L   Alkaline Phosphatase 55 38 - 126 U/L   Total Bilirubin 0.5 0.3 - 1.2 mg/dL   Bilirubin, Direct <7.7 0.0 - 0.2 mg/dL   Indirect Bilirubin NOT CALCULATED 0.3 - 0.9 mg/dL    Comment: Performed at [facility], [address] [place], [place] [address]  Pregnancy, urine POC     Status: None   Collection Time: 05/04/18  5:16 AM  Result Value Ref Range   Preg Test, Ur NEGATIVE NEGATIVE    Comment:        THE SENSITIVITY OF THIS METHODOLOGY IS >24 mIU/mL    Dg Chest 2 View  Result Date: 05/04/2018 CLINICAL DATA:  Acute onset of midsternal chest pain, radiating to the back. EXAM: CHEST - 2 VIEW COMPARISON:  Chest radiograph performed 01/29/2018 FINDINGS: The lungs are well-aerated. Peribronchial thickening is noted. There is no evidence of focal opacification, pleural effusion or pneumothorax. The heart is normal in size; the mediastinal contour is within normal limits. No acute osseous abnormalities are seen. IMPRESSION: Peribronchial thickening noted. Lungs otherwise clear. Electronically Signed   By: Roanna Raider M.D.   On: 05/04/2018 05:27   US Abdomen Limited Ruq  Result Date: 05/04/2018 CLINICAL DATA:  Initial evaluation for acute generalized abdominal pain. EXAM: ULTRASOUND ABDOMEN LIMITED RIGHT UPPER QUADRANT COMPARISON:  Prior ultrasound from 09/18/2017. FINDINGS: Gallbladder: Multiple echogenic stones seen within the gallbladder lumen, largest of which measures approximately 1 cm in size. At least 2 of the stones appear nonmobile and lodged at the gallbladder neck. Gallbladder wall measure within normal limits of 1.9 mm. No free pericholecystic fluid. Positive sonographic Murphy sign elicited on exam.  Common bile duct: Diameter: 2.7 mm Liver: No focal lesion identified. Diffusely increased echogenicity within the hepatic parenchyma, suggesting steatosis. Portal vein is patent on color Doppler imaging with normal direction of blood flow towards the liver. IMPRESSION: 1. Cholelithiasis with at least 2 nonmobile stones lodged at the gallbladder neck, with associated positive sonographic Murphy sign. No other sonographic features to suggest acute cholecystitis. 2. No biliary dilatation. 3. Hepatic steatosis. Electronically Signed   By: Rise Mu M.D.   On: 05/04/2018 06:37    Review of Systems  All other systems reviewed and are negative.   Blood pressure (!) 127/99, pulse 70, temperature 97.8 F (36.6 C), temperature source Oral, resp. rate 18, height 5\' 5"  (1.651 m), weight 99.8 kg, last menstrual period 05/03/2018, SpO2 100 %. Physical Exam  Constitutional: She is oriented to person, place, and time. She appears well-developed and well-nourished.  HENT:  Head: Normocephalic and atraumatic.  Mouth/Throat: No oropharyngeal exudate.  Eyes: Pupils are equal, round, and reactive to light. Right eye exhibits no discharge. Left eye exhibits no discharge. No scleral icterus.  Neck: Normal range of motion. No thyromegaly present.  Cardiovascular: Normal rate and regular rhythm.  Respiratory: Effort normal and breath sounds normal.  GI: Soft. There is abdominal tenderness. There is guarding. There is no rebound.  Tender in RUQ. Murphy sign present.  Musculoskeletal:        General: No edema.  Neurological: She is alert and oriented to person, place, and time.  Skin: Skin is warm and dry.  Psychiatric: She has a normal mood  and affect.     Assessment/Plan This is a 33 year old woman with multiple episodes of biliary colic in the past.  Today's ultrasound shows a stone wedged in the gallbladder neck.  No concern for acute cholecystitis.  I have recommended that she undergo laparoscopic  cholecystectomy today.  We will get this scheduled.I discussed the procedure in detail.  We discussed the risks and benefits of a laparoscopic cholecystectomy and possible cholangiogram including, but not limited to: bleeding, infection, injury to surrounding structures such as the intestine or liver, bile leak, retained gallstones, need to convert to an open procedure, prolonged diarrhea, blood clots such as DVT, common bile duct injury, anesthesia risks, and possible need for additional procedures. The patient had the opportunity to ask any questions and these were answered to their satisfaction.  Duanne Guess, MD 05/04/2018, 9:53 AM

## 2018-05-04 NOTE — Anesthesia Postprocedure Evaluation (Signed)
Anesthesia Post Note  Patient: [name]  Procedure(s) Performed: LAPAROSCOPIC CHOLECYSTECTOMY (N/A )  Patient location during evaluation: PACU Anesthesia Type: General Level of consciousness: awake and alert Pain management: pain level controlled Vital Signs Assessment: post-procedure vital signs reviewed and stable Respiratory status: spontaneous breathing, nonlabored ventilation, respiratory function stable and patient connected to nasal cannula oxygen Cardiovascular status: blood pressure returned to baseline and stable Postop Assessment: no apparent nausea or vomiting Anesthetic complications: no     Last Vitals:  Vitals:   05/04/18 1511 05/04/18 1530  BP: 106/72 119/73  Pulse: 94 94  Resp: (!) 9 18  Temp:  36.9 C  SpO2: 94% 99%    Last Pain:  Vitals:   05/04/18 1530  TempSrc: Oral  PainSc:                  Lenard Simmer

## 2018-05-04 NOTE — Brief Op Note (Signed)
05/04/2018  2:27 PM  PATIENT:  Carol [name]  33 y.o. female  PRE-OPERATIVE DIAGNOSIS:  symptomatic cholelithiasis  POST-OPERATIVE DIAGNOSIS:  same  PROCEDURE:  Procedure(s): LAPAROSCOPIC CHOLECYSTECTOMY (N/A)  SURGEON:  Surgeon(s) and Role:    * Duanne Guess, MD - Primary  PHYSICIAN ASSISTANT:   ASSISTANTS: none   ANESTHESIA:   general  EBL:  10 mL   BLOOD ADMINISTERED:none  DRAINS: none   LOCAL MEDICATIONS USED:  Amount: 20 ml  SPECIMEN:  Source of Specimen:  gall bladder  DISPOSITION OF SPECIMEN:  PATHOLOGY  COUNTS:  YES  TOURNIQUET:  * No tourniquets in log *  DICTATION: .Note written in EPIC  PLAN OF CARE: Discharge to home after PACU  PATIENT DISPOSITION:  PACU - hemodynamically stable.   Delay start of Pharmacological VTE agent (>24hrs) due to surgical blood loss or risk of bleeding: not applicable

## 2018-05-04 NOTE — Anesthesia Post-op Follow-up Note (Signed)
Anesthesia QCDR form completed.        

## 2018-05-04 NOTE — ED Provider Notes (Addendum)
[facility] Emergency Department Provider Note   ____________________________________________    I have reviewed the triage vital signs and the nursing notes.   HISTORY  Chief Complaint Chest Pain     HPI Carol [name] is a 33 y.o. female who presents with complaints of upper abdominal pain.  Patient reports pain started at 4 AM this morning.  It has been continuous.  She reports pain in her back as well.  She reports nausea.  She reports the pain is moderate to severe and sharp in nature.  She has had this before and reports a history of gallstones.  No history of abdominal surgery.  Has not eaten since yesterday evening.  Denies fevers or chills.  No shortness of breath.  Not taken anything for this   Past Medical History:  Diagnosis Date  . ADHD   . Anxiety   . Asthma   . Bronchitis   . CNS disorder   . Depression   . Drug-seeking behavior   . Fatty liver disease, nonalcoholic   . Gall stones   . Gall stones   . Genital warts   . History of kidney stones   . Hypertension   . Increased frequency of headaches   . Irritable bowel   . Kidney stones   . Major depressive disorder, recurrent severe without psychotic features (HCC)   . Migraines   . Urinary tract infection     Patient Active Problem List   Diagnosis Date Noted  . Pelvic pain in female 12/19/2016  . Condyloma acuminatum of vulva 10/22/2016  . MDD (major depressive disorder), recurrent severe, without psychosis (HCC) 11/01/2013  . 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 Borderline behavior 05/08/2012  . ADHD (attention deficit hyperactivity disorder) 05/08/2012  . Insomnia secondary to depression with anxiety 05/08/2012    Past Surgical History:  Procedure Laterality Date  . CERVICAL BIOPSY  W/ LOOP ELECTRODE EXCISION    . CYSTOSCOPY/URETEROSCOPY/HOLMIUM LASER/STENT PLACEMENT Left 08/01/2017   Procedure: CYSTOSCOPY/URETEROSCOPY/HOLMIUM LASER/STENT PLACEMENT;  Surgeon: Vanna ScotlandBrandon,  Ashley, MD;  Location: [facility] ORS;  Service: Urology;  Laterality: Left;  . NO PAST SURGERIES      Prior to Admission medications   Medication Sig Start Date End Date Taking? Authorizing Provider  ALPRAZolam Prudy Feeler(XANAX) 1 MG tablet Take 1 mg by mouth 3 (three) times daily as needed for anxiety.    Yes [provider]  oxyCODONE-acetaminophen (PERCOCET) 7.5-325 MG tablet Take 1 tablet by mouth every 8 (eight) hours as needed for severe pain. 09/18/17  Yes Darci CurrentBrown, [place] N, MD  tiZANidine (ZANAFLEX) 4 MG capsule Take 4 mg by mouth 3 (three) times daily as needed for muscle spasms.    Yes [provider]  buPROPion (WELLBUTRIN XL) 150 MG 24 hr tablet Take 150 mg by mouth daily at 6 (six) AM.    [provider]  docusate sodium (COLACE) 100 MG capsule Take 1 capsule (100 mg total) by mouth 2 (two) times daily. Patient not taking: Reported on 04/08/2018 08/01/17   Vanna ScotlandBrandon, Ashley, MD  doxycycline (VIBRAMYCIN) 100 MG capsule Take 1 capsule (100 mg total) by mouth 2 (two) times daily. Patient not taking: Reported on 07/29/2017 07/19/17   Ward, Chase PicketJaime Pilcher, PA-C  HYDROcodone-acetaminophen (NORCO/VICODIN) 5-325 MG tablet Take 1-2 tablets by mouth every 6 (six) hours as needed for moderate pain. Patient not taking: Reported on 04/14/2018 08/01/17   Vanna ScotlandBrandon, Ashley, MD  ondansetron (ZOFRAN ODT) 4 MG disintegrating tablet Take 1 tablet (4 mg  total) by mouth every 8 (eight) hours as needed for nausea or vomiting. Patient not taking: Reported on 08/01/2017 07/27/17   Nita Sickle, MD  ondansetron (ZOFRAN ODT) 4 MG disintegrating tablet Take 1 tablet (4 mg total) by mouth every 8 (eight) hours as needed for nausea or vomiting. Patient not taking: Reported on 04/08/2018 09/18/17   Darci Current, MD  oxybutynin (DITROPAN) 5 MG tablet Take 1 tablet (5 mg total) by mouth every 8 (eight) hours as needed for bladder spasms. Patient not taking: Reported on 04/08/2018 08/01/17   Vanna Scotland, MD   oxyCODONE-acetaminophen (PERCOCET) 7.5-325 MG tablet Take 1 tablet by mouth 3 (three) times daily.    [provider]  tamsulosin (FLOMAX) 0.4 MG CAPS capsule Take 1 capsule (0.4 mg total) by mouth daily. Patient not taking: Reported on 07/29/2017 07/24/17   Riki Altes, MD     Allergies Compazine [prochlorperazine]; Sulfa antibiotics; Pseudoephedrine; Anesthesia s-i-60; Cephalosporins; Ciprocin-fluocin-procin [fluocinolone acetonide]; Ciprofloxacin; Flagyl [metronidazole]; Hydrochlorothiazide; Keflex [cephalexin]; and Penicillins  Family History  Problem Relation Age of Onset  . Physical abuse Mother   . Anxiety disorder Mother   . ADD / ADHD Mother   . Paranoid behavior Mother   . Hypertension Mother   . Drug abuse Father   . Depression Father   . Alcohol abuse Maternal Grandfather   . ADD / ADHD Maternal Grandmother   . Anxiety disorder Maternal Grandmother   . Dementia Maternal Grandmother   . OCD Maternal Grandmother   . Sexual abuse Maternal Grandmother   . Alcohol abuse Paternal Grandfather   . Bipolar disorder Cousin   . Schizophrenia Neg Hx   . Seizures Neg Hx     Social History Social History   Tobacco Use  . Smoking status: Light Tobacco Smoker    Packs/day: 0.25    Types: Cigarettes  . Smokeless tobacco: Never Used  Substance Use Topics  . Alcohol use: No  . Drug use: No    Review of Systems  Constitutional: No fever/chills Eyes: No visual changes.  ENT: No sore throat. Cardiovascular: Some chest pain radiating from abdomen Respiratory: Denies shortness of breath. Gastrointestinal: As above, positive nausea Genitourinary: Negative for dysuria. Musculoskeletal: Back pain from abdominal pain Skin: Negative for rash. Neurological: Negative for headaches    ____________________________________________   PHYSICAL EXAM:  VITAL SIGNS: ED Triage Vitals  Enc Vitals Group     BP 05/04/18 0456 (!) 125/94     Pulse Rate 05/04/18 0456 84      Resp 05/04/18 0456 18     Temp 05/04/18 0456 97.8 F (36.6 C)     Temp Source 05/04/18 0456 Oral     SpO2 05/04/18 0456 100 %     Weight 05/04/18 0455 99.8 kg (220 lb)     Height 05/04/18 0455 1.651 m (5\' 5" )     Head Circumference --      Peak Flow --      Pain Score 05/04/18 0454 10     Pain Loc --      Pain Edu? --      Excl. in GC? --     Constitutional: Alert and oriented Eyes: Conjunctivae are normal.   Nose: No congestion/rhinnorhea. Mouth/Throat: Mucous membranes are moist.    Cardiovascular: Normal rate, regular rhythm. Grossly normal heart sounds.  Good peripheral circulation. Respiratory: Normal respiratory effort.  No retractions. Lungs CTAB. Gastrointestinal: Significant tenderness in the right upper quadrant, no distention, no CVA tenderness, no lower abdominal  tenderness  Musculoskeletal: Warm and well perfused Neurologic:  Normal speech and language. No gross focal neurologic deficits are appreciated.  Skin:  Skin is warm, dry and intact. No rash noted. Psychiatric: Mood and affect are normal. Speech and behavior are normal.  ____________________________________________   LABS (all labs ordered are listed, but only abnormal results are displayed)  Labs Reviewed  BASIC METABOLIC PANEL - Abnormal; Notable for the following components:      Result Value   Glucose, Bld 120 (*)    All other components within normal limits  URINALYSIS, COMPLETE (UACMP) WITH MICROSCOPIC - Abnormal; Notable for the following components:   Color, Urine YELLOW (*)    APPearance CLEAR (*)    Hgb urine dipstick LARGE (*)    RBC / HPF >50 (*)    All other components within normal limits  CBC  TROPONIN I  LIPASE, BLOOD  HEPATIC FUNCTION PANEL  POC URINE PREG, ED  POCT PREGNANCY, URINE   ____________________________________________  EKG  ED ECG REPORT I, Jene Everyobert Kaydon Husby, the attending physician, personally viewed and interpreted this ECG.  Date: 05/04/2018  Rhythm:  normal sinus rhythm QRS Axis: normal Intervals: normal ST/T Wave abnormalities: normal Narrative Interpretation: no evidence of acute ischemia  ____________________________________________  RADIOLOGY  Chest x-ray, no acute distress Ultrasound demonstrates cholelithiasis with 2 nonmobile stones lodged in the gallbladder neck, positive Murphy sign ____________________________________________   PROCEDURES  Procedure(s) performed: No  Procedures   Critical Care performed: No ____________________________________________   INITIAL IMPRESSION / ASSESSMENT AND PLAN / ED COURSE  Pertinent labs & imaging results that were available during my care of the patient were reviewed by me and considered in my medical decision making (see chart for details).  Patient presents with upper abdominal pain, history of cholelithiasis.  EKG, chest x-ray unremarkable.  Ultrasound confirms 2 nonmobile stones in the neck of the gallbladder.  LFTs were not initially ordered, will add on.  Patient treated with IV morphine and IV Zofran with little improvement.  Will discuss with surgery  Consulted Dr. Lady Garyannon at [phone], she will see the patient in the ED  ----------------------------------------- 9:46 AM on 05/04/2018 -----------------------------------------  Dr. Lady Garyannon will take the patient to the operating room    ____________________________________________   FINAL CLINICAL IMPRESSION(S) / ED DIAGNOSES  Final diagnoses:  Calculus of gallbladder with biliary obstruction but without cholecystitis        Note:  This document was prepared using Dragon voice recognition software and may include unintentional dictation errors.   Jene EveryKinner, Sylar Voong, MD 05/04/18 [id number]    Jene EveryKinner, Ayde Record, MD 05/04/18 [phone]

## 2018-05-04 NOTE — Anesthesia Procedure Notes (Signed)
Procedure Name: Intubation Date/Time: 05/04/2018 12:38 PM Performed by: Aline Brochure, CRNA Pre-anesthesia Checklist: Patient identified and Emergency Drugs available Patient Re-evaluated:Patient Re-evaluated prior to induction Oxygen Delivery Method: Circle system utilized Preoxygenation: Pre-oxygenation with 100% oxygen Induction Type: IV induction Ventilation: Mask ventilation without difficulty Laryngoscope Size: Mac and 3 Grade View: Grade I Tube type: Oral Tube size: 7.0 mm Number of attempts: 1 Airway Equipment and Method: Stylet Placement Confirmation: ETT inserted through vocal cords under direct vision,  positive ETCO2 and breath sounds checked- equal and bilateral Secured at: 20 cm Tube secured with: Tape Dental Injury: Teeth and Oropharynx as per pre-operative assessment

## 2018-05-04 NOTE — Transfer of Care (Signed)
Immediate Anesthesia Transfer of Care Note  Patient: Carol [name]  Procedure(s) Performed: LAPAROSCOPIC CHOLECYSTECTOMY (N/A )  Patient Location: PACU  Anesthesia Type:General  Level of Consciousness: sedated  Airway & Oxygen Therapy: Patient connected to face mask oxygen  Post-op Assessment: Post -op Vital signs reviewed and stable  Post vital signs: stable  Last Vitals:  Vitals Value Taken Time  BP 101/48 05/04/2018  2:26 PM  Temp 37.2 C 05/04/2018  2:26 PM  Pulse 93 05/04/2018  2:26 PM  Resp 19 05/04/2018  2:26 PM  SpO2 97 % 05/04/2018  2:26 PM  Vitals shown include unvalidated device data.  Last Pain:  Vitals:   05/04/18 1425  TempSrc: Temporal  PainSc:          Complications: No apparent anesthesia complications

## 2018-05-04 NOTE — Op Note (Signed)
Laparoscopic Cholecystectomy  Pre-operative Diagnosis: biliary colic  Post-operative Diagnosis: same  Procedure: laparoscopic cholecystectomy  Surgeon: Duanne Guess, MD  Anesthesia: GETA  Assistant:none   Findings: Multiple stones, several of which were lodged at the neck of the gallbladder.   Estimated Blood Loss: <10cc         Drains: none         Specimens: Gallbladder           Complications: none   Procedure Details  The patient was seen again in the preoperative holding area. The benefits, complications, treatment options, and expected outcomes were discussed with the patient. The risks of bleeding, infection, recurrence of symptoms, failure to resolve symptoms, bile duct damage, bile duct leak, retained common bile duct stone, bowel injury, any of which could require further surgery and/or ERCP, stent, or papillotomy were reviewed with the patient. The likelihood of improving the patient's symptoms with return to their baseline status is good.  The patient and/or family concurred with the proposed plan, giving informed consent.  The patient was taken to operating room, identified as Carol [name] and the procedure verified as Laparoscopic Cholecystectomy. A time out was performed and the above information confirmed.  Prior to the induction of general anesthesia, antibiotic prophylaxis was administered. VTE prophylaxis was in place. General endotracheal anesthesia was then administered and tolerated well. After the induction, the abdomen was prepped with Chloraprep and draped in the sterile fashion. The patient was positioned in the supine position.  Optiview technique was used to enter the abdomen in the RUQ.Marland Kitchen Pneumoperitoneum was then created with CO2 and tolerated well without any adverse changes in the patient's vital signs.  Two additional 5-mm ports were placed in the right upper quadrant under direct vision. A periumbilical port was placed. All skin incisions   were infiltrated with a local anesthetic agent before making the incision and placing the trocars.   The patient was positioned  in reverse Trendelenburg, tilted slightly to the patient's left.  The gallbladder was identified, the fundus grasped and retracted cephalad. Adhesions were lysed bluntly. The infundibulum was grasped and retracted laterally, exposing the peritoneum overlying the triangle of Calot. This was then divided and exposed in a blunt fashion. An extended critical view of the cystic duct and cystic artery was obtained.  The cystic duct was clearly identified and bluntly dissected free. Both the cystic artery and duct were double clipped and divided.  The gallbladder was taken from the gallbladder fossa in a retrograde fashion with the electrocautery. The gallbladder was removed and placed in an Endocatch bag. The liver bed was irrigated and inspected. Hemostasis was achieved with the electrocautery. Copious saline irrigation was utilized and was repeatedly aspirated until clear.  The gallbladder and Endocatch sac were then removed through a port site.   Inspection of the right upper quadrant was performed. No bleeding, bile duct injury or leak, or bowel injury was noted. Pneumoperitoneum was released.  The periumbilical port site was closed with interrupted 0 Vicryl sutures. 4-0 subcuticular Monocryl was used to close the skin. Dermabond was applied.  The patient was then extubated and brought to the recovery room in stable condition. Sponge, lap, and needle counts were correct at closure and at the conclusion of the case.               Duanne Guess, MD, FACS

## 2018-05-05 ENCOUNTER — Encounter: Payer: Self-pay | Admitting: General Surgery

## 2018-05-05 MED ORDER — HYDROMORPHONE HCL 1 MG/ML IJ SOLN
INTRAMUSCULAR | Status: AC
  Filled 2018-05-05: qty 1

## 2018-05-05 NOTE — Discharge Summary (Addendum)
[facility] SURGICAL DISCHARGE SUMMARY   Patient ID: Carol [name] MRN: [id number] DOB/AGE: 29-Jul-1985 33 y.o.  Admit date: 05/04/2018 Discharge date: 05/05/2018  Discharge Diagnoses Cholelithiasis Biliary Colic  Consultants None  Procedures Laparoscopic Cholecystectomy   HPI: Carol [name] presented to the emergency department  with upper abdominal pain.  Carol [name] states that it started at about 4 AM.  It is continuous and does not seem to remit.  It is sharp and seems to radiate a little bit towards her back.  Carol [name] does have a history of cholelithiasis with several episodes of biliary colic in the past.  Carol [name] denies any emesis but did have some nausea.  Carol [name] has never had pancreatitis.  Carol [name] denies ever having become jaundiced or having dark urine or acholic stools.  No fevers or chills.  Carol [name] has not eaten since about 930 yesterday evening.  Carol [name] has no history of abdominal surgery.  Hospital Course: Informed consent was obtained and documented, and patient underwent uneventful laparoscopic cholecystectomy (Dr Lady Gary, 05/05/2018).  Post-operatively, patient's pain and nausea improved/resolved and advancement of patient's diet and ambulation were well-tolerated. The remainder of patient's hospital course was essentially unremarkable, and discharge planning was initiated accordingly with patient safely able to be discharged home with appropriate discharge instructions, pain control, and outpatient follow-up after all of her questions were answered to her expressed satisfaction.  Discharge Condition: Good   Physical Examination:  Constitutional: Well appearing female, NAD Pulmonary: No respiratory distress Gastrointestinal: Soft, incisional tenderness, non-distended Skin: Laparoscopic incisions are CDI, no erythema or drainage   Allergies as of 05/05/2018      Reactions   Compazine [prochlorperazine]    Causes psychotic reaction   Sulfa Antibiotics Hives, Shortness Of  Breath   Pseudoephedrine Palpitations, Other (See Comments)   sweats   Anesthesia S-i-60    Patient had minor skin issues after going home and would like this "allergy" to be taken off her list! "Blood spots on my face"    Cephalosporins Hives, Other (See Comments)   Can take Rocephin   Ciprocin-fluocin-procin [fluocinolone Acetonide] Hives   Ciprofloxacin Hives   Flagyl [metronidazole] Other (See Comments)   Causes a greenish purple fuzz on tongue   Hydrochlorothiazide Other (See Comments)   Sweaty, faint feeling   Keflex [cephalexin]    Penicillins Hives, Other (See Comments)   Has patient had a PCN reaction causing immediate rash, facial/tongue/throat swelling, SOB or lightheadedness with hypotension: Yes Has patient had a PCN reaction causing severe rash involving mucus membranes or skin necrosis: No Has patient had a PCN reaction that required hospitalization Yes Has patient had a PCN reaction occurring within the last 10 years: No If all of the above answers are "NO", then may proceed with Cephalosporin use.      Medication List    STOP taking these medications   docusate sodium 100 MG capsule Commonly known as:  COLACE   doxycycline 100 MG capsule Commonly known as:  VIBRAMYCIN   HYDROcodone-acetaminophen 5-325 MG tablet Commonly known as:  NORCO/VICODIN   ondansetron 4 MG disintegrating tablet Commonly known as:  ZOFRAN ODT   oxybutynin 5 MG tablet Commonly known as:  DITROPAN   oxyCODONE-acetaminophen 7.5-325 MG tablet Commonly known as:  PERCOCET   tamsulosin 0.4 MG Caps capsule Commonly known as:  FLOMAX     TAKE these medications   ALPRAZolam 1 MG tablet Commonly known as:  XANAX Take 1 mg by mouth 3 (three) times daily as needed for anxiety.  buPROPion 150 MG 24 hr tablet Commonly known as:  WELLBUTRIN XL Take 150 mg by mouth daily at 6 (six) AM.   ibuprofen 800 MG tablet Commonly known as:  ADVIL,MOTRIN Take 1 tablet (800 mg total) by mouth  every 8 (eight) hours as needed.   oxyCODONE 5 MG immediate release tablet Commonly known as:  Oxy IR/ROXICODONE Take 1 tablet (5 mg total) by mouth every 6 (six) hours as needed for severe pain.   tiZANidine 4 MG capsule Commonly known as:  ZANAFLEX Take 4 mg by mouth 3 (three) times daily as needed for muscle spasms.        Follow-up Information    Duanne Guess, MD. Go on 05/19/2018.   Specialty:  General Surgery Why:  Monday January 20th at 10:30am for a follow-up appointment  Contact information: [address] [place] [address] [phone]            -- Lynden Oxford , PA-C [facility]  05/05/2018, 10:27 AM [phone] M-F: 7am - 4pm  I saw and evaluated the patient.  I agree with the above documentation, exam, and plan, which I have edited where appropriate. Duanne Guess  8:46 AM

## 2018-05-05 NOTE — Progress Notes (Signed)
[name] to be D/C'd home per MD order.  Discussed prescriptions and follow up appointments with the patient. Prescriptions given to patient, medication list explained in detail. Pt verbalized understanding.  Allergies as of 05/05/2018      Reactions   Compazine [prochlorperazine]    Causes psychotic reaction   Sulfa Antibiotics Hives, Shortness Of Breath   Pseudoephedrine Palpitations, Other (See Comments)   sweats   Anesthesia S-i-60    Patient had minor skin issues after going home and would like this "allergy" to be taken off her list! "Blood spots on my face"    Cephalosporins Hives, Other (See Comments)   Can take Rocephin   Ciprocin-fluocin-procin [fluocinolone Acetonide] Hives   Ciprofloxacin Hives   Flagyl [metronidazole] Other (See Comments)   Causes a greenish purple fuzz on tongue   Hydrochlorothiazide Other (See Comments)   Sweaty, faint feeling   Keflex [cephalexin]    Penicillins Hives, Other (See Comments)   Has patient had a PCN reaction causing immediate rash, facial/tongue/throat swelling, SOB or lightheadedness with hypotension: Yes Has patient had a PCN reaction causing severe rash involving mucus membranes or skin necrosis: No Has patient had a PCN reaction that required hospitalization Yes Has patient had a PCN reaction occurring within the last 10 years: No If all of the above answers are "NO", then may proceed with Cephalosporin use.      Medication List    STOP taking these medications   docusate sodium 100 MG capsule Commonly known as:  COLACE   doxycycline 100 MG capsule Commonly known as:  VIBRAMYCIN   HYDROcodone-acetaminophen 5-325 MG tablet Commonly known as:  NORCO/VICODIN   ondansetron 4 MG disintegrating tablet Commonly known as:  ZOFRAN ODT   oxybutynin 5 MG tablet Commonly known as:  DITROPAN   oxyCODONE-acetaminophen 7.5-325 MG tablet Commonly known as:  PERCOCET   tamsulosin 0.4 MG Caps capsule Commonly known as:   FLOMAX     TAKE these medications   ALPRAZolam 1 MG tablet Commonly known as:  XANAX Take 1 mg by mouth 3 (three) times daily as needed for anxiety.   buPROPion 150 MG 24 hr tablet Commonly known as:  WELLBUTRIN XL Take 150 mg by mouth daily at 6 (six) AM.   ibuprofen 800 MG tablet Commonly known as:  ADVIL,MOTRIN Take 1 tablet (800 mg total) by mouth every 8 (eight) hours as needed.   oxyCODONE 5 MG immediate release tablet Commonly known as:  Oxy IR/ROXICODONE Take 1 tablet (5 mg total) by mouth every 6 (six) hours as needed for severe pain.   tiZANidine 4 MG capsule Commonly known as:  ZANAFLEX Take 4 mg by mouth 3 (three) times daily as needed for muscle spasms.       Vitals:   05/04/18 2009 05/05/18 0548  BP: 111/69 111/76  Pulse: 99 85  Resp: (!) 24 16  Temp: 98.1 F (36.7 C) 97.6 F (36.4 C)  SpO2: 97% 97%    Skin clean, dry and intact without evidence of skin break down, no evidence of skin tears noted. IV catheter discontinued intact. Site without signs and symptoms of complications. Dressing and pressure applied. Pt denies pain at this time. No complaints noted.  An After Visit Summary was printed and given to the patient. Patient escorted via WC, and D/C home via private auto.  Prestyn Mahn A Samnang Shugars

## 2018-05-06 LAB — HIV ANTIBODY (ROUTINE TESTING W REFLEX): HIV Screen 4th Generation wRfx: NONREACTIVE

## 2018-05-07 LAB — SURGICAL PATHOLOGY

## 2018-05-19 ENCOUNTER — Encounter: Payer: Self-pay | Admitting: General Surgery

## 2018-05-21 ENCOUNTER — Ambulatory Visit: Admission: RE | Admit: 2018-05-21 | Payer: Self-pay | Source: Ambulatory Visit

## 2018-05-21 ENCOUNTER — Ambulatory Visit (INDEPENDENT_AMBULATORY_CARE_PROVIDER_SITE_OTHER): Payer: Self-pay | Admitting: General Surgery

## 2018-05-21 ENCOUNTER — Encounter: Payer: Self-pay | Admitting: *Deleted

## 2018-05-21 ENCOUNTER — Encounter: Payer: Self-pay | Admitting: General Surgery

## 2018-05-21 ENCOUNTER — Other Ambulatory Visit: Payer: Self-pay

## 2018-05-21 VITALS — BP 131/95 | HR 88 | Temp 98.4°F | Ht 65.0 in | Wt 226.4 lb

## 2018-05-21 DIAGNOSIS — R1010 Upper abdominal pain, unspecified: Secondary | ICD-10-CM

## 2018-05-21 DIAGNOSIS — Z9049 Acquired absence of other specified parts of digestive tract: Secondary | ICD-10-CM

## 2018-05-21 NOTE — Progress Notes (Signed)
Patient has been scheduled for a CT abdomen [facility]21-18 (STAT). Prep: patient aware nothing else to eat or drink. Patient was [facility] straight [facility] (1st desk on the right). Patient verbalizes understanding.  The CT department will contact Dr. Lady Garyannon with results.   Patient will be contacted once results are available. No follow up appointment with Dr. Lady Garyannon has been made at this time.

## 2018-05-21 NOTE — Progress Notes (Signed)
Carol [name] is here today for a postoperative visit after undergoing a laparoscopic cholecystectomy.  Carol [name] operation was uncomplicated.  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  - ONE BENIGN LYMPH NODE.  - NEGATIVE FOR ATYPIA AND MALIGNANCY.   Carol [name] states that Carol [name] has not done particularly well postoperatively.  Carol [name] feels a "pulling sensation" near the umbilical site.  Carol [name] says that Carol [name] has had to take an additional week off work secondary to pain and nausea.  Carol [name] is requesting additional pain medication today.  Past Medical History:  Diagnosis Date  . ADHD   . Anxiety   . Asthma   . Bronchitis   . CNS disorder   . Depression   . Drug-seeking behavior   . Fatty liver disease, nonalcoholic   . Gall stones   . Gall stones   . Genital warts   . History of kidney stones   . Hypertension   . Increased frequency of headaches   . Irritable bowel   . Kidney stones   . Major depressive disorder, recurrent severe without psychotic features (HCC)   . Migraines   . Urinary tract infection    Past Surgical History:  Procedure Laterality Date  . CERVICAL BIOPSY  W/ LOOP ELECTRODE EXCISION    . CHOLECYSTECTOMY N/A 05/04/2018   Procedure: LAPAROSCOPIC CHOLECYSTECTOMY;  Surgeon: Duanne Guess, MD;  Location: [facility] ORS;  Service: General;  Laterality: N/A;  . CYSTOSCOPY/URETEROSCOPY/HOLMIUM LASER/STENT PLACEMENT Left 08/01/2017   Procedure: CYSTOSCOPY/URETEROSCOPY/HOLMIUM LASER/STENT PLACEMENT;  Surgeon: Vanna Scotland, MD;  Location: [facility] ORS;  Service: Urology;  Laterality: Left;  . NO PAST SURGERIES     Family History  Problem Relation Age of Onset  . Physical abuse Mother   . Anxiety disorder Mother   . ADD / ADHD Mother   . Paranoid behavior Mother   . Hypertension Mother   . Drug abuse Father   . Depression Father   . Alcohol abuse Maternal Grandfather   . ADD / ADHD Maternal Grandmother    . Anxiety disorder Maternal Grandmother   . Dementia Maternal Grandmother   . OCD Maternal Grandmother   . Sexual abuse Maternal Grandmother   . Alcohol abuse Paternal Grandfather   . Bipolar disorder Cousin   . Schizophrenia Neg Hx   . Seizures Neg Hx    Social History   Tobacco Use  . Smoking status: Former Smoker    Packs/day: 0.25    Types: Cigarettes  . Smokeless tobacco: Never Used  Substance Use Topics  . Alcohol use: No  . Drug use: No   Current Meds  Medication Sig  . ALPRAZolam (XANAX) 1 MG tablet Take 1 mg by mouth 3 (three) times daily as needed for anxiety.   Marland Kitchen buPROPion (WELLBUTRIN XL) 150 MG 24 hr tablet Take 150 mg by mouth daily at 6 (six) AM.  . ibuprofen (ADVIL,MOTRIN) 800 MG tablet Take 1 tablet (800 mg total) by mouth every 8 (eight) hours as needed.  Marland Kitchen oxyCODONE (OXY IR/ROXICODONE) 5 MG immediate release tablet Take 1 tablet (5 mg total) by mouth every 6 (six) hours as needed for severe pain.  Marland Kitchen tiZANidine (ZANAFLEX) 4 MG capsule Take 4 mg by mouth 3 (three) times daily as needed for muscle spasms.    Allergies  Allergen Reactions  . Compazine [Prochlorperazine]     Causes psychotic reaction  . Shellfish Allergy Anaphylaxis  . Sulfa Antibiotics Hives and Shortness Of Breath  . Sulfasalazine Hives  and Shortness Of Breath  . Erythromycin Hives  . Pseudoephedrine Palpitations and Other (See Comments)    sweats  . Anesthesia S-I-60     Patient had minor skin issues after going home and would like this "allergy" to be taken off Carol [name] list! "Blood spots on my face"   . Cephalosporins Hives and Other (See Comments)    Can take Rocephin  . Ciprocin-Fluocin-Procin [Fluocinolone Acetonide] Hives  . Ciprofloxacin Hives  . Flagyl [Metronidazole] Other (See Comments)    Causes a greenish purple fuzz on tongue  . Hydrochlorothiazide Other (See Comments)    Sweaty, faint feeling  . Keflex [Cephalexin]   . Penicillins Hives and Other (See Comments)    Has  patient had a PCN reaction causing immediate rash, facial/tongue/throat swelling, SOB or lightheadedness with hypotension: Yes Has patient had a PCN reaction causing severe rash involving mucus membranes or skin necrosis: No Has patient had a PCN reaction that required hospitalization Yes Has patient had a PCN reaction occurring within the last 10 years: No If all of the above answers are "NO", then may proceed with Cephalosporin use.   Vitals:   05/21/18 1410  BP: (!) 131/95  Pulse: 88  Temp: 98.4 F (36.9 C)  SpO2: 98%   Physical Exam  Constitutional: Carol [name] appears well-developed and well-nourished.  Appears uncomfortable, lying on exam table  Eyes: No scleral icterus.  GI: Soft. Carol [name] exhibits no distension and no mass. There is abdominal tenderness. There is no rebound and no guarding.  At umbilical trochar site.    Assessment and plan: Carol [name] is a 33 year old female who underwent an uncomplicated laparoscopic cholecystectomy.  Carol [name] is not recovering as anticipated.  Based upon Carol [name] physical appearance, I do not suspect a bile leak however Carol [name] reports pain that seems quite out of proportion to the surgery that Carol [name] had as well as my physical examination.  We will send Carol [name] for an urgent CT scan of the abdomen to evaluate for any intra-abdominal pathology that may be contributing to Carol [name] symptoms.  I will contact Carol [name] with these results.  Given Carol [name] history of drug-seeking behavior, I am reluctant to prescribe any additional narcotic pain medication.  I have encouraged Carol [name] to utilize over-the-counter nonsteroidal anti-inflammatories.  Certainly, if Carol [name] CT scan demonstrates a more serious issue I can reconsider and perhaps give Carol [name] something stronger, such as tramadol.  I will await these results and make further decisions based upon them.

## 2018-05-21 NOTE — Patient Instructions (Addendum)
Patient will need to be scheduled for a CT Scan of the abdomen with contrast (STAT). Follow up to be arranged.  Call the office with any questions or concerns.

## 2018-06-02 ENCOUNTER — Ambulatory Visit: Payer: Self-pay | Admitting: Obstetrics & Gynecology

## 2019-02-09 ENCOUNTER — Other Ambulatory Visit: Payer: Self-pay

## 2019-02-09 DIAGNOSIS — R519 Headache, unspecified: Secondary | ICD-10-CM | POA: Insufficient documentation

## 2019-02-09 DIAGNOSIS — R1110 Vomiting, unspecified: Secondary | ICD-10-CM | POA: Insufficient documentation

## 2019-02-09 DIAGNOSIS — I10 Essential (primary) hypertension: Secondary | ICD-10-CM | POA: Insufficient documentation

## 2019-02-09 DIAGNOSIS — Z87891 Personal history of nicotine dependence: Secondary | ICD-10-CM | POA: Insufficient documentation

## 2019-02-09 DIAGNOSIS — Z79899 Other long term (current) drug therapy: Secondary | ICD-10-CM | POA: Insufficient documentation

## 2019-02-09 DIAGNOSIS — R0789 Other chest pain: Secondary | ICD-10-CM | POA: Insufficient documentation

## 2019-02-09 NOTE — ED Triage Notes (Signed)
Pt took 5 tabs of Mitragyna speciosa at 1800 today. States now has headache and blurry vision. States extremities feel cold and developed chest pain. Has vomited x 1 en route per EMS.

## 2019-02-10 ENCOUNTER — Emergency Department
Admission: EM | Admit: 2019-02-10 | Discharge: 2019-02-10 | Disposition: A | Discharge status: Home / Self Care | Payer: Self-pay | Attending: Emergency Medicine | Admitting: Emergency Medicine

## 2019-02-10 ENCOUNTER — Emergency Department: Payer: Self-pay

## 2019-02-10 DIAGNOSIS — R519 Headache, unspecified: Secondary | ICD-10-CM

## 2019-02-10 DIAGNOSIS — R079 Chest pain, unspecified: Secondary | ICD-10-CM

## 2019-02-10 DIAGNOSIS — R1110 Vomiting, unspecified: Secondary | ICD-10-CM

## 2019-02-10 LAB — TROPONIN I (HIGH SENSITIVITY)
Troponin I (High Sensitivity): 3 ng/L (ref <18)
Troponin I (High Sensitivity): 4 ng/L (ref <18)

## 2019-02-10 LAB — COMPREHENSIVE METABOLIC PANEL
ALT: 27 U/L (ref 0–44)
AST: 21 U/L (ref 15–41)
Albumin: 4.3 g/dL (ref 3.5–5.0)
Alkaline Phosphatase: 60 U/L (ref 38–126)
Anion gap: 12 (ref 5–15)
BUN: 16 mg/dL (ref 6–20)
CO2: 23 mmol/L (ref 22–32)
Calcium: 8.7 mg/dL — ABNORMAL LOW (ref 8.9–10.3)
Chloride: 105 mmol/L (ref 98–111)
Creatinine, Ser: 0.73 mg/dL (ref 0.44–1.00)
GFR calc Af Amer: >60 mL/min (ref >60)
GFR calc non Af Amer: >60 mL/min (ref >60)
Glucose, Bld: 94 mg/dL (ref 70–99)
Potassium: 3.4 mmol/L — ABNORMAL LOW (ref 3.5–5.1)
Sodium: 140 mmol/L (ref 135–145)
Total Bilirubin: 0.8 mg/dL (ref 0.3–1.2)
Total Protein: 7.3 g/dL (ref 6.5–8.1)

## 2019-02-10 LAB — CBC
HCT: 41.6 % (ref 36.0–46.0)
Hemoglobin: 14 g/dL (ref 12.0–15.0)
MCH: 29.7 pg (ref 26.0–34.0)
MCHC: 33.7 g/dL (ref 30.0–36.0)
MCV: 88.3 fL (ref 80.0–100.0)
Platelets: 318 10*3/uL (ref 150–400)
RBC: 4.71 MIL/uL (ref 3.87–5.11)
RDW: 12.1 % (ref 11.5–15.5)
WBC: 10.7 10*3/uL — ABNORMAL HIGH (ref 4.0–10.5)
nRBC: 0 % (ref 0.0–0.2)

## 2019-02-10 LAB — POCT PREGNANCY, URINE: Preg Test, Ur: NEGATIVE

## 2019-02-10 MED ORDER — ACETAMINOPHEN 500 MG PO TABS
1000.0000 mg | ORAL_TABLET | Freq: Once | ORAL | Status: AC
  Administered 2019-02-10: 03:00:00 1000 mg via ORAL
  Filled 2019-02-10: qty 2

## 2019-02-10 MED ORDER — KETOROLAC TROMETHAMINE 30 MG/ML IJ SOLN
15.0000 mg | Freq: Once | INTRAMUSCULAR | Status: AC
  Administered 2019-02-10: 15 mg via INTRAVENOUS
  Filled 2019-02-10: qty 1

## 2019-02-10 MED ORDER — SODIUM CHLORIDE 0.9 % IV BOLUS
1000.0000 mL | Freq: Once | INTRAVENOUS | Status: AC
  Administered 2019-02-10: 04:00:00 1000 mL via INTRAVENOUS

## 2019-02-10 MED ORDER — ONDANSETRON HCL 4 MG/2ML IJ SOLN
4.0000 mg | Freq: Once | INTRAMUSCULAR | Status: AC
  Administered 2019-02-10: 4 mg via INTRAVENOUS
  Filled 2019-02-10: qty 2

## 2019-02-10 NOTE — ED Provider Notes (Signed)
[facility] Emergency Department Provider Note  ____________________________________________   First MD Initiated Contact with Patient 02/10/19 0254     (approximate)  I have reviewed the triage vital signs and the nursing notes.   HISTORY  Chief Complaint Emesis    HPI Carol [name] is a 33 y.o. female with kidney stones, gallstones who underwent laparoscopic cholecystectomy on 1/6 who now presents with vomiting.  Patient took 5 tabs Mitragyna speciosa at 1800 today and then developed headache, blurry vision, chest pain.  Had one episode of vomiting.  Patient says that her headache was gradual in onset and started a few minutes after taking the pills.  She denies it being sudden in onset and severe.  Her blurry vision is better but still worse when she turns her head.  She says that she feels off balance with walking.  She does still have a little bit of chest discomfort but no shortness of breath.  She says that her walking feels off.  She denies this being an overdose attempt.  She says she was taking it for some chronic indications.  She normally takes 2 to 3 pills but has never taken this many.  She is trying to get off chronic opioids for back pain.  She says she is not been taking the opioids for the past few days.     Past Medical History:  Diagnosis Date   ADHD    Anxiety    Asthma    Bronchitis    CNS disorder    Depression    Drug-seeking behavior    Fatty liver disease, nonalcoholic    Gall stones    Gall stones    Genital warts    History of kidney stones    Hypertension    Increased frequency of headaches    Irritable bowel    Kidney stones    Major depressive disorder, recurrent severe without psychotic features (HCC)    Migraines    Urinary tract infection     Patient Active Problem List   Diagnosis Date Noted   S/P laparoscopic cholecystectomy 05/21/2018   Biliary colic 05/04/2018   Pelvic pain in  female 12/19/2016   Condyloma acuminatum of vulva 10/22/2016   MDD (major depressive disorder), recurrent severe, without psychosis (HCC) 11/01/2013   CNS disorder 07/30/2012   Borderline behavior 05/08/2012   ADHD (attention deficit hyperactivity disorder) 05/08/2012   Insomnia secondary to depression with anxiety 05/08/2012    Past Surgical History:  Procedure Laterality Date   CERVICAL BIOPSY  W/ LOOP ELECTRODE EXCISION     CHOLECYSTECTOMY N/A 05/04/2018   Procedure: LAPAROSCOPIC CHOLECYSTECTOMY;  Surgeon: Duanne Guess, MD;  Location: [facility] ORS;  Service: General;  Laterality: N/A;   CYSTOSCOPY/URETEROSCOPY/HOLMIUM LASER/STENT PLACEMENT Left 08/01/2017   Procedure: CYSTOSCOPY/URETEROSCOPY/HOLMIUM LASER/STENT PLACEMENT;  Surgeon: Vanna Scotland, MD;  Location: [facility] ORS;  Service: Urology;  Laterality: Left;   NO PAST SURGERIES      Prior to Admission medications   Medication Sig Start Date End Date Taking? Authorizing Provider  ALPRAZolam Prudy Feeler) 1 MG tablet Take 1 mg by mouth 3 (three) times daily as needed for anxiety.     [provider]  buPROPion (WELLBUTRIN XL) 150 MG 24 hr tablet Take 150 mg by mouth daily at 6 (six) AM.    [provider]  ibuprofen (ADVIL,MOTRIN) 800 MG tablet Take 1 tablet (800 mg total) by mouth every 8 (eight) hours as needed. 05/04/18   Duanne Guess, MD  oxyCODONE (  OXY IR/ROXICODONE) 5 MG immediate release tablet Take 1 tablet (5 mg total) by mouth every 6 (six) hours as needed for severe pain. 05/04/18   Fredirick Maudlin, MD  tiZANidine (ZANAFLEX) 4 MG capsule Take 4 mg by mouth 3 (three) times daily as needed for muscle spasms.     [provider]    Allergies Compazine [prochlorperazine], Shellfish allergy, Sulfa antibiotics, Sulfasalazine, Erythromycin, Pseudoephedrine, Anesthesia s-i-60, Cephalosporins, Ciprocin-fluocin-procin [fluocinolone acetonide], Ciprofloxacin, Flagyl [metronidazole], Hydrochlorothiazide,  Keflex [cephalexin], and Penicillins  Family History  Problem Relation Age of Onset   Physical abuse Mother    Anxiety disorder Mother    ADD / ADHD Mother    Paranoid behavior Mother    Hypertension Mother    Drug abuse Father    Depression Father    Alcohol abuse Maternal Grandfather    ADD / ADHD Maternal Grandmother    Anxiety disorder Maternal Grandmother    Dementia Maternal Grandmother    OCD Maternal Grandmother    Sexual abuse Maternal Grandmother    Alcohol abuse Paternal Grandfather    Bipolar disorder Cousin    Schizophrenia Neg Hx    Seizures Neg Hx     Social History Social History   Tobacco Use   Smoking status: Former Smoker    Packs/day: 0.25    Types: Cigarettes   Smokeless tobacco: Never Used  Substance Use Topics   Alcohol use: No   Drug use: No      Review of Systems Constitutional: No fever/chills Eyes: Positive blurry vision now resolved ENT: No sore throat. Cardiovascular: Positive chest pain Respiratory: Denies shortness of breath. Gastrointestinal: No abdominal pain.  Positive vomiting no diarrhea.  No constipation. Genitourinary: Negative for dysuria. Musculoskeletal: Negative for back pain. Skin: Negative for rash. Neurological: Positive headache and difficulty walking All other ROS negative ____________________________________________   PHYSICAL EXAM:  VITAL SIGNS: ED Triage Vitals  Enc Vitals Group     BP 02/09/19 2358 (!) 154/101     Pulse Rate 02/09/19 2358 (!) 102     Resp 02/09/19 2358 20     Temp 02/09/19 2358 98.3 F (36.8 C)     Temp Source 02/09/19 2358 Oral     SpO2 02/09/19 2358 98 %     Weight 02/09/19 2359 230 lb (104.3 kg)     Height 02/09/19 2359 5\' 4"  (1.626 m)     Head Circumference --      Peak Flow --      Pain Score 02/09/19 2359 8     Pain Loc --      Pain Edu? --      Excl. in [place]? --     Constitutional: Alert and oriented. Well appearing and in no acute distress. Eyes:  Conjunctivae are normal. EOMI. Head: Atraumatic. Nose: No congestion/rhinnorhea. Mouth/Throat: Mucous membranes are moist.   Neck: No stridor. Trachea Midline. FROM Cardiovascular: Tachycardic, regular rhythm. Grossly normal heart sounds.  Good peripheral circulation. Respiratory: Normal respiratory effort.  No retractions. Lungs CTAB. Gastrointestinal: Soft and nontender. No distention. No abdominal bruits.  Musculoskeletal: No lower extremity tenderness nor edema.  No joint effusions. Neurologic:  Normal speech and language. No gross focal neurologic deficits are appreciated.  Cranial nerves II through XII are intact.  Does have a slight tremor of her tongue. Skin:  Skin is warm, dry and intact. No rash noted. Psychiatric: Mood and affect are normal. Speech and behavior are normal. GU: Deferred   ____________________________________________   LABS (all labs ordered are  listed, but only abnormal results are displayed)  Labs Reviewed  CBC - Abnormal; Notable for the following components:      Result Value   WBC 10.7 (*)    All other components within normal limits  COMPREHENSIVE METABOLIC PANEL - Abnormal; Notable for the following components:   Potassium 3.4 (*)    Calcium 8.7 (*)    All other components within normal limits  POC URINE PREG, ED  POCT PREGNANCY, URINE  TROPONIN I (HIGH SENSITIVITY)  TROPONIN I (HIGH SENSITIVITY)  TROPONIN I (HIGH SENSITIVITY)   ____________________________________________   ED ECG REPORT I, Concha Se, the attending physician, personally viewed and interpreted this ECG.  EKG is sinus tachycardia rate of 103, no ST elevation, no T wave inversion, normal intervals ____________________________________________  RADIOLOGY   Official radiology report(s): Ct Head Wo Contrast  Result Date: 02/10/2019 CLINICAL DATA:  33 year old female with acute headache and blurred vision. Took Mitragyna speciosa. Vomited once. EXAM: CT HEAD WITHOUT  CONTRAST TECHNIQUE: Contiguous axial images were obtained from the base of the skull through the vertex without intravenous contrast. COMPARISON:  Head CT 02/16/2017. FINDINGS: Brain: Cerebral volume is stable. No midline shift, ventriculomegaly, mass effect, evidence of mass lesion, intracranial hemorrhage or evidence of cortically based acute infarction. Gray-white matter differentiation is within normal limits throughout the brain. Vascular: No suspicious intracranial vascular hyperdensity. Skull: No acute osseous abnormality identified. Sinuses/Orbits: Visualized paranasal sinuses and mastoids are stable and well pneumatized. Other: Visualized orbits and scalp soft tissues are within normal limits. IMPRESSION: Stable and negative noncontrast CT appearance of the brain. Electronically Signed   By: Odessa Fleming M.D.   On: 02/10/2019 03:24    ____________________________________________   PROCEDURES  Procedure(s) performed (including Critical Care):  Procedures   ____________________________________________   INITIAL IMPRESSION / ASSESSMENT AND PLAN / ED COURSE  Carol [name] was evaluated in Emergency Department on 02/10/2019 for the symptoms described in the history of present illness. She was evaluated in the context of the global COVID-19 pandemic, which necessitated consideration that the patient might be at risk for infection with the SARS-CoV-2 virus that causes COVID-19. Institutional protocols and algorithms that pertain to the evaluation of patients at risk for COVID-19 are in a state of rapid change based on information released by regulatory bodies including the CDC and federal and state organizations. These policies and algorithms were followed during the patient's care in the ED.    Patient presents with multiple symptoms after taking more than she supposed to of kratom.  Nurse discussed with poison control and patient can have palpitations, seizures, nausea, hallucinations,  confusion and psychosis.  They recommended fluid hydration antiemetics and observe for 5 hours or until at baseline.  Will get labs to evaluate for liver dysfunction, AKI.  Patient does have a headache.  Patient's description of her headache does not sound consistent with a subarachnoid hemorrhage given it was gradually progressing since the onset of taking these medications.  Patient however is concerned therefore will get CT scan to make sure there is no large bleed, intracranial mass.  Patient only vomited once therefore low suspicion chest pain is a sign of esophageal rupture.  Patient is clear lungs bilaterally so low suspicion for pneumothorax.  Patient mostly focuses on her headache at this time.  Labs are reassuring with potassium at 3.4 otherwise normal liver function.  White count slightly elevated 10.7.  Initial troponin was 3.  Repeat troponin was negative.  5:24 AM reevaluated patient feeling  much better.  Patient was able to ambulate and denies any blurry vision.  I had extensive conversation with patient about her headache.  We discussed if this was a subarachnoid that the CT scan was done within 9 hours of symptom onset and there still is a chance that even if negative somebody could have a subarachnoid hemorrhage.  We discussed the option of lumbar puncture and the benefits and risk of this procedure.  Given her headache does not seem consistent with a subarachnoid and her symptoms seem to coincide side with this medication patient would like to hold off on lumbar puncture at this time.  This is reasonable given the low suspicion.  Still has a 5 out of 10 headache and requesting some Toradol and zofran.   Evaluated patient headache is now relieved.  Patient is requesting to go home at this time.  She says when she stands up she felt a slight headache felt like a hangover headache was able to ambulate well without any issues.  We again discussed CTA, LP for evaluation of subarachnoid but again  patient declined given very minimal headache and the presentation of the headache which is very low risk for subarachnoid hemorrhage.  Patient would prefer to go home at this time and will return to the ER if she develops any other new symptoms.  Blood pressure 117/76, pulse 67, temperature (!) 97.2 F (36.2 C), resp. rate 16, height 5\' 4"  (1.626 m), weight 104.3 kg, last menstrual period 01/11/2019, SpO2 97 %.  I discussed the provisional nature of ED diagnosis, the treatment so far, the ongoing plan of care, follow up appointments and return precautions with the patient and any family or support people present. They expressed understanding and agreed with the plan, discharged home.   ____________________________________________   FINAL CLINICAL IMPRESSION(S) / ED DIAGNOSES   Final diagnoses:  Intractable headache, unspecified chronicity pattern, unspecified headache type  Vomiting, intractability of vomiting not specified, presence of nausea not specified, unspecified vomiting type  Chest pain, unspecified type      MEDICATIONS GIVEN DURING THIS VISIT:  Medications  sodium chloride 0.9 % bolus 1,000 mL (0 mLs Intravenous Stopped 02/10/19 0420)  acetaminophen (TYLENOL) tablet 1,000 mg (1,000 mg Oral Given 02/10/19 0327)  ketorolac (TORADOL) 30 MG/ML injection 15 mg (15 mg Intravenous Given 02/10/19 0536)  ondansetron (ZOFRAN) injection 4 mg (4 mg Intravenous Given 02/10/19 0536)     ED Discharge Orders    None       Note:  This document was prepared using Dragon voice recognition software and may include unintentional dictation errors.   Concha SeFunke, Remie Mathison E, MD 02/10/19 [phone]

## 2019-02-10 NOTE — ED Notes (Signed)
Danielle at [place] control contacted, can have palpitations, seizures, nausea, hallucinations, confusion and psychosis. Get liver enzymes, protect airway, check electrolytes. Fluid hydration, antiemetics, and observe for 5 hrs after ingestion or until at baseline.

## 2019-02-10 NOTE — Discharge Instructions (Signed)
Your work-up is reassuring including negative CT scan of your head.  We felt this was more likely secondary to the medications that you used versus a bleed in your brain.  However if you develop worsening symptoms, vomiting, fevers or any other concerns you should return to the ER immediately.  Discontinue the Kratom

## 2019-02-10 NOTE — ED Notes (Signed)
Per Danielle at poison control, case is closed.

## 2019-04-19 IMAGING — US US ABDOMEN LIMITED
1 series · 14 of 25 positions shown · non-contrast
Comparison: Prior ultrasound from 09/18/2017.

CLINICAL DATA: Initial evaluation for acute generalized abdominal
pain.

EXAM:
ULTRASOUND ABDOMEN LIMITED RIGHT UPPER QUADRANT

[Series 1: us abdomen limited · 14 of 62 slices shown]
[im 1/62]
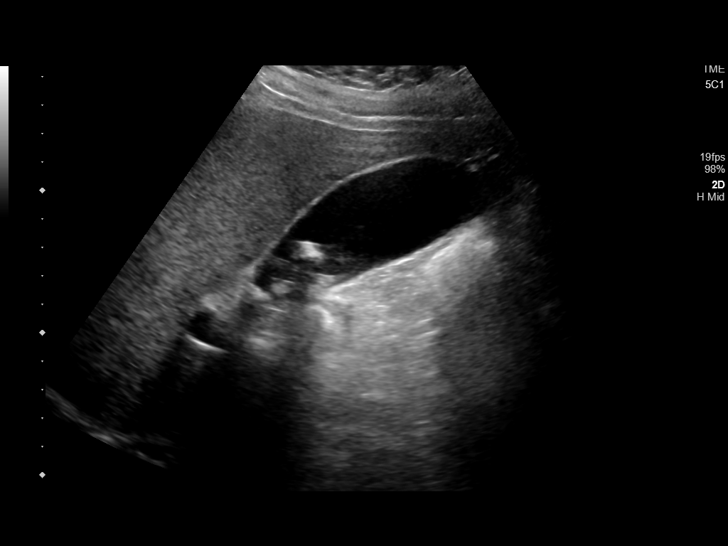
[im 6/62]
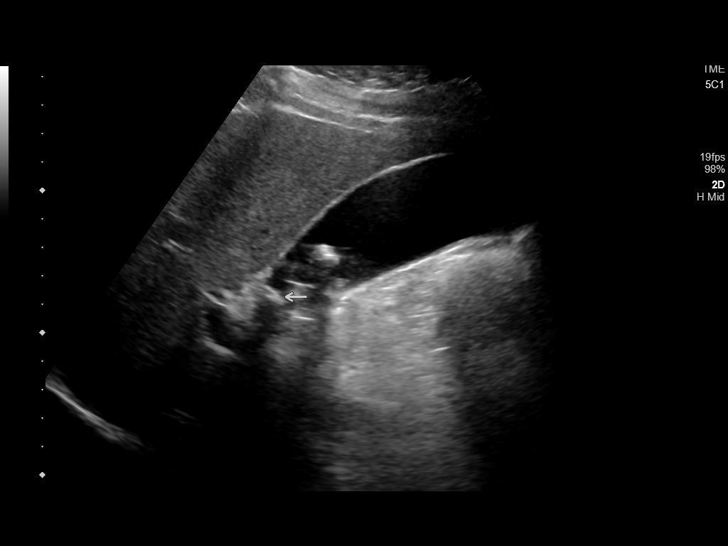
[im 11/62]
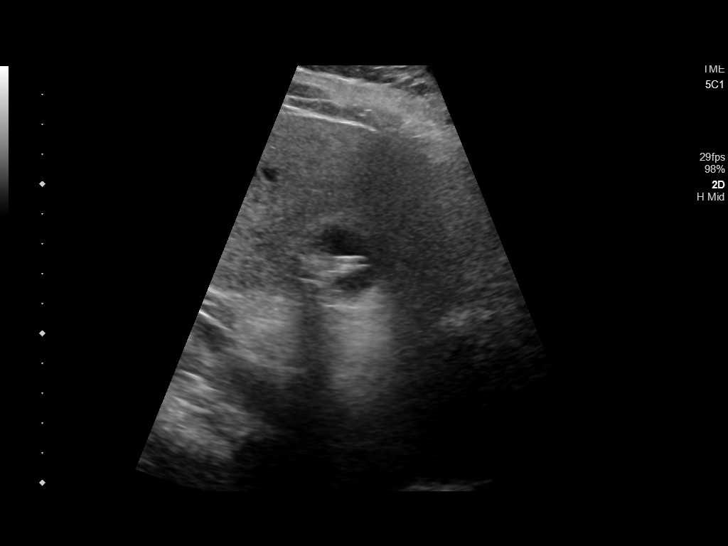
[im 16/62]
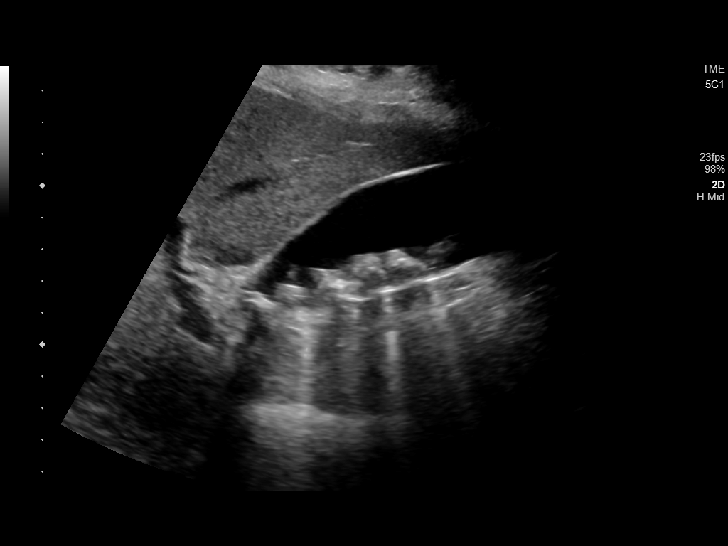
[im 21/62]
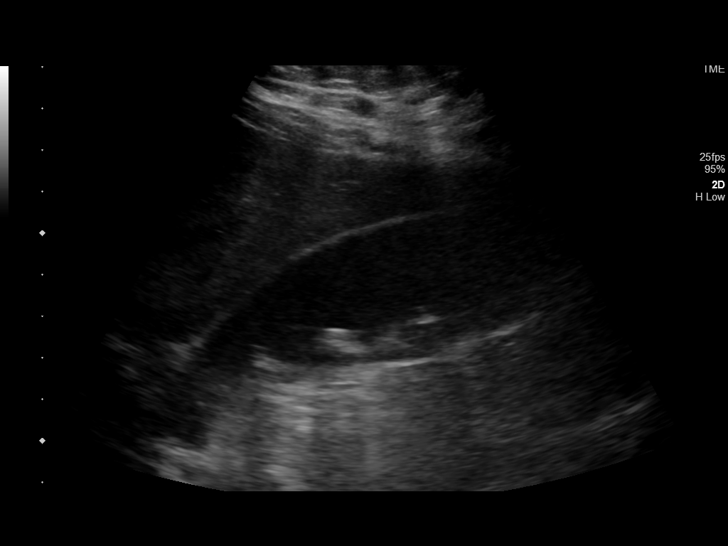
[im 23/62]
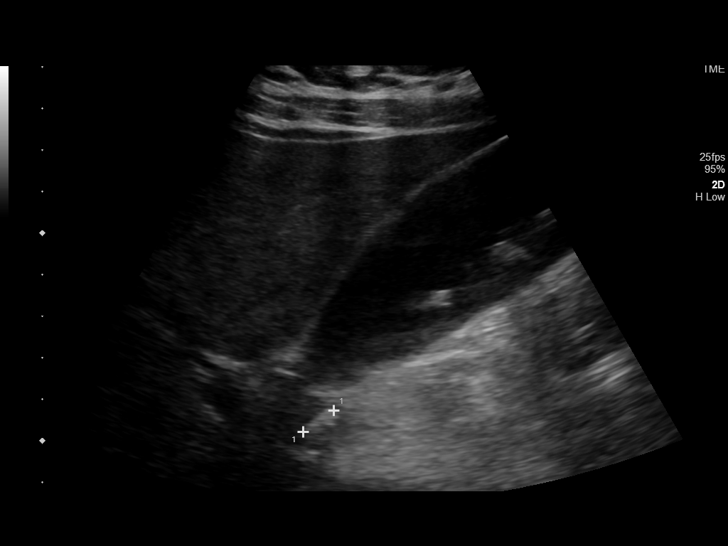
[im 28/62]
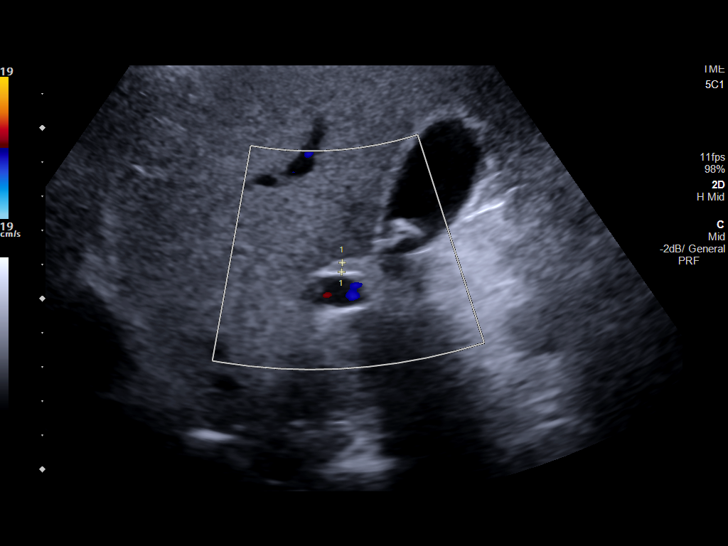
[im 34/62]
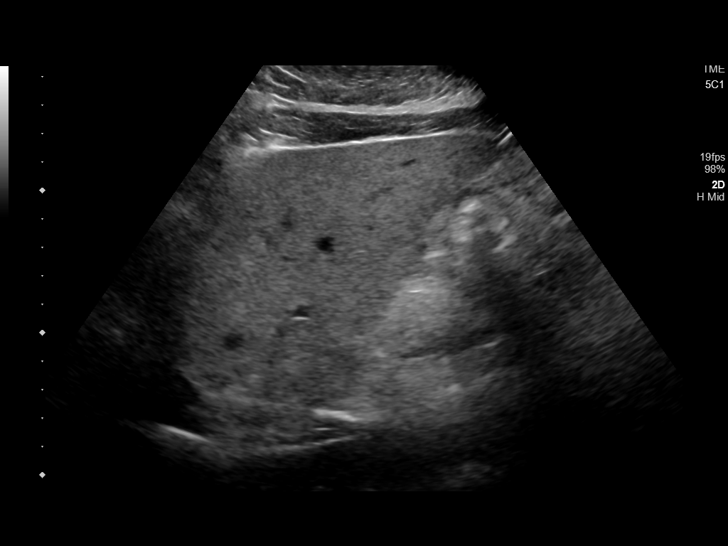
[im 39/62]
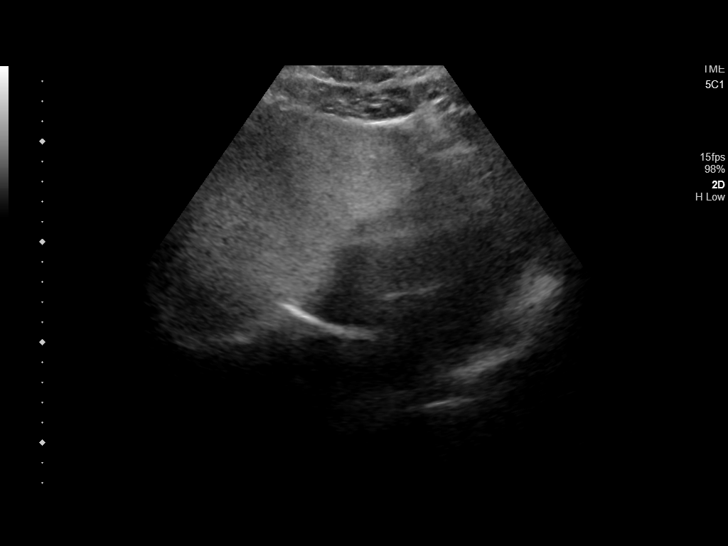
[im 41/62]
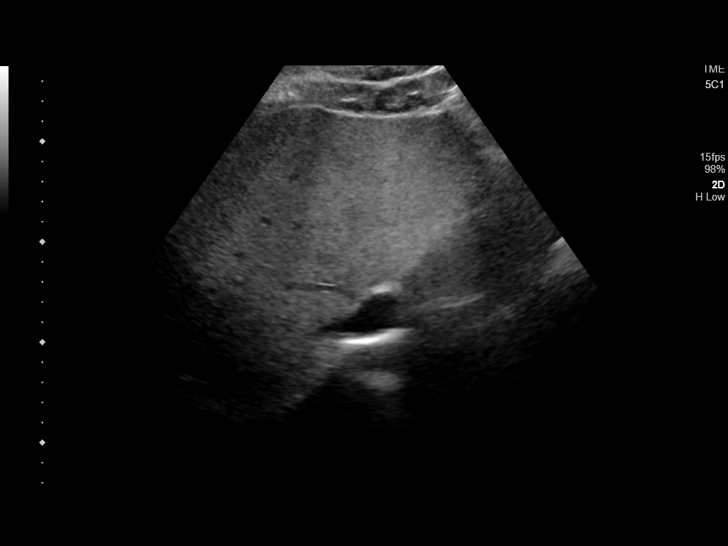
[im 46/62]
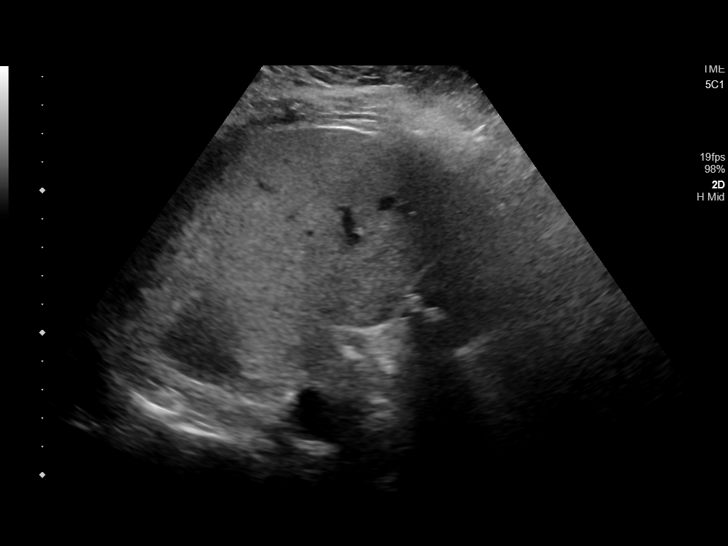
[im 51/62]
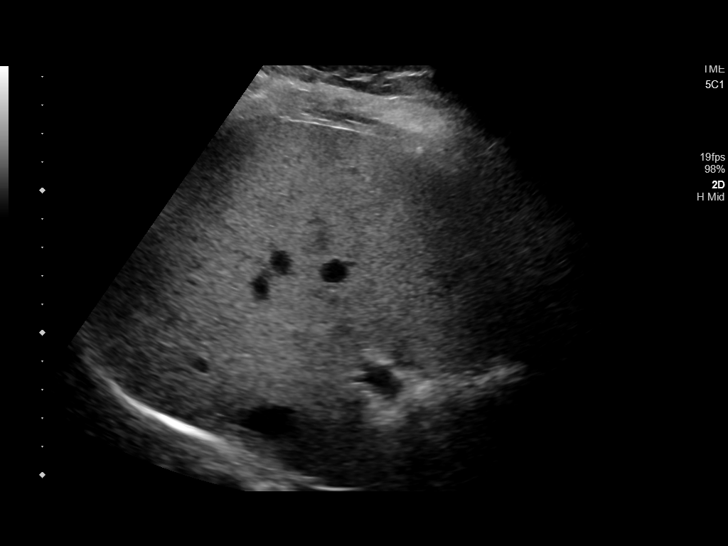
[im 56/62]
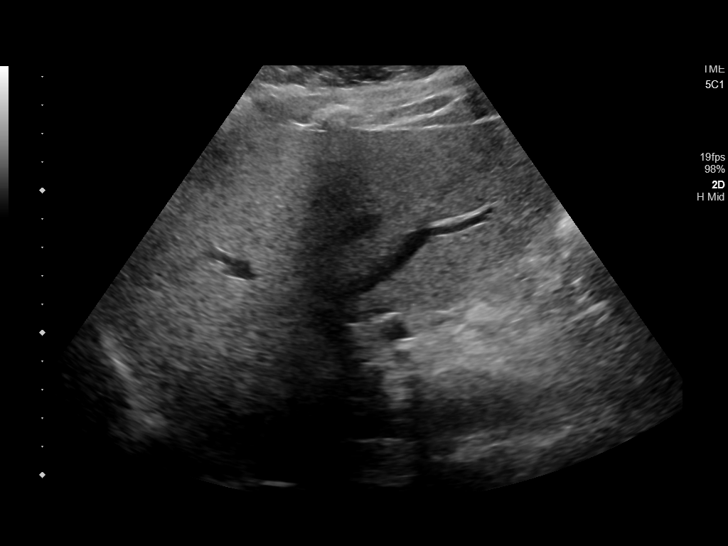
[im 62/62]
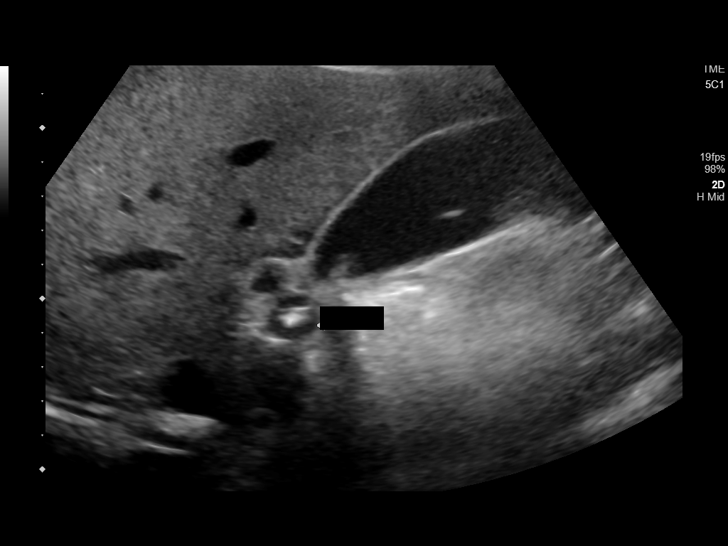

[14 of 25 positions shown; findings below may reference images not displayed]

FINDINGS: Gallbladder:

Multiple echogenic stones seen within the gallbladder lumen, largest
of which measures approximately 1 cm in size. At least 2 of the
stones appear nonmobile and lodged at the gallbladder neck.
Gallbladder wall measure within normal limits of 1.9 mm. No free
pericholecystic fluid. Positive sonographic Murphy sign elicited on
exam.

Common bile duct:

Diameter: 2.7 mm

Liver:

No focal lesion identified. Diffusely increased echogenicity within
the hepatic parenchyma, suggesting steatosis. Portal vein is patent
on color Doppler imaging with normal direction of blood flow towards
the liver.
IMPRESSION: 1. Cholelithiasis with at least 2 nonmobile stones lodged at the
gallbladder neck, with associated positive sonographic Murphy sign.
No other sonographic features to suggest acute cholecystitis.
2. No biliary dilatation.
3. Hepatic steatosis.

## 2019-08-27 ENCOUNTER — Other Ambulatory Visit: Payer: Self-pay

## 2019-08-27 ENCOUNTER — Emergency Department
Admission: EM | Admit: 2019-08-27 | Discharge: 2019-08-27 | Disposition: A | Discharge status: Home / Self Care | Payer: BC Managed Care – PPO | Attending: Emergency Medicine | Admitting: Emergency Medicine

## 2019-08-27 ENCOUNTER — Encounter: Payer: Self-pay | Admitting: Emergency Medicine

## 2019-08-27 DIAGNOSIS — I10 Essential (primary) hypertension: Secondary | ICD-10-CM | POA: Diagnosis not present

## 2019-08-27 DIAGNOSIS — Z9049 Acquired absence of other specified parts of digestive tract: Secondary | ICD-10-CM | POA: Diagnosis not present

## 2019-08-27 DIAGNOSIS — J45909 Unspecified asthma, uncomplicated: Secondary | ICD-10-CM | POA: Insufficient documentation

## 2019-08-27 DIAGNOSIS — Z87891 Personal history of nicotine dependence: Secondary | ICD-10-CM | POA: Diagnosis not present

## 2019-08-27 DIAGNOSIS — Z79899 Other long term (current) drug therapy: Secondary | ICD-10-CM | POA: Insufficient documentation

## 2019-08-27 DIAGNOSIS — R1084 Generalized abdominal pain: Secondary | ICD-10-CM | POA: Diagnosis not present

## 2019-08-27 DIAGNOSIS — R1030 Lower abdominal pain, unspecified: Secondary | ICD-10-CM | POA: Diagnosis present

## 2019-08-27 LAB — COMPREHENSIVE METABOLIC PANEL
ALT: 27 U/L (ref 0–44)
AST: 19 U/L (ref 15–41)
Albumin: 4 g/dL (ref 3.5–5.0)
Alkaline Phosphatase: 55 U/L (ref 38–126)
Anion gap: 7 (ref 5–15)
BUN: 13 mg/dL (ref 6–20)
CO2: 25 mmol/L (ref 22–32)
Calcium: 8.8 mg/dL — ABNORMAL LOW (ref 8.9–10.3)
Chloride: 107 mmol/L (ref 98–111)
Creatinine, Ser: 0.74 mg/dL (ref 0.44–1.00)
GFR calc Af Amer: >60 mL/min (ref >60)
GFR calc non Af Amer: >60 mL/min (ref >60)
Glucose, Bld: 108 mg/dL — ABNORMAL HIGH (ref 70–99)
Potassium: 3.8 mmol/L (ref 3.5–5.1)
Sodium: 139 mmol/L (ref 135–145)
Total Bilirubin: 0.7 mg/dL (ref 0.3–1.2)
Total Protein: 7.3 g/dL (ref 6.5–8.1)

## 2019-08-27 LAB — CBC WITH DIFFERENTIAL/PLATELET
Abs Immature Granulocytes: 0.02 10*3/uL (ref 0.00–0.07)
Basophils Absolute: 0 10*3/uL (ref 0.0–0.1)
Basophils Relative: 0 %
Eosinophils Absolute: 0.3 10*3/uL (ref 0.0–0.5)
Eosinophils Relative: 3 %
HCT: 44.9 % (ref 36.0–46.0)
Hemoglobin: 15.3 g/dL — ABNORMAL HIGH (ref 12.0–15.0)
Immature Granulocytes: 0 %
Lymphocytes Relative: 36 %
Lymphs Abs: 3.2 10*3/uL (ref 0.7–4.0)
MCH: 30.1 pg (ref 26.0–34.0)
MCHC: 34.1 g/dL (ref 30.0–36.0)
MCV: 88.2 fL (ref 80.0–100.0)
Monocytes Absolute: 0.9 10*3/uL (ref 0.1–1.0)
Monocytes Relative: 11 %
Neutro Abs: 4.4 10*3/uL (ref 1.7–7.7)
Neutrophils Relative %: 50 %
Platelets: 338 10*3/uL (ref 150–400)
RBC: 5.09 MIL/uL (ref 3.87–5.11)
RDW: 11.9 % (ref 11.5–15.5)
WBC: 8.9 10*3/uL (ref 4.0–10.5)
nRBC: 0 % (ref 0.0–0.2)

## 2019-08-27 LAB — LIPASE, BLOOD: Lipase: 29 U/L (ref 11–51)

## 2019-08-27 MED ORDER — DICYCLOMINE HCL 10 MG PO CAPS: 10.0000 mg | ORAL_CAPSULE | Freq: Three times a day (TID) | ORAL | 0 refills | Status: AC | PRN

## 2019-08-27 MED ORDER — DICYCLOMINE HCL 10 MG PO CAPS
20.0000 mg | ORAL_CAPSULE | Freq: Once | ORAL | Status: AC
  Administered 2019-08-27: 20 mg via ORAL
  Filled 2019-08-27: qty 2

## 2019-08-27 NOTE — ED Provider Notes (Signed)
[facility] Emergency Department Provider Note   ____________________________________________   I have reviewed the triage vital signs and the nursing notes.   HISTORY  Chief Complaint Abdominal Pain   History limited by: Not Limited    HPI Carol [name] is a 34 y.o. female who presents to the emergency department today because of concerns for abdominal pain.  She states that she had the start of some abdominal discomfort last night after eating [employer].  Initially was in her upper abdomen.  This morning upon awakening she started having pain more in her lower abdomen.  She states the pain does move around.  It has been accompanied by nausea although she has had no vomiting.  She states she has also not had a bowel movement or has been able to urinate this morning.  Patient denies any history of IBS or Crohn's type disease.  Did have gallbladder surgery 1 year ago.  Denies any fevers.  No known sick contacts.   Records reviewed. Per medical record review patient has a history of kidney stones  Past Medical History:  Diagnosis Date  . ADHD   . Anxiety   . Asthma   . Bronchitis   . CNS disorder   . Depression   . Drug-seeking behavior   . Fatty liver disease, nonalcoholic   . Gall stones   . Gall stones   . Genital warts   . History of kidney stones   . Hypertension   . Increased frequency of headaches   . Irritable bowel   . Kidney stones   . Major depressive disorder, recurrent severe without psychotic features (HCC)   . Migraines   . Urinary tract infection     Patient Active Problem List   Diagnosis Date Noted  . S/P laparoscopic cholecystectomy 05/21/2018  . Biliary colic 05/04/2018  . Pelvic pain in female 12/19/2016  . Condyloma acuminatum of vulva 10/22/2016  . MDD (major depressive disorder), recurrent severe, without psychosis (HCC) 11/01/2013  . 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 Borderline behavior 05/08/2012  . ADHD (attention  deficit hyperactivity disorder) 05/08/2012  . Insomnia secondary to depression with anxiety 05/08/2012    Past Surgical History:  Procedure Laterality Date  . CERVICAL BIOPSY  W/ LOOP ELECTRODE EXCISION    . CHOLECYSTECTOMY N/A 05/04/2018   Procedure: LAPAROSCOPIC CHOLECYSTECTOMY;  Surgeon: Duanne Guess, MD;  Location: [facility] ORS;  Service: General;  Laterality: N/A;  . CYSTOSCOPY/URETEROSCOPY/HOLMIUM LASER/STENT PLACEMENT Left 08/01/2017   Procedure: CYSTOSCOPY/URETEROSCOPY/HOLMIUM LASER/STENT PLACEMENT;  Surgeon: Vanna Scotland, MD;  Location: [facility] ORS;  Service: Urology;  Laterality: Left;  . NO PAST SURGERIES      Prior to Admission medications   Medication Sig Start Date End Date Taking? Authorizing Provider  ALPRAZolam Prudy Feeler) 1 MG tablet Take 1 mg by mouth 3 (three) times daily as needed for anxiety.     [provider]  buPROPion (WELLBUTRIN XL) 150 MG 24 hr tablet Take 150 mg by mouth daily at 6 (six) AM.    [provider]  ibuprofen (ADVIL,MOTRIN) 800 MG tablet Take 1 tablet (800 mg total) by mouth every 8 (eight) hours as needed. 05/04/18   Duanne Guess, MD  oxyCODONE (OXY IR/ROXICODONE) 5 MG immediate release tablet Take 1 tablet (5 mg total) by mouth every 6 (six) hours as needed for severe pain. 05/04/18   Duanne Guess, MD  tiZANidine (ZANAFLEX) 4 MG capsule Take 4 mg by mouth 3 (three) times daily as needed for muscle spasms.  [provider]    Allergies Compazine [prochlorperazine], Shellfish allergy, Sulfa antibiotics, Sulfasalazine, Erythromycin, Pseudoephedrine, Cephalosporins, Ciprocin-fluocin-procin [fluocinolone acetonide], Ciprofloxacin, Flagyl [metronidazole], Hydrochlorothiazide, Keflex [cephalexin], Penicillins, and Propofol  Family History  Problem Relation Age of Onset  . Physical abuse Mother   . Anxiety disorder Mother   . ADD / ADHD Mother   . Paranoid behavior Mother   . Hypertension Mother   . Drug abuse Father   .  Depression Father   . Alcohol abuse Maternal Grandfather   . ADD / ADHD Maternal Grandmother   . Anxiety disorder Maternal Grandmother   . Dementia Maternal Grandmother   . OCD Maternal Grandmother   . Sexual abuse Maternal Grandmother   . Alcohol abuse Paternal Grandfather   . Bipolar disorder Cousin   . Schizophrenia Neg Hx   . Seizures Neg Hx     Social History Social History   Tobacco Use  . Smoking status: Former Smoker    Packs/day: 0.25    Types: Cigarettes  . Smokeless tobacco: Never Used  Substance Use Topics  . Alcohol use: No  . Drug use: No    Review of Systems Constitutional: No fever/chills Eyes: No visual changes. ENT: No sore throat. Cardiovascular: Denies chest pain. Respiratory: Denies shortness of breath. Gastrointestinal: Positive for abdominal pain and nausea.  Genitourinary: Negative for dysuria. Musculoskeletal: Negative for back pain. Skin: Negative for rash. Neurological: Negative for headaches, focal weakness or numbness.  ____________________________________________   PHYSICAL EXAM:  VITAL SIGNS: ED Triage Vitals  Enc Vitals Group     BP 08/27/19 0630 (!) 146/93     Pulse Rate 08/27/19 0630 89     Resp 08/27/19 0630 18     Temp 08/27/19 0630 98 F (36.7 C)     Temp src --      SpO2 08/27/19 0630 97 %     Weight 08/27/19 0628 234 lb (106.1 kg)     Height 08/27/19 0628 5\' 6"  (1.676 m)     Head Circumference --      Peak Flow --      Pain Score 08/27/19 0627 8   Constitutional: Alert and oriented.  Eyes: Conjunctivae are normal.  ENT      Head: Normocephalic and atraumatic.      Nose: No congestion/rhinnorhea.      Mouth/Throat: Mucous membranes are moist.      Neck: No stridor. Hematological/Lymphatic/Immunilogical: No cervical lymphadenopathy. Cardiovascular: Normal rate, regular rhythm.  No murmurs, rubs, or gallops.  Respiratory: Normal respiratory effort without tachypnea nor retractions. Breath sounds are clear and  equal bilaterally. No wheezes/rales/rhonchi. Gastrointestinal: Soft and slightly tender to palpation diffusely. Genitourinary: Deferred Musculoskeletal: Normal range of motion in all extremities. No lower extremity edema. Neurologic:  Normal speech and language. No gross focal neurologic deficits are appreciated.  Skin:  Skin is warm, dry and intact. No rash noted. Psychiatric: Mood and affect are normal. Speech and behavior are normal. Patient exhibits appropriate insight and judgment.  ____________________________________________    LABS (pertinent positives/negatives)  Lipase 29 CMP wnl except glu 108, ca 8.8 CBC wbc 8.9, hgb 15.3, plt 338  ____________________________________________   EKG  None  ____________________________________________    RADIOLOGY  None  ____________________________________________   PROCEDURES  Procedures  ____________________________________________   INITIAL IMPRESSION / ASSESSMENT AND PLAN / ED COURSE  Pertinent labs & imaging results that were available during my care of the patient were reviewed by me and considered in my medical decision making (see chart for  details).   Patient presented to the emergency department today because of concerns for abdominal pain.  Patient does describe it as moving around and on exam she has some mild tenderness somewhat diffusely throughout the abdomen.  No specific tenderness in the right lower quadrant.  Patient's blood work without any concerning leukocytosis.  Patient is afebrile.  At this point I doubt significant intra-abdominal infection.  She did feel some relief with Bentyl.  Did discuss possibility of food poisoning/gastroenteritis with the patient.  She did not want to wait to give a urine sample.  Did discuss return precautions with patient. ____________________________________________   FINAL CLINICAL IMPRESSION(S) / ED DIAGNOSES  Final diagnoses:  Generalized abdominal pain      Note: This dictation was prepared with Dragon dictation. Any transcriptional errors that result from this process are unintentional     Phineas Semen, MD 08/27/19 [phone]

## 2019-08-27 NOTE — ED Triage Notes (Signed)
Patient ambulatory to triage with steady gait, without difficulty or distress noted, mask in place; pt reports mid lower abd pain upon awakening hr PTA accomp by nausea; denies hx of same

## 2019-08-27 NOTE — Discharge Instructions (Addendum)
Please seek medical attention for any high fevers, chest pain, shortness of breath, change in behavior, persistent vomiting, bloody stool or any other new or concerning symptoms.  

## 2019-08-27 NOTE — ED Notes (Signed)
Pt cannot provide urine sample at this time. Pt refusing in and out cath to obtain sample at this time. Pt does not wish to wait until she can provide urine sample. MD aware.

## 2019-11-15 ENCOUNTER — Emergency Department (HOSPITAL_BASED_OUTPATIENT_CLINIC_OR_DEPARTMENT_OTHER)
Admission: EM | Admit: 2019-11-15 | Discharge: 2019-11-15 | Disposition: A | Discharge status: Home / Self Care | Payer: BC Managed Care – PPO | Attending: Emergency Medicine | Admitting: Emergency Medicine

## 2019-11-15 ENCOUNTER — Other Ambulatory Visit: Payer: Self-pay

## 2019-11-15 ENCOUNTER — Encounter (HOSPITAL_BASED_OUTPATIENT_CLINIC_OR_DEPARTMENT_OTHER): Payer: Self-pay | Admitting: Emergency Medicine

## 2019-11-15 ENCOUNTER — Emergency Department (HOSPITAL_BASED_OUTPATIENT_CLINIC_OR_DEPARTMENT_OTHER): Payer: BC Managed Care – PPO

## 2019-11-15 DIAGNOSIS — J45909 Unspecified asthma, uncomplicated: Secondary | ICD-10-CM | POA: Insufficient documentation

## 2019-11-15 DIAGNOSIS — I10 Essential (primary) hypertension: Secondary | ICD-10-CM | POA: Insufficient documentation

## 2019-11-15 DIAGNOSIS — R1114 Bilious vomiting: Secondary | ICD-10-CM

## 2019-11-15 DIAGNOSIS — G43909 Migraine, unspecified, not intractable, without status migrainosus: Secondary | ICD-10-CM

## 2019-11-15 DIAGNOSIS — R519 Headache, unspecified: Secondary | ICD-10-CM | POA: Diagnosis present

## 2019-11-15 DIAGNOSIS — Z87891 Personal history of nicotine dependence: Secondary | ICD-10-CM | POA: Diagnosis not present

## 2019-11-15 LAB — COMPREHENSIVE METABOLIC PANEL
ALT: 39 U/L (ref 0–44)
AST: 26 U/L (ref 15–41)
Albumin: 4.4 g/dL (ref 3.5–5.0)
Alkaline Phosphatase: 56 U/L (ref 38–126)
Anion gap: 10 (ref 5–15)
BUN: 11 mg/dL (ref 6–20)
CO2: 24 mmol/L (ref 22–32)
Calcium: 8.8 mg/dL — ABNORMAL LOW (ref 8.9–10.3)
Chloride: 104 mmol/L (ref 98–111)
Creatinine, Ser: 0.72 mg/dL (ref 0.44–1.00)
GFR calc Af Amer: >60 mL/min (ref >60)
GFR calc non Af Amer: >60 mL/min (ref >60)
Glucose, Bld: 115 mg/dL — ABNORMAL HIGH (ref 70–99)
Potassium: 4.2 mmol/L (ref 3.5–5.1)
Sodium: 138 mmol/L (ref 135–145)
Total Bilirubin: 0.7 mg/dL (ref 0.3–1.2)
Total Protein: 7.9 g/dL (ref 6.5–8.1)

## 2019-11-15 LAB — CBC WITH DIFFERENTIAL/PLATELET
Abs Immature Granulocytes: 0.06 10*3/uL (ref 0.00–0.07)
Basophils Absolute: 0 10*3/uL (ref 0.0–0.1)
Basophils Relative: 0 %
Eosinophils Absolute: 0.2 10*3/uL (ref 0.0–0.5)
Eosinophils Relative: 2 %
HCT: 46.4 % — ABNORMAL HIGH (ref 36.0–46.0)
Hemoglobin: 15.7 g/dL — ABNORMAL HIGH (ref 12.0–15.0)
Immature Granulocytes: 1 %
Lymphocytes Relative: 24 %
Lymphs Abs: 2.2 10*3/uL (ref 0.7–4.0)
MCH: 29.8 pg (ref 26.0–34.0)
MCHC: 33.8 g/dL (ref 30.0–36.0)
MCV: 88.2 fL (ref 80.0–100.0)
Monocytes Absolute: 0.7 10*3/uL (ref 0.1–1.0)
Monocytes Relative: 8 %
Neutro Abs: 5.9 10*3/uL (ref 1.7–7.7)
Neutrophils Relative %: 65 %
Platelets: 336 10*3/uL (ref 150–400)
RBC: 5.26 MIL/uL — ABNORMAL HIGH (ref 3.87–5.11)
RDW: 11.7 % (ref 11.5–15.5)
WBC: 9.1 10*3/uL (ref 4.0–10.5)
nRBC: 0 % (ref 0.0–0.2)

## 2019-11-15 LAB — RAPID URINE DRUG SCREEN, HOSP PERFORMED
Amphetamines: NOT DETECTED
Barbiturates: NOT DETECTED
Benzodiazepines: NOT DETECTED
Cocaine: NOT DETECTED
Opiates: NOT DETECTED
Tetrahydrocannabinol: POSITIVE — AB

## 2019-11-15 LAB — LIPASE, BLOOD: Lipase: 28 U/L (ref 11–51)

## 2019-11-15 LAB — HCG, SERUM, QUALITATIVE: Preg, Serum: NEGATIVE

## 2019-11-15 MED ORDER — METOCLOPRAMIDE HCL 5 MG/ML IJ SOLN
10.0000 mg | Freq: Once | INTRAMUSCULAR | Status: AC
  Administered 2019-11-15: 10 mg via INTRAVENOUS
  Filled 2019-11-15: qty 2

## 2019-11-15 MED ORDER — SODIUM CHLORIDE 0.9 % IV BOLUS
1000.0000 mL | Freq: Once | INTRAVENOUS | Status: AC
  Administered 2019-11-15: 1000 mL via INTRAVENOUS

## 2019-11-15 MED ORDER — LACTATED RINGERS IV BOLUS
1000.0000 mL | Freq: Once | INTRAVENOUS | Status: AC
  Administered 2019-11-15: 1000 mL via INTRAVENOUS

## 2019-11-15 MED ORDER — DIPHENHYDRAMINE HCL 50 MG/ML IJ SOLN
25.0000 mg | Freq: Once | INTRAMUSCULAR | Status: AC
  Administered 2019-11-15: 25 mg via INTRAVENOUS
  Filled 2019-11-15: qty 1

## 2019-11-15 MED ORDER — HALOPERIDOL LACTATE 5 MG/ML IJ SOLN
2.0000 mg | Freq: Once | INTRAMUSCULAR | Status: AC
  Administered 2019-11-15: 2 mg via INTRAVENOUS
  Filled 2019-11-15: qty 1

## 2019-11-15 MED ORDER — KETOROLAC TROMETHAMINE 15 MG/ML IJ SOLN
15.0000 mg | Freq: Once | INTRAMUSCULAR | Status: AC
  Administered 2019-11-15: 15 mg via INTRAVENOUS
  Filled 2019-11-15: qty 1

## 2019-11-15 MED ORDER — ONDANSETRON HCL 4 MG/2ML IJ SOLN
4.0000 mg | Freq: Once | INTRAMUSCULAR | Status: AC
  Administered 2019-11-15: 4 mg via INTRAVENOUS
  Filled 2019-11-15: qty 2

## 2019-11-15 MED ORDER — ACETAMINOPHEN 500 MG PO TABS
1000.0000 mg | ORAL_TABLET | Freq: Once | ORAL | Status: AC
  Administered 2019-11-15: 1000 mg via ORAL
  Filled 2019-11-15: qty 2

## 2019-11-15 NOTE — Discharge Instructions (Addendum)
Today you received medications that may make you sleepy or impair your ability to make decisions.  For the next 24 hours please do not drive, operate heavy machinery, care for a small child with out another adult present, or perform any activities that may cause harm to you or someone else if you were to fall asleep or be impaired.   

## 2019-11-15 NOTE — ED Provider Notes (Signed)
[facility] EMERGENCY DEPARTMENT Provider Note   CSN: [id number] Arrival date & time: 11/15/19  1206     History Chief Complaint  Patient presents with  . Headache  . Emesis    Delano MetzBrittany E Fake is a 34 y.o. female with past medical history of ADHD, anxiety, depression, migraines, who presents today for evaluation of a headache.  She reports that at 930 this morning when she first woke up she felt okay and then had sudden onset of severe headache.  She states that it is across the top of her entire head.  She denies any chiropractic adjustments.  She reports that the headache started and then after that she developed nausea and vomiting.  She has vomited over 4 times since this started.  She denies any trauma.  No chiropractic adjustments.  She states that this is worse than her usual migraines and feels different.  She denies any fevers.  She is not vaccinated against Covid.  She states that she tried Goody's powders without relief.    Her head hurts more with light and sound.    She denies marijuana use or history of cyclic vomiting.  HPI     Past Medical History:  Diagnosis Date  . ADHD   . Anxiety   . Asthma   . Bronchitis   . CNS disorder   . Depression   . Drug-seeking behavior   . Fatty liver disease, nonalcoholic   . Gall stones   . Gall stones   . Genital warts   . History of kidney stones   . Hypertension   . Increased frequency of headaches   . Irritable bowel   . Kidney stones   . Major depressive disorder, recurrent severe without psychotic features (HCC)   . Migraines   . Urinary tract infection     Patient Active Problem List   Diagnosis Date Noted  . S/P laparoscopic cholecystectomy 05/21/2018  . Biliary colic 05/04/2018  . Pelvic pain in female 12/19/2016  . Condyloma acuminatum of vulva 10/22/2016  . MDD (major depressive disorder), recurrent severe, without psychosis (HCC) 11/01/2013  . 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 Borderline  behavior 05/08/2012  . ADHD (attention deficit hyperactivity disorder) 05/08/2012  . Insomnia secondary to depression with anxiety 05/08/2012    Past Surgical History:  Procedure Laterality Date  . CERVICAL BIOPSY  W/ LOOP ELECTRODE EXCISION    . CHOLECYSTECTOMY N/A 05/04/2018   Procedure: LAPAROSCOPIC CHOLECYSTECTOMY;  Surgeon: Duanne Guessannon, Jennifer, MD;  Location: [facility] ORS;  Service: General;  Laterality: N/A;  . CYSTOSCOPY/URETEROSCOPY/HOLMIUM LASER/STENT PLACEMENT Left 08/01/2017   Procedure: CYSTOSCOPY/URETEROSCOPY/HOLMIUM LASER/STENT PLACEMENT;  Surgeon: Vanna ScotlandBrandon, Ashley, MD;  Location: [facility] ORS;  Service: Urology;  Laterality: Left;  . NO PAST SURGERIES       OB History    Gravida  0   Para  0   Term  0   Preterm  0   AB  0   Living  0     SAB  0   TAB  0   Ectopic  0   Multiple  0   Live Births  0           Family History  Problem Relation Age of Onset  . Physical abuse Mother   . Anxiety disorder Mother   . ADD / ADHD Mother   . Paranoid behavior Mother   . Hypertension Mother   . Drug abuse Father   . Depression Father   . Alcohol abuse  Maternal Grandfather   . ADD / ADHD Maternal Grandmother   . Anxiety disorder Maternal Grandmother   . Dementia Maternal Grandmother   . OCD Maternal Grandmother   . Sexual abuse Maternal Grandmother   . Alcohol abuse Paternal Grandfather   . Bipolar disorder Cousin   . Schizophrenia Neg Hx   . Seizures Neg Hx     Social History   Tobacco Use  . Smoking status: Former Smoker    Packs/day: 0.25    Types: Cigarettes  . Smokeless tobacco: Never Used  Vaping Use  . Vaping Use: Some days  Substance Use Topics  . Alcohol use: No  . Drug use: No    Home Medications Prior to Admission medications   Medication Sig Start Date End Date Taking? Authorizing Provider  ALPRAZolam Prudy Feeler) 1 MG tablet Take 1 mg by mouth 3 (three) times daily as needed for anxiety.     [provider]  buPROPion (WELLBUTRIN XL)  150 MG 24 hr tablet Take 150 mg by mouth daily at 6 (six) AM.    [provider]  dicyclomine (BENTYL) 10 MG capsule Take 1 capsule (10 mg total) by mouth 3 (three) times daily as needed for up to 14 days (abdominal cramping/pain). 08/27/19 09/10/19  Phineas Semen, MD  ibuprofen (ADVIL,MOTRIN) 800 MG tablet Take 1 tablet (800 mg total) by mouth every 8 (eight) hours as needed. 05/04/18   Duanne Guess, MD  oxyCODONE (OXY IR/ROXICODONE) 5 MG immediate release tablet Take 1 tablet (5 mg total) by mouth every 6 (six) hours as needed for severe pain. 05/04/18   Duanne Guess, MD  tiZANidine (ZANAFLEX) 4 MG capsule Take 4 mg by mouth 3 (three) times daily as needed for muscle spasms.     [provider]    Allergies    Compazine [prochlorperazine], Shellfish allergy, Sulfa antibiotics, Sulfasalazine, Erythromycin, Pseudoephedrine, Cephalosporins, Ciprocin-fluocin-procin [fluocinolone acetonide], Ciprofloxacin, Flagyl [metronidazole], Hydrochlorothiazide, Keflex [cephalexin], Penicillins, and Propofol  Review of Systems   Review of Systems  Constitutional: Negative for chills and fever.  Eyes: Positive for photophobia. Negative for pain, redness and visual disturbance.  Respiratory: Negative for chest tightness and shortness of breath.   Gastrointestinal: Positive for nausea and vomiting. Negative for abdominal pain, constipation and diarrhea.  Genitourinary: Negative for dysuria.  Musculoskeletal: Positive for neck pain. Negative for back pain.  Skin: Negative for color change and rash.  Neurological: Positive for headaches. Negative for dizziness, syncope, speech difficulty, weakness and light-headedness.  Psychiatric/Behavioral: Negative for confusion. The patient is not nervous/anxious.   All other systems reviewed and are negative.   Physical Exam Updated Vital Signs BP (!) 146/99   Pulse 87   Temp 98.6 F (37 C) (Oral)   Resp 16   Ht 5\' 5"  (1.651 m)   Wt 104.3 kg    LMP  (LMP Unknown)   SpO2 99%   BMI 38.27 kg/m   Physical Exam Vitals and nursing note reviewed.  Constitutional:      Appearance: She is well-developed.     Comments: Has vomit bag with yellow vomitus in bag.   HENT:     Head: Normocephalic and atraumatic.  Eyes:     General: No visual field deficit.    Conjunctiva/sclera: Conjunctivae normal.  Cardiovascular:     Rate and Rhythm: Normal rate and regular rhythm.     Heart sounds: Normal heart sounds. No murmur heard.   Pulmonary:     Effort: Pulmonary effort is normal. No respiratory distress.  Breath sounds: Normal breath sounds.  Abdominal:     General: Bowel sounds are normal. There is no distension.     Palpations: Abdomen is soft.     Tenderness: There is no abdominal tenderness.  Musculoskeletal:     Cervical back: Neck supple.  Skin:    General: Skin is warm and dry.  Neurological:     Mental Status: She is alert and oriented to person, place, and time.     GCS: GCS eye subscore is 4. GCS verbal subscore is 5. GCS motor subscore is 6.     Cranial Nerves: No cranial nerve deficit, dysarthria or facial asymmetry.     Motor: No weakness.  Psychiatric:        Mood and Affect: Mood normal.        Behavior: Behavior normal.     ED Results / Procedures / Treatments   Labs (all labs ordered are listed, but only abnormal results are displayed) Labs Reviewed  COMPREHENSIVE METABOLIC PANEL - Abnormal; Notable for the following components:      Result Value   Glucose, Bld 115 (*)    Calcium 8.8 (*)    All other components within normal limits  CBC WITH DIFFERENTIAL/PLATELET - Abnormal; Notable for the following components:   RBC 5.26 (*)    Hemoglobin 15.7 (*)    HCT 46.4 (*)    All other components within normal limits  RAPID URINE DRUG SCREEN, HOSP PERFORMED - Abnormal; Notable for the following components:   Tetrahydrocannabinol POSITIVE (*)    All other components within normal limits  LIPASE, BLOOD    HCG, SERUM, QUALITATIVE    EKG EKG Interpretation  Date/Time:  Sunday November 15 2019 12:49:52 EDT Ventricular Rate:  82 PR Interval:    QRS Duration: 105 QT Interval:  402 QTC Calculation: 470 R Axis:   38 Text Interpretation: Sinus rhythm Confirmed by Virgina Norfolk [phone]) on 11/15/2019 1:00:28 PM   Radiology CT Head Wo Contrast  Result Date: 11/15/2019 CLINICAL DATA:  Headache since 09/30 this morning with vomiting. EXAM: CT HEAD WITHOUT CONTRAST TECHNIQUE: Contiguous axial images were obtained from the base of the skull through the vertex without intravenous contrast. COMPARISON:  02/10/2019 FINDINGS: Brain: Normal appearing cerebral hemispheres and posterior fossa structures. Normal size and position of the ventricles. No intracranial hemorrhage, mass lesion or CT evidence of acute infarction. Vascular: No hyperdense vessel or unexpected calcification. Skull: Normal. Negative for fracture or focal lesion. Sinuses/Orbits: Unremarkable. Other: None. IMPRESSION: Normal examination, unchanged. Electronically Signed   By: Beckie Salts M.D.   On: 11/15/2019 13:42    Procedures Procedures (including critical care time)  Medications Ordered in ED Medications  ondansetron (ZOFRAN) injection 4 mg (4 mg Intravenous Given 11/15/19 1242)  sodium chloride 0.9 % bolus 1,000 mL ( Intravenous Stopped 11/15/19 1457)  sodium chloride 0.9 % bolus 1,000 mL (0 mLs Intravenous Stopped 11/15/19 1352)  metoCLOPramide (REGLAN) injection 10 mg (10 mg Intravenous Given 11/15/19 1401)  diphenhydrAMINE (BENADRYL) injection 25 mg (25 mg Intravenous Given 11/15/19 1401)  acetaminophen (TYLENOL) tablet 1,000 mg (1,000 mg Oral Given 11/15/19 1522)  ketorolac (TORADOL) 15 MG/ML injection 15 mg (15 mg Intravenous Given 11/15/19 1523)  haloperidol lactate (HALDOL) injection 2 mg (2 mg Intravenous Given 11/15/19 1525)  lactated ringers bolus 1,000 mL (1,000 mLs Intravenous New Bag/Given 11/15/19 1533)    ED Course  I  have reviewed the triage vital signs and the nursing notes.  Pertinent labs & imaging results that  were available during my care of the patient were reviewed by me and considered in my medical decision making (see chart for details).  Clinical Course as of Nov 15 1614  Sun Nov 15, 2019  1600 Patient reevaluated, she says her head is now a 5 out of 10 and wishes for discharge home.  She denies any nausea.  Will p.o. challenge then discharge.   [EH]    Clinical Course User Index [EH] Norman Clay   MDM Rules/Calculators/A&P                         Patient is a 34 year old woman who presents today for evaluation of headache, nausea, and vomiting.  She denied cannabis use however then admitted that she used a delta 8 thc gummy last night.    Given that she reports headache started suddenly at 9:30 AM this morning she is within 6 hours of her headache onset, and is she reports this feels different from her normal migraine CT head was obtained without evidence of hemorrhage or other acute abnormalities.  This is within 6 hours of the onset of her headache, therefore no other imaging is indicated.  Was obtained and reviewed on my normal.  Her abdomen is soft nontender nondistended and she is neurovascularly intact.  I suspect that this is a migraine headache.  Treated with IV fluids, IV Zofran.  Her the CT had resulted, she was treated additionally with Reglan and Benadryl due to continued nausea and pain.  This improved her headache from a 10 out of 10 to an 8 out of 10.  After this she was given p.o. Tylenol, IV Toradol, and after she admitted marijuana use she was given 2 mg of IV Haldol.  EKG was checked prior to all this without significant prolonged QT interval.  She was able to p.o. challenge after this, reported that her headache felt better and requested discharge home.  Recommended avoiding marijuana products as time wise this appears related to her symptoms today.   Return  precautions were discussed with patient who states their understanding.  At the time of discharge patient denied any unaddressed complaints or concerns.  Patient is agreeable for discharge home.  Note: Portions of this report may have been transcribed using voice recognition software. Every effort was made to ensure accuracy; however, inadvertent computerized transcription errors may be present   Final Clinical Impression(s) / ED Diagnoses Final diagnoses:  Bilious vomiting with nausea  Migraine without status migrainosus, not intractable, unspecified migraine type    Rx / DC Orders ED Discharge Orders    None       Cristina Gong, PA-C 11/15/19 1847    Virgina Norfolk, DO 11/16/19 1503

## 2019-11-15 NOTE — ED Notes (Signed)
Presents with migraine HA, states worse than usual, awoke with HA, has active n/v.

## 2019-11-15 NOTE — ED Notes (Signed)
Safety measures in place, pt remains NPO until further orders rec. Significant other at bedside

## 2019-11-15 NOTE — ED Notes (Signed)
In initial assessment, no Brudzinski sign or nuchal rigidity noted.

## 2019-11-15 NOTE — ED Triage Notes (Signed)
Pt c/o nausea and vomiting onset this morning. Pt has had a total of 4 episodes of vomiting.

## 2020-02-27 ENCOUNTER — Emergency Department (HOSPITAL_BASED_OUTPATIENT_CLINIC_OR_DEPARTMENT_OTHER)
Admission: EM | Admit: 2020-02-27 | Discharge: 2020-02-27 | Disposition: A | Discharge status: Home / Self Care | Payer: Self-pay | Attending: Emergency Medicine | Admitting: Emergency Medicine

## 2020-02-27 ENCOUNTER — Other Ambulatory Visit: Payer: Self-pay

## 2020-02-27 ENCOUNTER — Emergency Department (HOSPITAL_BASED_OUTPATIENT_CLINIC_OR_DEPARTMENT_OTHER): Payer: Self-pay

## 2020-02-27 ENCOUNTER — Encounter (HOSPITAL_BASED_OUTPATIENT_CLINIC_OR_DEPARTMENT_OTHER): Payer: Self-pay | Admitting: Emergency Medicine

## 2020-02-27 DIAGNOSIS — I10 Essential (primary) hypertension: Secondary | ICD-10-CM | POA: Insufficient documentation

## 2020-02-27 DIAGNOSIS — M79672 Pain in left foot: Secondary | ICD-10-CM | POA: Insufficient documentation

## 2020-02-27 DIAGNOSIS — J45909 Unspecified asthma, uncomplicated: Secondary | ICD-10-CM | POA: Insufficient documentation

## 2020-02-27 DIAGNOSIS — Z87891 Personal history of nicotine dependence: Secondary | ICD-10-CM | POA: Insufficient documentation

## 2020-02-27 DIAGNOSIS — Z79899 Other long term (current) drug therapy: Secondary | ICD-10-CM | POA: Insufficient documentation

## 2020-02-27 DIAGNOSIS — W208XXA Other cause of strike by thrown, projected or falling object, initial encounter: Secondary | ICD-10-CM | POA: Insufficient documentation

## 2020-02-27 DIAGNOSIS — M79671 Pain in right foot: Secondary | ICD-10-CM | POA: Insufficient documentation

## 2020-02-27 NOTE — ED Triage Notes (Signed)
Pt c/o left foot heel pain x 4 months ago. Pt now c/o pain to top of left foot x 1 week.  Yesterday pt dropped a box of blinds on top of left foot causing increased pain.

## 2020-02-27 NOTE — ED Provider Notes (Signed)
[facility] EMERGENCY DEPARTMENT Provider Note   CSN: [id number] Arrival date & time: 02/27/20  0744     History Chief Complaint  Patient presents with  . Foot Injury    Carol [name] is a 34 y.o. female.  HPI      34 year old female presents with concern for foot pain and difficulty walking.  Reports 4 months of pain to the medial side of her left foot which resulted in her walking on the lateral side of her foot.  Reports over the last week she has had pain in the top of her foot, and for the last 2 to 3 weeks she has had some difficulty walking.  Describes some difficulty lifting her foot.  She also reports, however, that she has pain when she puts weight on the medial side of her foot and that her leg will give out due to pain when she does this.  Reports that she cannot walk with a normal gait due to these symptoms, and is constantly adjusting her foot to walk on the lateral side.  Reports chronic back pain due to degenerative disc disease, and some chronic urinary continence since her prior lithotripsy, but denies any new urinary retention or bowel incontinence.  Denies fevers.  Reports she has tried 7 different types of shoes.  She dropped blinds on the top of her foot yesterday on the left   Past Medical History:  Diagnosis Date  . ADHD   . Anxiety   . Asthma   . Bronchitis   . CNS disorder   . Depression   . Drug-seeking behavior   . Fatty liver disease, nonalcoholic   . Gall stones   . Gall stones   . Genital warts   . History of kidney stones   . Hypertension   . Increased frequency of headaches   . Irritable bowel   . Kidney stones   . Major depressive disorder, recurrent severe without psychotic features (HCC)   . Migraines   . Urinary tract infection     Patient Active Problem List   Diagnosis Date Noted  . S/P laparoscopic cholecystectomy 05/21/2018  . Biliary colic 05/04/2018  . Pelvic pain in female 12/19/2016  . Condyloma  acuminatum of vulva 10/22/2016  . MDD (major depressive disorder), recurrent severe, without psychosis (HCC) 11/01/2013  . 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 Borderline behavior 05/08/2012  . ADHD (attention deficit hyperactivity disorder) 05/08/2012  . Insomnia secondary to depression with anxiety 05/08/2012    Past Surgical History:  Procedure Laterality Date  . CERVICAL BIOPSY  W/ LOOP ELECTRODE EXCISION    . CHOLECYSTECTOMY N/A 05/04/2018   Procedure: LAPAROSCOPIC CHOLECYSTECTOMY;  Surgeon: Duanne Guess, MD;  Location: [facility] ORS;  Service: General;  Laterality: N/A;  . CYSTOSCOPY/URETEROSCOPY/HOLMIUM LASER/STENT PLACEMENT Left 08/01/2017   Procedure: CYSTOSCOPY/URETEROSCOPY/HOLMIUM LASER/STENT PLACEMENT;  Surgeon: Vanna Scotland, MD;  Location: [facility] ORS;  Service: Urology;  Laterality: Left;  . NO PAST SURGERIES       OB History    Gravida  0   Para  0   Term  0   Preterm  0   AB  0   Living  0     SAB  0   TAB  0   Ectopic  0   Multiple  0   Live Births  0           Family History  Problem Relation Age of Onset  . Physical abuse Mother   . Anxiety  disorder Mother   . ADD / ADHD Mother   . Paranoid behavior Mother   . Hypertension Mother   . Drug abuse Father   . Depression Father   . Alcohol abuse Maternal Grandfather   . ADD / ADHD Maternal Grandmother   . Anxiety disorder Maternal Grandmother   . Dementia Maternal Grandmother   . OCD Maternal Grandmother   . Sexual abuse Maternal Grandmother   . Alcohol abuse Paternal Grandfather   . Bipolar disorder Cousin   . Schizophrenia Neg Hx   . Seizures Neg Hx     Social History   Tobacco Use  . Smoking status: Former Smoker    Packs/day: 0.25    Types: Cigarettes  . Smokeless tobacco: Never Used  Vaping Use  . Vaping Use: Some days  Substance Use Topics  . Alcohol use: No  . Drug use: No    Home Medications Prior to Admission medications   Medication Sig Start Date End Date Taking?  Authorizing Provider  ALPRAZolam Prudy Feeler) 1 MG tablet Take 1 mg by mouth 3 (three) times daily as needed for anxiety.     [provider]  buPROPion (WELLBUTRIN XL) 150 MG 24 hr tablet Take 150 mg by mouth daily at 6 (six) AM.    [provider]  dicyclomine (BENTYL) 10 MG capsule Take 1 capsule (10 mg total) by mouth 3 (three) times daily as needed for up to 14 days (abdominal cramping/pain). 08/27/19 09/10/19  Phineas Semen, MD  ibuprofen (ADVIL,MOTRIN) 800 MG tablet Take 1 tablet (800 mg total) by mouth every 8 (eight) hours as needed. 05/04/18   Duanne Guess, MD  oxyCODONE (OXY IR/ROXICODONE) 5 MG immediate release tablet Take 1 tablet (5 mg total) by mouth every 6 (six) hours as needed for severe pain. 05/04/18   Duanne Guess, MD  tiZANidine (ZANAFLEX) 4 MG capsule Take 4 mg by mouth 3 (three) times daily as needed for muscle spasms.     [provider]    Allergies    Compazine [prochlorperazine], Shellfish allergy, Sulfa antibiotics, Sulfasalazine, Erythromycin, Pseudoephedrine, Cephalosporins, Ciprocin-fluocin-procin [fluocinolone acetonide], Ciprofloxacin, Flagyl [metronidazole], Hydrochlorothiazide, Keflex [cephalexin], Penicillins, and Propofol  Review of Systems   Review of Systems  Constitutional: Negative for fever.  Respiratory: Negative for cough and shortness of breath.   Gastrointestinal: Negative for abdominal pain, nausea and vomiting.  Genitourinary: Negative for difficulty urinating (no change).  Musculoskeletal: Positive for arthralgias and back pain.  Skin: Negative for rash and wound.  Neurological: Negative for headaches.    Physical Exam Updated Vital Signs BP (!) 178/118 (BP Location: Right Arm)   Pulse 82   Temp 98.4 F (36.9 C) (Oral)   Resp 15   Ht 5\' 6"  (1.676 m)   Wt 104.3 kg   LMP 02/13/2020   SpO2 96%   BMI 37.12 kg/m   Physical Exam Vitals and nursing note reviewed.  Constitutional:      General: She is not in  acute distress.    Appearance: Normal appearance. She is not ill-appearing, toxic-appearing or diaphoretic.  HENT:     Head: Normocephalic.  Eyes:     Conjunctiva/sclera: Conjunctivae normal.  Cardiovascular:     Rate and Rhythm: Normal rate and regular rhythm.     Pulses: Normal pulses.  Pulmonary:     Effort: Pulmonary effort is normal. No respiratory distress.  Musculoskeletal:        General: No deformity or signs of injury.     Cervical back: No  rigidity.     Comments: Normal 2+ pulses Normal strength flexion/extension knee/hip, normal plantar flexion, mild weakness with dorsiflexion on the left  Skin:    General: Skin is warm and dry.     Coloration: Skin is not jaundiced or pale.  Neurological:     General: No focal deficit present.     Mental Status: She is alert and oriented to person, place, and time.     ED Results / Procedures / Treatments   Labs (all labs ordered are listed, but only abnormal results are displayed) Labs Reviewed - No data to display  EKG None  Radiology DG Foot Complete Left  Result Date: 02/27/2020 CLINICAL DATA:  Left foot pain for 4-6 months. Box of with no blinds dropped on left foot. Pain. EXAM: LEFT FOOT - COMPLETE 3+ VIEW COMPARISON:  None. FINDINGS: There is no evidence of fracture or dislocation. There is no evidence of arthropathy or other focal bone abnormality. Soft tissues are unremarkable. IMPRESSION: Negative. Electronically Signed   By: Gerome Sam III M.D   On: 02/27/2020 08:56   DG Foot Complete Right  Result Date: 02/27/2020 CLINICAL DATA:  Toe pain for 1 week without known injury. EXAM: RIGHT FOOT COMPLETE - 3+ VIEW COMPARISON:  None. FINDINGS: There is no evidence of fracture or dislocation. There is no evidence of arthropathy or other focal bone abnormality. Soft tissues are unremarkable. IMPRESSION: Negative. Electronically Signed   By: Gerome Sam III M.D   On: 02/27/2020 08:58    Procedures Procedures  (including critical care time)  Medications Ordered in ED Medications - No data to display  ED Course  I have reviewed the triage vital signs and the nursing notes.  Pertinent labs & imaging results that were available during my care of the patient were reviewed by me and considered in my medical decision making (see chart for details).    MDM Rules/Calculators/A&P                         34 year old female presents with concern for foot pain and difficulty walking.   Has strong pulses bilaterally, no signs of acute arterial thrombus.  No signs of DVT on history or exam.  No signs of cellulitis or abscess.  No signs of septic arthritis. XR without acute abnormality.  Discussed possible plantar fasciitis given initial symptoms described but also describing difficulty walking and some difficulty with dorsiflexion noted on exam.  History is not classic for foot drop, however physical exam with weakness with dorsiflexion. No sign of proximal knee abnormalities, does have hx of back pain and DDD as possible etiology. No red flags of back pain today and this is not an acute change. No other sign of weakness on exam. Doubt significant electrolyte abnormalities.  Given post op shoe for foot pain and brace to help with foot drop and recommend outpatient follow up.      Final Clinical Impression(s) / ED Diagnoses Final diagnoses:  Bilateral foot pain    Rx / DC Orders ED Discharge Orders    None       Alvira Monday, MD 02/27/20 1528

## 2020-04-02 ENCOUNTER — Emergency Department
Admission: EM | Admit: 2020-04-02 | Discharge: 2020-04-02 | Disposition: A | Discharge status: Home / Self Care | Payer: Self-pay | Attending: Emergency Medicine | Admitting: Emergency Medicine

## 2020-04-02 ENCOUNTER — Other Ambulatory Visit: Payer: Self-pay

## 2020-04-02 ENCOUNTER — Encounter: Payer: Self-pay | Admitting: Emergency Medicine

## 2020-04-02 DIAGNOSIS — I10 Essential (primary) hypertension: Secondary | ICD-10-CM | POA: Insufficient documentation

## 2020-04-02 DIAGNOSIS — K0889 Other specified disorders of teeth and supporting structures: Secondary | ICD-10-CM | POA: Insufficient documentation

## 2020-04-02 DIAGNOSIS — K08409 Partial loss of teeth, unspecified cause, unspecified class: Secondary | ICD-10-CM

## 2020-04-02 DIAGNOSIS — Z87891 Personal history of nicotine dependence: Secondary | ICD-10-CM | POA: Insufficient documentation

## 2020-04-02 DIAGNOSIS — J45909 Unspecified asthma, uncomplicated: Secondary | ICD-10-CM | POA: Insufficient documentation

## 2020-04-02 MED ORDER — ACETAMINOPHEN 325 MG PO TABS
650.0000 mg | ORAL_TABLET | Freq: Once | ORAL | Status: AC
  Administered 2020-04-02: 650 mg via ORAL
  Filled 2020-04-02: qty 2

## 2020-04-02 MED ORDER — TRAMADOL HCL 50 MG PO TABS
50.0000 mg | ORAL_TABLET | Freq: Four times a day (QID) | ORAL | 0 refills | Status: AC | PRN
Start: 2020-04-02 — End: 2021-04-02

## 2020-04-02 MED ORDER — KETOROLAC TROMETHAMINE 60 MG/2ML IM SOLN
60.0000 mg | Freq: Once | INTRAMUSCULAR | Status: AC
  Administered 2020-04-02: 60 mg via INTRAMUSCULAR
  Filled 2020-04-02: qty 2

## 2020-04-02 MED ORDER — CLONIDINE HCL 0.1 MG PO TABS
0.2000 mg | ORAL_TABLET | Freq: Once | ORAL | Status: AC
  Administered 2020-04-02: 0.2 mg via ORAL
  Filled 2020-04-02: qty 2

## 2020-04-02 MED ORDER — LISINOPRIL 20 MG PO TABS: 20.0000 mg | ORAL_TABLET | Freq: Every day | ORAL | 11 refills | Status: DC

## 2020-04-02 MED ORDER — TRAMADOL HCL 50 MG PO TABS
50.0000 mg | ORAL_TABLET | Freq: Once | ORAL | Status: AC
  Administered 2020-04-02: 50 mg via ORAL
  Filled 2020-04-02: qty 1

## 2020-04-02 MED ORDER — AMLODIPINE BESYLATE 5 MG PO TABS
5.0000 mg | ORAL_TABLET | Freq: Every day | ORAL | 2 refills | Status: AC
Start: 2020-04-02 — End: 2021-04-02

## 2020-04-02 NOTE — ED Triage Notes (Signed)
Pt arrived via POV with c/o HA that started about 1 hour ago, pt states she took tylenol, c/o lightsenstivity. Reports hx of migraines.  Pt states she has elevated BP also with hx not currently on medications.  Pt states she had dental extraction yesterday as well.

## 2020-04-02 NOTE — ED Provider Notes (Addendum)
[facility] Emergency Department Provider Note   ____________________________________________   First MD Initiated Contact with Patient 04/02/20 1203     (approximate)  I have reviewed the triage vital signs and the nursing notes.   HISTORY  Chief Complaint Migraine    HPI Carol [name] is a 34 y.o. female patient presents with headache which started about an hour ago. Patient states yesterday she had a dental extraction and started antibiotics. Patient states she has had a history of elevated blood pressure but does not take medication for complaint. Patient  not taking medication due to lack of PCP. Patient denies vertigo or weakness. Patient states there is mild photophobia. Patient denies nausea or vomiting. Patient rates her pain as a 10/10. Patient described pain as "achy". No palliative measure for complaint.         Past Medical History:  Diagnosis Date  . ADHD   . Anxiety   . Asthma   . Bronchitis   . CNS disorder   . Depression   . Drug-seeking behavior   . Fatty liver disease, nonalcoholic   . Gall stones   . Gall stones   . Genital warts   . History of kidney stones   . Hypertension   . Increased frequency of headaches   . Irritable bowel   . Kidney stones   . Major depressive disorder, recurrent severe without psychotic features (HCC)   . Migraines   . Urinary tract infection     Patient Active Problem List   Diagnosis Date Noted  . S/P laparoscopic cholecystectomy 05/21/2018  . Biliary colic 05/04/2018  . Pelvic pain in female 12/19/2016  . Condyloma acuminatum of vulva 10/22/2016  . MDD (major depressive disorder), recurrent severe, without psychosis (HCC) 11/01/2013  . 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 Borderline behavior 05/08/2012  . ADHD (attention deficit hyperactivity disorder) 05/08/2012  . Insomnia secondary to depression with anxiety 05/08/2012    Past Surgical History:  Procedure Laterality Date  .  CERVICAL BIOPSY  W/ LOOP ELECTRODE EXCISION    . CHOLECYSTECTOMY N/A 05/04/2018   Procedure: LAPAROSCOPIC CHOLECYSTECTOMY;  Surgeon: Duanne Guess, MD;  Location: [facility] ORS;  Service: General;  Laterality: N/A;  . CYSTOSCOPY/URETEROSCOPY/HOLMIUM LASER/STENT PLACEMENT Left 08/01/2017   Procedure: CYSTOSCOPY/URETEROSCOPY/HOLMIUM LASER/STENT PLACEMENT;  Surgeon: Vanna Scotland, MD;  Location: [facility] ORS;  Service: Urology;  Laterality: Left;  . NO PAST SURGERIES      Prior to Admission medications   Medication Sig Start Date End Date Taking? Authorizing Provider  ALPRAZolam Prudy Feeler) 1 MG tablet Take 1 mg by mouth 3 (three) times daily as needed for anxiety.     [provider]  amLODipine (NORVASC) 5 MG tablet Take 1 tablet (5 mg total) by mouth daily. 04/02/20 04/02/21  Joni Reining, PA-C  buPROPion (WELLBUTRIN XL) 150 MG 24 hr tablet Take 150 mg by mouth daily at 6 (six) AM.    [provider]  dicyclomine (BENTYL) 10 MG capsule Take 1 capsule (10 mg total) by mouth 3 (three) times daily as needed for up to 14 days (abdominal cramping/pain). 08/27/19 09/10/19  Phineas Semen, MD  ibuprofen (ADVIL,MOTRIN) 800 MG tablet Take 1 tablet (800 mg total) by mouth every 8 (eight) hours as needed. 05/04/18   Duanne Guess, MD  oxyCODONE (OXY IR/ROXICODONE) 5 MG immediate release tablet Take 1 tablet (5 mg total) by mouth every 6 (six) hours as needed for severe pain. 05/04/18   Duanne Guess, MD  tiZANidine (ZANAFLEX) 4  MG capsule Take 4 mg by mouth 3 (three) times daily as needed for muscle spasms.     [provider]  traMADol (ULTRAM) 50 MG tablet Take 1 tablet (50 mg total) by mouth every 6 (six) hours as needed. 04/02/20 04/02/21  Joni Reining, PA-C    Allergies Cephalosporins, Penicillins, Prochlorperazine, Shellfish allergy, Sulfa antibiotics, Sulfasalazine, Ciprofloxacin, Erythromycin, Fluocinolone acetonide, Haloperidol, Hydrochlorothiazide, Metronidazole, Propofol,  Pseudoephedrine, Ciprocin-fluocin-procin [fluocinolone acetonide], Keflex [cephalexin], and Propofol  Family History  Problem Relation Age of Onset  . Physical abuse Mother   . Anxiety disorder Mother   . ADD / ADHD Mother   . Paranoid behavior Mother   . Hypertension Mother   . Drug abuse Father   . Depression Father   . Alcohol abuse Maternal Grandfather   . ADD / ADHD Maternal Grandmother   . Anxiety disorder Maternal Grandmother   . Dementia Maternal Grandmother   . OCD Maternal Grandmother   . Sexual abuse Maternal Grandmother   . Alcohol abuse Paternal Grandfather   . Bipolar disorder Cousin   . Schizophrenia Neg Hx   . Seizures Neg Hx     Social History Social History   Tobacco Use  . Smoking status: Former Smoker    Packs/day: 0.25    Types: Cigarettes  . Smokeless tobacco: Never Used  Vaping Use  . Vaping Use: Some days  Substance Use Topics  . Alcohol use: No  . Drug use: No    Review of Systems  Constitutional: No fever/chills Eyes: No visual changes. ENT: No sore throat. Cardiovascular: Denies chest pain. Respiratory: Denies shortness of breath. Gastrointestinal: No abdominal pain.  No nausea, no vomiting.  No diarrhea.  No constipation. Genitourinary: Negative for dysuria. Musculoskeletal: Negative for back pain. Skin: Negative for rash. Neurological: Positive for headaches, denies focal weakness or numbness. Psychiatric:  ADHD, anxiety, and depression. Endocrine:  Hypertension Hematological/Lymphatic:  Allergic/Immunilogical: See extensive medication list. ____________________________________________   PHYSICAL EXAM:  VITAL SIGNS: ED Triage Vitals [04/02/20 1145]  Enc Vitals Group     BP (!) 163/103     Pulse Rate 82     Resp 16     Temp 98.3 F (36.8 C)     Temp Source Oral     SpO2 97 %     Weight 230 lb (104.3 kg)     Height 5\' 6"  (1.676 m)     Head Circumference      Peak Flow      Pain Score 10     Pain Loc      Pain Edu?        Excl. in GC?    Constitutional: Alert and oriented. Well appearing and in no acute distress. Eyes: Conjunctivae are normal. PERRL. EOMI. Head: Atraumatic. Nose: No congestion/rhinnorhea. Mouth/Throat: Mucous membranes are moist.  Oropharynx non-erythematous. Neck: No cervical spine tenderness to palpation. Cardiovascular: Normal rate, regular rhythm. Grossly normal heart sounds.  Good peripheral circulation.  Abated blood pressure. Respiratory: Normal respiratory effort.  No retractions. Lungs CTAB. Gastrointestinal: Soft and nontender. No distention. No abdominal bruits. No CVA tenderness. Genitourinary: Deferred Musculoskeletal: No lower extremity tenderness nor edema.  No joint effusions. Neurologic:  Normal speech and language. No gross focal neurologic deficits are appreciated. No gait instability. Skin:  Skin is warm, dry and intact. No rash noted. Psychiatric: Mood and affect are normal. Speech and behavior are normal.  ____________________________________________   LABS (all labs ordered are listed, but only abnormal results are displayed)  Labs  Reviewed - No data to display ____________________________________________  EKG   ____________________________________________  RADIOLOGY I, Joni Reining, personally viewed and evaluated these images (plain radiographs) as part of my medical decision making, as well as reviewing the written report by the radiologist.  ED MD interpretation:    Official radiology report(s): No results found.  ____________________________________________   PROCEDURES  Procedure(s) performed (including Critical Care):  Procedures   ____________________________________________   INITIAL IMPRESSION / ASSESSMENT AND PLAN / ED COURSE  As part of my medical decision making, I reviewed the following data within the electronic MEDICAL RECORD NUMBER         Patient presents with dental pain, headache, elevated blood pressure.  Patient  bonded well to Catapres, Tylenol, and tramadol.  Patient advised to establish care with PCP.  Take medication as directed.   ____________________________________________   FINAL CLINICAL IMPRESSION(S) / ED DIAGNOSES  Final diagnoses:  Hypertension, unspecified type  Pain, dental  S/P tooth extraction     ED Discharge Orders         Ordered    amLODipine (NORVASC) 5 MG tablet  Daily        04/02/20 1430    traMADol (ULTRAM) 50 MG tablet  Every 6 hours PRN        04/02/20 1430          *Please note:  Carol [name] was evaluated in Emergency Department on 04/02/2020 for the symptoms described in the history of present illness. She was evaluated in the context of the global COVID-19 pandemic, which necessitated consideration that the patient might be at risk for infection with the SARS-CoV-2 virus that causes COVID-19. Institutional protocols and algorithms that pertain to the evaluation of patients at risk for COVID-19 are in a state of rapid change based on information released by regulatory bodies including the CDC and federal and state organizations. These policies and algorithms were followed during the patient's care in the ED.  Some ED evaluations and interventions may be delayed as a result of limited staffing during and the pandemic.*   Note:  This document was prepared using Dragon voice recognition software and may include unintentional dictation errors.    Joni Reining, PA-C 04/02/20 1437    Gilles Chiquito, MD 04/02/20 1438    Joni Reining, PA-C 04/02/20 1450    Gilles Chiquito, MD 04/02/20 [phone]

## 2020-04-02 NOTE — ED Notes (Signed)
Provider at bedside discussing BP medication prescription.

## 2020-05-25 ENCOUNTER — Other Ambulatory Visit: Payer: Self-pay | Admitting: Physician Assistant

## 2020-08-08 ENCOUNTER — Encounter: Payer: Self-pay | Admitting: Emergency Medicine

## 2020-08-08 ENCOUNTER — Other Ambulatory Visit: Payer: Self-pay

## 2020-08-08 ENCOUNTER — Emergency Department
Admission: EM | Admit: 2020-08-08 | Discharge: 2020-08-08 | Disposition: A | Discharge status: Home / Self Care | Payer: BC Managed Care – PPO | Attending: Student in an Organized Health Care Education/Training Program | Admitting: Student in an Organized Health Care Education/Training Program

## 2020-08-08 DIAGNOSIS — I10 Essential (primary) hypertension: Secondary | ICD-10-CM | POA: Diagnosis not present

## 2020-08-08 DIAGNOSIS — R112 Nausea with vomiting, unspecified: Secondary | ICD-10-CM | POA: Diagnosis not present

## 2020-08-08 DIAGNOSIS — Z87891 Personal history of nicotine dependence: Secondary | ICD-10-CM | POA: Diagnosis not present

## 2020-08-08 DIAGNOSIS — Z79899 Other long term (current) drug therapy: Secondary | ICD-10-CM | POA: Insufficient documentation

## 2020-08-08 DIAGNOSIS — R110 Nausea: Secondary | ICD-10-CM

## 2020-08-08 DIAGNOSIS — G8929 Other chronic pain: Secondary | ICD-10-CM | POA: Diagnosis not present

## 2020-08-08 DIAGNOSIS — M549 Dorsalgia, unspecified: Secondary | ICD-10-CM | POA: Insufficient documentation

## 2020-08-08 DIAGNOSIS — R42 Dizziness and giddiness: Secondary | ICD-10-CM | POA: Diagnosis not present

## 2020-08-08 DIAGNOSIS — J45909 Unspecified asthma, uncomplicated: Secondary | ICD-10-CM | POA: Diagnosis not present

## 2020-08-08 MED ORDER — PROMETHAZINE HCL 25 MG PO TABS
12.5000 mg | ORAL_TABLET | Freq: Once | ORAL | Status: AC
  Administered 2020-08-08: 12.5 mg via ORAL
  Filled 2020-08-08: qty 1

## 2020-08-08 NOTE — ED Triage Notes (Signed)
ARrives c/o feeling dizzy after taking MSO4 30 mg (someone elses RX) for back pain approximately 45 min PTA.  AAOx3.  Skin warm and dry. NAD

## 2020-08-08 NOTE — ED Notes (Signed)
Pt alert and oriented X 4, stable for discharge. RR even and unlabored, color WNL. Discussed discharge instructions and follow-up as directed. Discharge medications discussed if prescribed. Pt had opportunity to ask questions, and RN to provide patient/family eduction.  

## 2020-08-08 NOTE — ED Provider Notes (Signed)
[facility] Emergency Department Provider Note    Event Date/Time   First MD Initiated Contact with Patient 08/08/20 [phone]     (approximate)  I have reviewed the triage vital signs and the nursing notes.   HISTORY  Chief Complaint Dizziness    HPI Carol [name] is a 35 y.o. female with below listed past medical history presents to the ER for evaluation of  palpitation nausea and episode of vomiting 45 minutes after the patient took a morphine pill that was her friend's prescription.  Cannot member if it is 15 or 30 mg dose.  Took around 7:00 and at 740 she started feeling sick.  States that she felt lightheaded and dizzy during this episode 2.  Denies any other symptoms.  She was taking this medication before she went to work due to chronic back pain.  There is been no change in the features of her back pain.  No new numbness or tingling.  Denies any dysuria.  No flank pain.  Denies any chest pain or palpitations.  No shortness of breath.  No pleuritic pain.  No numbness or tingling.   Past Medical History:  Diagnosis Date  . ADHD   . Anxiety   . Asthma   . Bronchitis   . CNS disorder   . Depression   . Drug-seeking behavior   . Fatty liver disease, nonalcoholic   . Gall stones   . Gall stones   . Genital warts   . History of kidney stones   . Hypertension   . Increased frequency of headaches   . Irritable bowel   . Kidney stones   . Major depressive disorder, recurrent severe without psychotic features (HCC)   . Migraines   . Urinary tract infection    Family History  Problem Relation Age of Onset  . Physical abuse Mother   . Anxiety disorder Mother   . ADD / ADHD Mother   . Paranoid behavior Mother   . Hypertension Mother   . Drug abuse Father   . Depression Father   . Alcohol abuse Maternal Grandfather   . ADD / ADHD Maternal Grandmother   . Anxiety disorder Maternal Grandmother   . Dementia Maternal Grandmother   . OCD Maternal  Grandmother   . Sexual abuse Maternal Grandmother   . Alcohol abuse Paternal Grandfather   . Bipolar disorder Cousin   . Schizophrenia Neg Hx   . Seizures Neg Hx    Past Surgical History:  Procedure Laterality Date  . CERVICAL BIOPSY  W/ LOOP ELECTRODE EXCISION    . CHOLECYSTECTOMY N/A 05/04/2018   Procedure: LAPAROSCOPIC CHOLECYSTECTOMY;  Surgeon: Duanne Guess, MD;  Location: [facility] ORS;  Service: General;  Laterality: N/A;  . CYSTOSCOPY/URETEROSCOPY/HOLMIUM LASER/STENT PLACEMENT Left 08/01/2017   Procedure: CYSTOSCOPY/URETEROSCOPY/HOLMIUM LASER/STENT PLACEMENT;  Surgeon: Vanna Scotland, MD;  Location: [facility] ORS;  Service: Urology;  Laterality: Left;  . NO PAST SURGERIES     Patient Active Problem List   Diagnosis Date Noted  . S/P laparoscopic cholecystectomy 05/21/2018  . Biliary colic 05/04/2018  . Pelvic pain in female 12/19/2016  . Condyloma acuminatum of vulva 10/22/2016  . MDD (major depressive disorder), recurrent severe, without psychosis (HCC) 11/01/2013  . 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 Borderline behavior 05/08/2012  . ADHD (attention deficit hyperactivity disorder) 05/08/2012  . Insomnia secondary to depression with anxiety 05/08/2012      Prior to Admission medications   Medication Sig Start Date End Date Taking? Authorizing Provider  ALPRAZolam (XANAX) 1 MG tablet Take 1 mg by mouth 3 (three) times daily as needed for anxiety.     [provider]  amLODipine (NORVASC) 5 MG tablet Take 1 tablet (5 mg total) by mouth daily. 04/02/20 04/02/21  Joni Reining, PA-C  buPROPion (WELLBUTRIN XL) 150 MG 24 hr tablet Take 150 mg by mouth daily at 6 (six) AM.    [provider]  dicyclomine (BENTYL) 10 MG capsule Take 1 capsule (10 mg total) by mouth 3 (three) times daily as needed for up to 14 days (abdominal cramping/pain). 08/27/19 09/10/19  Phineas Semen, MD  ibuprofen (ADVIL,MOTRIN) 800 MG tablet Take 1 tablet (800 mg total) by mouth every 8 (eight) hours as  needed. 05/04/18   Duanne Guess, MD  oxyCODONE (OXY IR/ROXICODONE) 5 MG immediate release tablet Take 1 tablet (5 mg total) by mouth every 6 (six) hours as needed for severe pain. 05/04/18   Duanne Guess, MD  tiZANidine (ZANAFLEX) 4 MG capsule Take 4 mg by mouth 3 (three) times daily as needed for muscle spasms.     [provider]  traMADol (ULTRAM) 50 MG tablet Take 1 tablet (50 mg total) by mouth every 6 (six) hours as needed. 04/02/20 04/02/21  Joni Reining, PA-C  lisinopril (ZESTRIL) 20 MG tablet Take 1 tablet (20 mg total) by mouth daily. 04/02/20 04/02/20  Joni Reining, PA-C    Allergies Cephalosporins, Penicillins, Prochlorperazine, Shellfish allergy, Sulfa antibiotics, Sulfasalazine, Ciprofloxacin, Erythromycin, Fluocinolone acetonide, Haloperidol, Hydrochlorothiazide, Metronidazole, Propofol, Pseudoephedrine, Ciprocin-fluocin-procin [fluocinolone acetonide], Keflex [cephalexin], and Propofol    Social History Social History   Tobacco Use  . Smoking status: Former Smoker    Packs/day: 0.25    Types: Cigarettes  . Smokeless tobacco: Never Used  Vaping Use  . Vaping Use: Some days  Substance Use Topics  . Alcohol use: No  . Drug use: No    Review of Systems Patient denies headaches, rhinorrhea, blurry vision, numbness, shortness of breath, chest pain, edema, cough, abdominal pain, nausea, vomiting, diarrhea, dysuria, fevers, rashes or hallucinations unless otherwise stated above in HPI. ____________________________________________   PHYSICAL EXAM:  VITAL SIGNS: Vitals:   08/08/20 0841 08/08/20 0930  BP: 123/83 118/87  Pulse: (!) 109 95  Resp: 16 (!) 22  Temp: 98 F (36.7 C)   SpO2: 98% 100%    Constitutional: Alert and oriented.  Eyes: Conjunctivae are normal.  Head: Atraumatic. Nose: No congestion/rhinnorhea. Mouth/Throat: Mucous membranes are moist.   Neck: No stridor. Painless ROM.  Cardiovascular: Normal rate, regular rhythm. Grossly normal  heart sounds.  Good peripheral circulation. Respiratory: Normal respiratory effort.  No retractions. Lungs CTAB. Gastrointestinal: Soft and nontender. No distention. No abdominal bruits. No CVA tenderness. Genitourinary:  Musculoskeletal: No lower extremity tenderness nor edema.  No joint effusions. Neurologic:  Normal speech and language. No gross focal neurologic deficits are appreciated. No facial droop Skin:  Skin is warm, dry and intact. No rash noted. Psychiatric: Mood and affect are normal. Speech and behavior are normal.  ____________________________________________   LABS (all labs ordered are listed, but only abnormal results are displayed)  No results found for this or any previous visit (from the past 24 hour(s)). ____________________________________________  EKG My review and personal interpretation at Time: 9:03   Indication: dizziness  Rate: 90  Rhythm: sinus Axis: normal Other: normal intervals, no stemi ____________________________________________  RADIOLOGY  ____________________________________________   PROCEDURES  Procedure(s) performed:  Procedures    Critical Care performed: no ____________________________________________   INITIAL  IMPRESSION / ASSESSMENT AND PLAN / ED COURSE  Pertinent labs & imaging results that were available during my care of the patient were reviewed by me and considered in my medical decision making (see chart for details).   DDX: Medication effect, dysrhythmia, enteritis  SHAMBHAVI SALLEY is a 35 y.o. who presents to the ED with presentation as described above.  Patient clinically well-appearing describing symptoms occurring roughly 30 minutes after taking morphine pill.  Her history and presentation are consistent with probable side effect of this medication which she does not routinely take.  Her exam is reassuring.  She is complaining of some mild nausea but her neuro exam is reassuring as well.  Abdominal exam is soft  benign.  Will observe in the ER  Clinical Course as of 08/08/20 1116  Mon Aug 08, 2020  1112 Patient reassessed.  Feels very well at this point ambulating with no distress.  Denies any sort of symptom at this time.  Based on her presentation this is almost certainly related to the medication she took but I do believe after observation here in the ER that she is medically cleared.  Instructed patient to call for safe ride home.  Have discussed with the patient and available family all diagnostics and treatments performed thus far and all questions were answered to the best of my ability. The patient demonstrates understanding and agreement with plan.  [PR]    Clinical Course User Index [PR] Willy Eddy, MD    The patient was evaluated in Emergency Department today for the symptoms described in the history of present illness. He/she was evaluated in the context of the global COVID-19 pandemic, which necessitated consideration that the patient might be at risk for infection with the SARS-CoV-2 virus that causes COVID-19. Institutional protocols and algorithms that pertain to the evaluation of patients at risk for COVID-19 are in a state of rapid change based on information released by regulatory bodies including the CDC and federal and state organizations. These policies and algorithms were followed during the patient's care in the ED.  As part of my medical decision making, I reviewed the following data within the electronic MEDICAL RECORD NUMBER Nursing notes reviewed and incorporated, Labs reviewed, notes from prior ED visits and [place] Controlled Substance Database   ____________________________________________   FINAL CLINICAL IMPRESSION(S) / ED DIAGNOSES  Final diagnoses:  Nausea      NEW MEDICATIONS STARTED DURING THIS VISIT:  New Prescriptions   No medications on file     Note:  This document was prepared using Dragon voice recognition software and may include unintentional  dictation errors.    Willy Eddy, MD 08/08/20 1116

## 2020-12-04 ENCOUNTER — Encounter (HOSPITAL_BASED_OUTPATIENT_CLINIC_OR_DEPARTMENT_OTHER): Payer: Self-pay

## 2020-12-04 ENCOUNTER — Emergency Department (HOSPITAL_BASED_OUTPATIENT_CLINIC_OR_DEPARTMENT_OTHER)
Admission: EM | Admit: 2020-12-04 | Discharge: 2020-12-04 | Disposition: A | Discharge status: Home / Self Care | Payer: BC Managed Care – PPO | Attending: Emergency Medicine | Admitting: Emergency Medicine

## 2020-12-04 ENCOUNTER — Other Ambulatory Visit: Payer: Self-pay

## 2020-12-04 ENCOUNTER — Emergency Department (HOSPITAL_BASED_OUTPATIENT_CLINIC_OR_DEPARTMENT_OTHER): Payer: BC Managed Care – PPO

## 2020-12-04 DIAGNOSIS — I10 Essential (primary) hypertension: Secondary | ICD-10-CM | POA: Diagnosis not present

## 2020-12-04 DIAGNOSIS — J069 Acute upper respiratory infection, unspecified: Secondary | ICD-10-CM | POA: Insufficient documentation

## 2020-12-04 DIAGNOSIS — Z87891 Personal history of nicotine dependence: Secondary | ICD-10-CM | POA: Diagnosis not present

## 2020-12-04 DIAGNOSIS — J45909 Unspecified asthma, uncomplicated: Secondary | ICD-10-CM | POA: Diagnosis not present

## 2020-12-04 DIAGNOSIS — R059 Cough, unspecified: Secondary | ICD-10-CM | POA: Diagnosis present

## 2020-12-04 MED ORDER — LIDOCAINE VISCOUS HCL 2 % MT SOLN
15.0000 mL | Freq: Once | OROMUCOSAL | Status: DC
  Filled 2020-12-04: qty 15

## 2020-12-04 MED ORDER — BENZONATATE 100 MG PO CAPS: 100.0000 mg | ORAL_CAPSULE | Freq: Three times a day (TID) | ORAL | 0 refills | Status: AC

## 2020-12-04 MED ORDER — BENZONATATE 100 MG PO CAPS: 100.0000 mg | ORAL_CAPSULE | Freq: Three times a day (TID) | ORAL | 0 refills | Status: DC

## 2020-12-04 MED ORDER — LIDOCAINE VISCOUS HCL 2 % MT SOLN: 15.0000 mL | OROMUCOSAL | 0 refills | Status: DC | PRN

## 2020-12-04 MED ORDER — LIDOCAINE VISCOUS HCL 2 % MT SOLN: 15.0000 mL | OROMUCOSAL | 0 refills | Status: AC | PRN

## 2020-12-04 MED ORDER — ONDANSETRON 4 MG PO TBDP: 4.0000 mg | ORAL_TABLET | Freq: Three times a day (TID) | ORAL | 0 refills | Status: DC | PRN

## 2020-12-04 MED ORDER — ONDANSETRON HCL 4 MG PO TABS: 4.0000 mg | ORAL_TABLET | Freq: Four times a day (QID) | ORAL | 0 refills | Status: DC

## 2020-12-04 NOTE — ED Provider Notes (Signed)
[facility] EMERGENCY DEPARTMENT Provider Note   CSN: [id number] Arrival date & time: 12/04/20  1139     History Chief Complaint  Patient presents with   Cough    Carol [name] is a 35 y.o. female.  HPI  Patient presents with cough, congestion, sore throat x4 days.  She tested negative for COVID, she is not vaccinated.  She has tried Mucinex without relief.  She had a fever of 101 for 2 days, but that broke.  She has been coughing up green sputum which is slowly becoming less green and more yellow.  Seen at an e-visit and diagnosed with bronchitis. Prescribed a Z-Pak and prednisone.  She is finished both and states she is not feeling much improved.  She is not having any diarrhea, vomiting, chest pain, shortness of breath.   Past Medical History:  Diagnosis Date   ADHD    Anxiety    Asthma    Bronchitis    CNS disorder    Depression    Drug-seeking behavior    Fatty liver disease, nonalcoholic    Gall stones    Gall stones    Genital warts    History of kidney stones    Hypertension    Increased frequency of headaches    Irritable bowel    Kidney stones    Major depressive disorder, recurrent severe without psychotic features (HCC)    Migraines    Urinary tract infection     Patient Active Problem List   Diagnosis Date Noted   S/P laparoscopic cholecystectomy 05/21/2018   Biliary colic 05/04/2018   Pelvic pain in female 12/19/2016   Condyloma acuminatum of vulva 10/22/2016   MDD (major depressive disorder), recurrent severe, without psychosis (HCC) 11/01/2013   CNS disorder 07/30/2012   Borderline behavior 05/08/2012   ADHD (attention deficit hyperactivity disorder) 05/08/2012   Insomnia secondary to depression with anxiety 05/08/2012    Past Surgical History:  Procedure Laterality Date   CERVICAL BIOPSY  W/ LOOP ELECTRODE EXCISION     CHOLECYSTECTOMY N/A 05/04/2018   Procedure: LAPAROSCOPIC CHOLECYSTECTOMY;  Surgeon: Duanne Guess, MD;   Location: [facility] ORS;  Service: General;  Laterality: N/A;   CYSTOSCOPY/URETEROSCOPY/HOLMIUM LASER/STENT PLACEMENT Left 08/01/2017   Procedure: CYSTOSCOPY/URETEROSCOPY/HOLMIUM LASER/STENT PLACEMENT;  Surgeon: Vanna Scotland, MD;  Location: [facility] OR[facility];  Service: Urology;  Laterality: Left;   NO PAST SURGERIES       OB History     Gravida  0   Para  0   Term  0   Preterm  0   AB  0   Living  0      SAB  0   IAB  0   Ectopic  0   Multiple  0   Live Births  0           Family History  Problem Relation Age of Onset   Physical abuse Mother    Anxiety disorder Mother    ADD / ADHD Mother    Paranoid behavior Mother    Hypertension Mother    Drug abuse Father    Depression Father    Alcohol abuse Maternal Grandfather    ADD / ADHD Maternal Grandmother    Anxiety disorder Maternal Grandmother    Dementia Maternal Grandmother    OCD Maternal Grandmother    Sexual abuse Maternal Grandmother    Alcohol abuse Paternal Grandfather    Bipolar disorder Cousin    Schizophrenia Neg Hx    Seizures Neg  Hx     Social History   Tobacco Use   Smoking status: Former    Packs/day: 0.25    Types: Cigarettes   Smokeless tobacco: Never  Vaping Use   Vaping Use: Some days  Substance Use Topics   Alcohol use: No   Drug use: No    Home Medications Prior to Admission medications   Medication Sig Start Date End Date Taking? Authorizing Provider  ALPRAZolam Prudy Feeler(XANAX) 1 MG tablet Take 1 mg by mouth 3 (three) times daily as needed for anxiety.     [provider]  amLODipine (NORVASC) 5 MG tablet Take 1 tablet (5 mg total) by mouth daily. 04/02/20 04/02/21  Joni ReiningSmith, Ronald K, PA-C  buPROPion (WELLBUTRIN XL) 150 MG 24 hr tablet Take 150 mg by mouth daily at 6 (six) AM.    [provider]  dicyclomine (BENTYL) 10 MG capsule Take 1 capsule (10 mg total) by mouth 3 (three) times daily as needed for up to 14 days (abdominal cramping/pain). 08/27/19 09/10/19  Phineas SemenGoodman,  Graydon, MD  ibuprofen (ADVIL,MOTRIN) 800 MG tablet Take 1 tablet (800 mg total) by mouth every 8 (eight) hours as needed. 05/04/18   Duanne Guessannon, Jennifer, MD  oxyCODONE (OXY IR/ROXICODONE) 5 MG immediate release tablet Take 1 tablet (5 mg total) by mouth every 6 (six) hours as needed for severe pain. 05/04/18   Duanne Guessannon, Jennifer, MD  tiZANidine (ZANAFLEX) 4 MG capsule Take 4 mg by mouth 3 (three) times daily as needed for muscle spasms.     [provider]  traMADol (ULTRAM) 50 MG tablet Take 1 tablet (50 mg total) by mouth every 6 (six) hours as needed. 04/02/20 04/02/21  Joni ReiningSmith, Ronald K, PA-C  lisinopril (ZESTRIL) 20 MG tablet Take 1 tablet (20 mg total) by mouth daily. 04/02/20 04/02/20  Joni ReiningSmith, Ronald K, PA-C    Allergies    Cephalosporins, Penicillins, Prochlorperazine, Shellfish allergy, Sulfa antibiotics, Sulfasalazine, Ciprofloxacin, Erythromycin, Fluocinolone acetonide, Haloperidol, Hydrochlorothiazide, Metronidazole, Propofol, Pseudoephedrine, Ciprocin-fluocin-procin [fluocinolone acetonide], Keflex [cephalexin], and Propofol  Review of Systems   Review of Systems  Constitutional:  Positive for appetite change and fever.  HENT:  Positive for congestion and sore throat.   Respiratory:  Positive for cough. Negative for chest tightness and shortness of breath.   Cardiovascular:  Negative for chest pain and leg swelling.  Gastrointestinal:  Positive for nausea. Negative for constipation and vomiting.  Musculoskeletal:  Positive for myalgias.  Neurological:  Positive for headaches.   Physical Exam Updated Vital Signs BP 125/90   Pulse 69   Temp 97.7 F (36.5 C) (Oral)   Resp 17   Ht 5\' 6"  (1.676 m)   Wt 108 kg   LMP 11/19/2020   SpO2 99%   BMI 38.41 kg/m   Physical Exam Vitals and nursing note reviewed. Exam conducted with a chaperone present.  Constitutional:      General: She is not in acute distress.    Appearance: Normal appearance. She is obese.  HENT:     Head:  Normocephalic and atraumatic.     Nose: Congestion present.     Mouth/Throat:     Pharynx: Posterior oropharyngeal erythema present. No oropharyngeal exudate.  Eyes:     General: No scleral icterus.    Extraocular Movements: Extraocular movements intact.     Pupils: Pupils are equal, round, and reactive to light.  Cardiovascular:     Rate and Rhythm: Normal rate and regular rhythm.  Pulmonary:     Effort: Pulmonary effort is  normal.     Breath sounds: Normal breath sounds.     Comments: Lungs are clear to auscultation bilaterally.  No wheezes, no respiratory distress or accessory muscle use.  Breathing comfortably on room air and satting at 98%. Abdominal:     Tenderness: There is no abdominal tenderness.  Skin:    Coloration: Skin is not jaundiced.  Neurological:     Mental Status: She is alert. Mental status is at baseline.     Coordination: Coordination normal.    ED Results / Procedures / Treatments   Labs (all labs ordered are listed, but only abnormal results are displayed) Labs Reviewed - No data to display  EKG EKG Interpretation  Date/Time:  Sunday December 04 2020 12:48:11 EDT Ventricular Rate:  63 PR Interval:  145 QRS Duration: 105 QT Interval:  393 QTC Calculation: 403 R Axis:   42 Text Interpretation: Sinus rhythm since last tracing no significant change Confirmed by Rolan Bucco [phone] on 12/04/2020 1:36:24 PM  Radiology DG Chest Portable 1 View  Result Date: 12/04/2020 CLINICAL DATA:  Shortness of breath and cough. EXAM: PORTABLE CHEST 1 VIEW COMPARISON:  09/02/2019 FINDINGS: The cardiac silhouette, mediastinal and hilar contours are normal. The lungs are clear. No pleural effusions. The bony thorax is intact. IMPRESSION: No acute cardiopulmonary findings. Electronically Signed   By: Rudie Meyer M.D.   On: 12/04/2020 13:18    Procedures Procedures   Medications Ordered in ED Medications - No data to display  ED Course  I have reviewed the triage  vital signs and the nursing notes.  Pertinent labs & imaging results that were available during my care of the patient were reviewed by me and considered in my medical decision making (see chart for details).    MDM Rules/Calculators/A&P                           Patient has stable vitals, nontoxic-appearing.  Resting comfortably, no wheezing or adventitious lung sounds.  No respiratory distress and satting appropriately on room air.  Chest x-ray ordered to evaluate for pneumonia, that was negative.    Suspect this is a viral URI that needs to run its course.  It has not been going on for a few days, do not suspect this is a bacterial bronchitis.  Additionally, she already was given a Z-Pak that did not improve her symptoms.  This is more evidence that this is a viral URI and not a bacterial infection.  She has a history of asthma, although she states she has not used her inhaler in many years and has not had any exacerbation or wheezing.  Thus, I doubt this is an exacerbation especially given she is having green sputum.    30 minutes were spent educating the patient on viral infections, potential complications and risks, the differential, and symptomatic management.  We discussed possible signs of emergency and why I do not think she is suffering from acute respiratory distress or any life-threatening illnesses.  Discussed that antibiotics are not going to help improve her symptoms, also discussed I do not think labs would be beneficial in her work-up.  Patient was worried that her oxygen level was 96% at home on pulse ox, I discussed with her that that is not hypoxic and not a reason to be concerned.  She is not having any respiratory distress, I advised her to use a pulse ox sparingly and to leave it on for multiple minutes  to make sure it starts appropriately.  Also discussed that unless the oxygen dropped below 88, there is no indication to be on oxygen.    Discussed return precautions and  appropriate follow-up.  Advised patient this will likely take over a week to improve, discussed the course of URI as well as what to expect.  Discussed that having some symptoms despite medication is appropriate and not a sign of worsening disease.  Told her that all these medications help reduce symptoms, they will not alleviate the symptoms for the virus runs its course.  Discussed reasons to return back to the ER.  Patient verbalized understanding agreement with the plan.   Final Clinical Impression(s) / ED Diagnoses Final diagnoses:  None    Rx / DC Orders ED Discharge Orders     None        Theron Arista, PA-C 12/04/20 1950    Rolan Bucco, MD 12/05/20 1102

## 2020-12-04 NOTE — ED Triage Notes (Signed)
Pt arrives with c/o continued cough states that she was diagnosed with bronchitis 4 days ago states that she is on day 4 of prednisone and has been coughing up green mucus. States she has had multiple negative covid test, was seen by E-visit.

## 2020-12-04 NOTE — Discharge Instructions (Addendum)
Continue take Tylenol and ibuprofen as needed for body aches and fever. You can take Zofran every 6 hours as needed for nausea.  Make sure you drink fluids and stay hydrated, if you do not like eating you can drink Ensure. You can gargle lidocaine solution as needed for sore throat.  Do not swallow spit it out. If needed, you can take Tessalon Perles up to every 8 hours for cough.  This will produce the urge to cough and hopefully make you feel somewhat improved.

## 2021-01-18 ENCOUNTER — Encounter: Payer: Self-pay | Admitting: General Surgery

## 2021-01-28 ENCOUNTER — Emergency Department (HOSPITAL_BASED_OUTPATIENT_CLINIC_OR_DEPARTMENT_OTHER)
Admission: EM | Admit: 2021-01-28 | Discharge: 2021-01-28 | Disposition: A | Discharge status: Home / Self Care | Payer: BC Managed Care – PPO | Attending: Emergency Medicine | Admitting: Emergency Medicine

## 2021-01-28 ENCOUNTER — Emergency Department (HOSPITAL_BASED_OUTPATIENT_CLINIC_OR_DEPARTMENT_OTHER): Payer: BC Managed Care – PPO

## 2021-01-28 ENCOUNTER — Encounter (HOSPITAL_BASED_OUTPATIENT_CLINIC_OR_DEPARTMENT_OTHER): Payer: Self-pay | Admitting: Emergency Medicine

## 2021-01-28 ENCOUNTER — Other Ambulatory Visit: Payer: Self-pay

## 2021-01-28 DIAGNOSIS — U071 COVID-19: Secondary | ICD-10-CM | POA: Diagnosis not present

## 2021-01-28 DIAGNOSIS — I10 Essential (primary) hypertension: Secondary | ICD-10-CM | POA: Insufficient documentation

## 2021-01-28 DIAGNOSIS — R7989 Other specified abnormal findings of blood chemistry: Secondary | ICD-10-CM | POA: Diagnosis not present

## 2021-01-28 DIAGNOSIS — R519 Headache, unspecified: Secondary | ICD-10-CM | POA: Diagnosis present

## 2021-01-28 DIAGNOSIS — Z79899 Other long term (current) drug therapy: Secondary | ICD-10-CM | POA: Diagnosis not present

## 2021-01-28 DIAGNOSIS — J45909 Unspecified asthma, uncomplicated: Secondary | ICD-10-CM | POA: Insufficient documentation

## 2021-01-28 DIAGNOSIS — R112 Nausea with vomiting, unspecified: Secondary | ICD-10-CM | POA: Diagnosis not present

## 2021-01-28 DIAGNOSIS — R6889 Other general symptoms and signs: Secondary | ICD-10-CM

## 2021-01-28 DIAGNOSIS — Z87891 Personal history of nicotine dependence: Secondary | ICD-10-CM | POA: Insufficient documentation

## 2021-01-28 LAB — CBC WITH DIFFERENTIAL/PLATELET
Abs Immature Granulocytes: 0.01 10*3/uL (ref 0.00–0.07)
Basophils Absolute: 0 10*3/uL (ref 0.0–0.1)
Basophils Relative: 1 %
Eosinophils Absolute: 0 10*3/uL (ref 0.0–0.5)
Eosinophils Relative: 0 %
HCT: 41.7 % (ref 36.0–46.0)
Hemoglobin: 14.2 g/dL (ref 12.0–15.0)
Immature Granulocytes: 0 %
Lymphocytes Relative: 36 %
Lymphs Abs: 1.4 10*3/uL (ref 0.7–4.0)
MCH: 30.3 pg (ref 26.0–34.0)
MCHC: 34.1 g/dL (ref 30.0–36.0)
MCV: 88.9 fL (ref 80.0–100.0)
Monocytes Absolute: 0.9 10*3/uL (ref 0.1–1.0)
Monocytes Relative: 22 %
Neutro Abs: 1.7 10*3/uL (ref 1.7–7.7)
Neutrophils Relative %: 41 %
Platelets: 173 10*3/uL (ref 150–400)
RBC: 4.69 MIL/uL (ref 3.87–5.11)
RDW: 12.8 % (ref 11.5–15.5)
WBC: 4.1 10*3/uL (ref 4.0–10.5)
nRBC: 0 % (ref 0.0–0.2)

## 2021-01-28 LAB — URINALYSIS, ROUTINE W REFLEX MICROSCOPIC
Bilirubin Urine: NEGATIVE
Glucose, UA: NEGATIVE mg/dL
Hgb urine dipstick: NEGATIVE
Ketones, ur: >=80 mg/dL — AB
Leukocytes,Ua: NEGATIVE
Nitrite: NEGATIVE
Protein, ur: NEGATIVE mg/dL
Specific Gravity, Urine: 1.03 (ref 1.005–1.030)
pH: 5.5 (ref 5.0–8.0)

## 2021-01-28 LAB — COMPREHENSIVE METABOLIC PANEL
ALT: 91 U/L — ABNORMAL HIGH (ref 0–44)
AST: 86 U/L — ABNORMAL HIGH (ref 15–41)
Albumin: 3.2 g/dL — ABNORMAL LOW (ref 3.5–5.0)
Alkaline Phosphatase: 48 U/L (ref 38–126)
Anion gap: 8 (ref 5–15)
BUN: 10 mg/dL (ref 6–20)
CO2: 18 mmol/L — ABNORMAL LOW (ref 22–32)
Calcium: 7.2 mg/dL — ABNORMAL LOW (ref 8.9–10.3)
Chloride: 109 mmol/L (ref 98–111)
Creatinine, Ser: 0.78 mg/dL (ref 0.44–1.00)
GFR, Estimated: >60 mL/min (ref >60)
Glucose, Bld: 78 mg/dL (ref 70–99)
Potassium: 4.7 mmol/L (ref 3.5–5.1)
Sodium: 135 mmol/L (ref 135–145)
Total Bilirubin: 0.9 mg/dL (ref 0.3–1.2)
Total Protein: 6.3 g/dL — ABNORMAL LOW (ref 6.5–8.1)

## 2021-01-28 LAB — PREGNANCY, URINE: Preg Test, Ur: NEGATIVE

## 2021-01-28 MED ORDER — METOCLOPRAMIDE HCL 5 MG/ML IJ SOLN
INTRAMUSCULAR | Status: AC
  Administered 2021-01-28: 10 mg via INTRAVENOUS
  Filled 2021-01-28: qty 2

## 2021-01-28 MED ORDER — METOCLOPRAMIDE HCL 5 MG/ML IJ SOLN
10.0000 mg | Freq: Once | INTRAMUSCULAR | Status: AC
  Administered 2021-01-28: 10 mg via INTRAVENOUS
  Filled 2021-01-28: qty 2

## 2021-01-28 MED ORDER — SODIUM CHLORIDE 0.9 % IV BOLUS
500.0000 mL | Freq: Once | INTRAVENOUS | Status: AC
  Administered 2021-01-28: 500 mL via INTRAVENOUS

## 2021-01-28 MED ORDER — ONDANSETRON 4 MG PO TBDP: 4.0000 mg | ORAL_TABLET | Freq: Three times a day (TID) | ORAL | 0 refills | Status: DC | PRN

## 2021-01-28 MED ORDER — DIPHENHYDRAMINE HCL 50 MG/ML IJ SOLN
50.0000 mg | Freq: Once | INTRAMUSCULAR | Status: AC
  Administered 2021-01-28: 50 mg via INTRAVENOUS
  Filled 2021-01-28: qty 1

## 2021-01-28 MED ORDER — DIPHENHYDRAMINE HCL 50 MG/ML IJ SOLN
INTRAMUSCULAR | Status: AC
  Administered 2021-01-28: 50 mg via INTRAVENOUS
  Filled 2021-01-28: qty 1

## 2021-01-28 MED ORDER — SODIUM CHLORIDE 0.9 % IV BOLUS
1000.0000 mL | Freq: Once | INTRAVENOUS | Status: AC
  Administered 2021-01-28: 1000 mL via INTRAVENOUS

## 2021-01-28 MED ORDER — ONDANSETRON 4 MG PO TBDP: 4.0000 mg | ORAL_TABLET | Freq: Three times a day (TID) | ORAL | 0 refills | Status: AC | PRN

## 2021-01-28 NOTE — Discharge Instructions (Addendum)
Take Tylenol Motrin as needed for body ache and fever. Take Zofran as needed for nausea and vomiting, continue to stay plenty hydrated and drink plenty of fluids.  This will likely take up to 2 weeks to fully resolve if it is viral, recommend treating symptoms as they present.  If things change or worsen return back to the ED as needed.

## 2021-01-28 NOTE — ED Provider Notes (Signed)
[facility] EMERGENCY DEPARTMENT Provider Note   CSN: [id number] Arrival date & time: 01/28/21  1302     History Chief Complaint  Patient presents with  . Generalized Body Aches  . Emesis    Carol [name] is a 35 y.o. female.  HPI  Patient presents with headache, nausea, multiple episodes of emesis.  Her symptoms started Thursday night, the pain constant the last 2 days.  Patient reports has been having fevers at home of 101.9 orally.  She has been taking Tylenol which is helped somewhat with the symptoms.  Reports that she is to have headaches before being on blood pressure medications and, states this headache is different because it is worse.  It feels band-like distribution across her forehead, is constant and aggravated by lights.  She is also having generalized body aches.  Reports she is also having pain in the back of her neck.  Past Medical History:  Diagnosis Date  . ADHD   . Anxiety   . Asthma   . Bronchitis   . CNS disorder   . Depression   . Drug-seeking behavior   . Fatty liver disease, nonalcoholic   . Gall stones   . Gall stones   . Genital warts   . History of kidney stones   . Hypertension   . Increased frequency of headaches   . Irritable bowel   . Kidney stones   . Major depressive disorder, recurrent severe without psychotic features (HCC)   . Migraines   . Urinary tract infection     Patient Active Problem List   Diagnosis Date Noted  . S/P laparoscopic cholecystectomy 05/21/2018  . Biliary colic 05/04/2018  . Pelvic pain in female 12/19/2016  . Condyloma acuminatum of vulva 10/22/2016  . MDD (major depressive disorder), recurrent severe, without psychosis (HCC) 11/01/2013  . 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 Borderline behavior 05/08/2012  . ADHD (attention deficit hyperactivity disorder) 05/08/2012  . Insomnia secondary to depression with anxiety 05/08/2012    Past Surgical History:  Procedure Laterality Date  . CERVICAL  BIOPSY  W/ LOOP ELECTRODE EXCISION    . CHOLECYSTECTOMY N/A 05/04/2018   Procedure: LAPAROSCOPIC CHOLECYSTECTOMY;  Surgeon: Duanne Guess, MD;  Location: [facility] ORS;  Service: General;  Laterality: N/A;  . CYSTOSCOPY/URETEROSCOPY/HOLMIUM LASER/STENT PLACEMENT Left 08/01/2017   Procedure: CYSTOSCOPY/URETEROSCOPY/HOLMIUM LASER/STENT PLACEMENT;  Surgeon: Vanna Scotland, MD;  Location: [facility] ORS;  Service: Urology;  Laterality: Left;  . NO PAST SURGERIES       OB History     Gravida  0   Para  0   Term  0   Preterm  0   AB  0   Living  0      SAB  0   IAB  0   Ectopic  0   Multiple  0   Live Births  0           Family History  Problem Relation Age of Onset  . Physical abuse Mother   . Anxiety disorder Mother   . ADD / ADHD Mother   . Paranoid behavior Mother   . Hypertension Mother   . Drug abuse Father   . Depression Father   . Alcohol abuse Maternal Grandfather   . ADD / ADHD Maternal Grandmother   . Anxiety disorder Maternal Grandmother   . Dementia Maternal Grandmother   . OCD Maternal Grandmother   . Sexual abuse Maternal Grandmother   . Alcohol abuse Paternal Grandfather   .  Bipolar disorder Cousin   . Schizophrenia Neg Hx   . Seizures Neg Hx     Social History   Tobacco Use  . Smoking status: Former    Packs/day: 0.25    Types: Cigarettes  . Smokeless tobacco: Never  Vaping Use  . Vaping Use: Some days  Substance Use Topics  . Alcohol use: No  . Drug use: No    Home Medications Prior to Admission medications   Medication Sig Start Date End Date Taking? Authorizing Provider  ALPRAZolam Prudy Feeler) 1 MG tablet Take 1 mg by mouth 3 (three) times daily as needed for anxiety.     [provider]  amLODipine (NORVASC) 5 MG tablet Take 1 tablet (5 mg total) by mouth daily. 04/02/20 04/02/21  Joni Reining, PA-C  benzonatate (TESSALON) 100 MG capsule Take 1 capsule (100 mg total) by mouth every 8 (eight) hours. 12/04/20   Theron Arista, PA-C   buPROPion (WELLBUTRIN XL) 150 MG 24 hr tablet Take 150 mg by mouth daily at 6 (six) AM.    [provider]  dicyclomine (BENTYL) 10 MG capsule Take 1 capsule (10 mg total) by mouth 3 (three) times daily as needed for up to 14 days (abdominal cramping/pain). 08/27/19 09/10/19  Phineas Semen, MD  ibuprofen (ADVIL,MOTRIN) 800 MG tablet Take 1 tablet (800 mg total) by mouth every 8 (eight) hours as needed. 05/04/18   Duanne Guess, MD  lidocaine (XYLOCAINE) 2 % solution Use as directed 15 mLs in the mouth or throat as needed for mouth pain. 12/04/20   Theron Arista, PA-C  ondansetron (ZOFRAN ODT) 4 MG disintegrating tablet Take 1 tablet (4 mg total) by mouth every 8 (eight) hours as needed for nausea or vomiting. 12/04/20   Theron Arista, PA-C  oxyCODONE (OXY IR/ROXICODONE) 5 MG immediate release tablet Take 1 tablet (5 mg total) by mouth every 6 (six) hours as needed for severe pain. 05/04/18   Duanne Guess, MD  tiZANidine (ZANAFLEX) 4 MG capsule Take 4 mg by mouth 3 (three) times daily as needed for muscle spasms.     [provider]  traMADol (ULTRAM) 50 MG tablet Take 1 tablet (50 mg total) by mouth every 6 (six) hours as needed. 04/02/20 04/02/21  Joni Reining, PA-C  lisinopril (ZESTRIL) 20 MG tablet Take 1 tablet (20 mg total) by mouth daily. 04/02/20 04/02/20  Joni Reining, PA-C    Allergies    Cephalosporins, Penicillins, Prochlorperazine, Shellfish allergy, Sulfa antibiotics, Sulfasalazine, Ciprofloxacin, Erythromycin, Fluocinolone acetonide, Haloperidol, Hydrochlorothiazide, Metronidazole, Propofol, Pseudoephedrine, Ciprocin-fluocin-procin [fluocinolone acetonide], Keflex [cephalexin], and Propofol  Review of Systems   Review of Systems  Constitutional:  Positive for fever.  Eyes:  Positive for photophobia.  Respiratory:  Negative for shortness of breath.   Cardiovascular:  Negative for chest pain.  Gastrointestinal:  Positive for nausea and vomiting. Negative for  abdominal pain.  Genitourinary:  Negative for dysuria.  Musculoskeletal:  Positive for myalgias and neck pain. Negative for gait problem.  Neurological:  Positive for headaches.   Physical Exam Updated Vital Signs BP 105/84   Pulse 79   Temp 98.4 F (36.9 C) (Oral)   Resp 18   Ht 5\' 6"  (1.676 m)   Wt 101.2 kg   LMP 01/11/2021   SpO2 99%   BMI 35.99 kg/m   Physical Exam Vitals and nursing note reviewed. Exam conducted with a chaperone present.  Constitutional:      Appearance: Normal appearance.  HENT:     Head:  Normocephalic and atraumatic.  Eyes:     General: No scleral icterus.       Right eye: No discharge.        Left eye: No discharge.     Extraocular Movements: Extraocular movements intact.     Pupils: Pupils are equal, round, and reactive to light.     Comments: No nystagmus.  Neck:     Comments: No mengismus  Cardiovascular:     Rate and Rhythm: Normal rate and regular rhythm.     Pulses: Normal pulses.     Heart sounds: Normal heart sounds. No murmur heard.   No friction rub. No gallop.  Pulmonary:     Effort: Pulmonary effort is normal. No respiratory distress.     Breath sounds: Normal breath sounds.  Abdominal:     General: Abdomen is flat. Bowel sounds are normal. There is no distension.     Palpations: Abdomen is soft.     Tenderness: There is no abdominal tenderness.  Skin:    General: Skin is warm and dry.     Coloration: Skin is not jaundiced.  Neurological:     Mental Status: She is alert. Mental status is at baseline.     Coordination: Coordination normal.     Comments: Cranial nerves III through XII grossly intact.  Sensation to light touch is grossly intact to the upper and lower extremities.  Grip strength is equal bilaterally.   ED Results / Procedures / Treatments   Labs (all labs ordered are listed, but only abnormal results are displayed) Labs Reviewed - No data to display  EKG None  Radiology No results  found.  Procedures Procedures   Medications Ordered in ED Medications - No data to display  ED Course  I have reviewed the triage vital signs and the nursing notes.  Pertinent labs & imaging results that were available during my care of the patient were reviewed by me and considered in my medical decision making (see chart for details).  Clinical Course as of 01/28/21 1757  Sat Jan 28, 2021  1545 Urinalysis, Routine w reflex microscopic Urine, Clean Catch(!) No UTI [HS]  1545 Pregnancy, urine Not ectopic [HS]    Clinical Course User Index [HS] Theron Arista, PA-C   MDM Rules/Calculators/A&P                           Patient is stable vitals, afebrile on exam.  No nystagmus, no meningismus.  She is resting comfortably, does report classic headache symptoms but given this feels different than normal think it is reasonable to go ahead with CT. also check electrolytes to evaluate for derangement given her episodes of vomiting.  Will check CBC possible white count.  Sounds are clear to auscultation bilaterally, doubt pneumonia.  She is having generalized body aches, will test for COVID and flu.  Patient does have mild elevation LFTs, patient states she had her blood work checked 2 months ago by her psychiatric office and but is aware that they are mildly elevated.  No right upper quadrant tenderness that be concerning for cholecystitis cholelithiasis or hepatitis.  She reports improvement of her headache after fluids and medicine on reexamination.  Did not see any signs for acute pathology at this time, I think she is appropriate for outpatient follow-up and discharge.  Suspect she likely has a viral illness vs migraine.   Final Clinical Impression(s) / ED Diagnoses Final diagnoses:  None  Rx / DC Orders ED Discharge Orders     None        Theron Arista, [place] 01/28/21 1757    Gloris Manchester, MD 01/29/21 [phone]

## 2021-01-28 NOTE — ED Triage Notes (Signed)
Pt reports she started vomiting on Thurs; now has body aches, HA, chills; negative Covid on Fri

## 2021-01-28 NOTE — ED Notes (Signed)
Pt NAD, a/ox4. Pt verbalizes understanding of all DC and f/u instructions. All questions answered. Pt walks with steady gait to lobby at DC with significant other.   

## 2021-01-28 NOTE — ED Notes (Signed)
Patient transported to CT 

## 2021-01-28 NOTE — ED Notes (Signed)
Pt wearing sunglasses in bed, a/ox4. Pt states she has had a fever, vomiting, headache and bodyaches since Thursday. Pt states she ate questionable chicken Thursday night. Pt denies ABD pain. +dark urine but denies burning or dysuria.

## 2021-01-29 LAB — SARS CORONAVIRUS 2 (TAT 6-24 HRS): SARS Coronavirus 2: POSITIVE — AB

## 2021-02-08 ENCOUNTER — Other Ambulatory Visit: Payer: Self-pay

## 2021-02-08 ENCOUNTER — Emergency Department (HOSPITAL_BASED_OUTPATIENT_CLINIC_OR_DEPARTMENT_OTHER): Payer: BC Managed Care – PPO

## 2021-02-08 ENCOUNTER — Emergency Department (HOSPITAL_BASED_OUTPATIENT_CLINIC_OR_DEPARTMENT_OTHER)
Admission: EM | Admit: 2021-02-08 | Discharge: 2021-02-08 | Disposition: A | Discharge status: Home / Self Care | Payer: BC Managed Care – PPO | Attending: Emergency Medicine | Admitting: Emergency Medicine

## 2021-02-08 ENCOUNTER — Other Ambulatory Visit (HOSPITAL_BASED_OUTPATIENT_CLINIC_OR_DEPARTMENT_OTHER): Payer: Self-pay

## 2021-02-08 ENCOUNTER — Encounter (HOSPITAL_BASED_OUTPATIENT_CLINIC_OR_DEPARTMENT_OTHER): Payer: Self-pay | Admitting: Urology

## 2021-02-08 DIAGNOSIS — Z7901 Long term (current) use of anticoagulants: Secondary | ICD-10-CM | POA: Insufficient documentation

## 2021-02-08 DIAGNOSIS — M79662 Pain in left lower leg: Secondary | ICD-10-CM | POA: Diagnosis present

## 2021-02-08 DIAGNOSIS — Z79899 Other long term (current) drug therapy: Secondary | ICD-10-CM | POA: Insufficient documentation

## 2021-02-08 DIAGNOSIS — J45909 Unspecified asthma, uncomplicated: Secondary | ICD-10-CM | POA: Insufficient documentation

## 2021-02-08 DIAGNOSIS — I82451 Acute embolism and thrombosis of right peroneal vein: Secondary | ICD-10-CM

## 2021-02-08 DIAGNOSIS — Z87891 Personal history of nicotine dependence: Secondary | ICD-10-CM | POA: Insufficient documentation

## 2021-02-08 DIAGNOSIS — I10 Essential (primary) hypertension: Secondary | ICD-10-CM | POA: Insufficient documentation

## 2021-02-08 DIAGNOSIS — I82492 Acute embolism and thrombosis of other specified deep vein of left lower extremity: Secondary | ICD-10-CM | POA: Diagnosis not present

## 2021-02-08 MED ORDER — APIXABAN (ELIQUIS) EDUCATION KIT FOR DVT/PE PATIENTS: PACK | Freq: Once | Status: AC

## 2021-02-08 MED ORDER — APIXABAN 2.5 MG PO TABS
10.0000 mg | ORAL_TABLET | Freq: Two times a day (BID) | ORAL | Status: DC
Start: 2021-02-08 — End: 2021-02-08
  Administered 2021-02-08: 10 mg via ORAL
  Filled 2021-02-08: qty 4

## 2021-02-08 MED ORDER — APIXABAN (ELIQUIS) VTE STARTER PACK (10MG AND 5MG): ORAL_TABLET | ORAL | 0 refills | Status: AC

## 2021-02-08 MED ORDER — APIXABAN 2.5 MG PO TABS: 5.0000 mg | ORAL_TABLET | Freq: Two times a day (BID) | ORAL | Status: DC

## 2021-02-08 NOTE — Progress Notes (Signed)
ANTICOAGULATION CONSULT NOTE - Initial Consult  Pharmacy Consult for apixaban Indication: DVT  Allergies  Allergen Reactions   Cephalosporins Hives and Other (See Comments)    Can take Rocephin Other reaction(s): Other (See Comments) hives Can take Rocephin   Penicillins Hives, Other (See Comments) and Swelling    Has patient had a PCN reaction causing immediate rash, facial/tongue/throat swelling, SOB or lightheadedness with hypotension: Yes Has patient had a PCN reaction causing severe rash involving mucus membranes or skin necrosis: No Has patient had a PCN reaction that required hospitalization Yes Has patient had a PCN reaction occurring within the last 10 years: No If all of the above answers are "NO", then may proceed with Cephalosporin use.   Prochlorperazine     Causes psychotic reaction Causes psychotic reaction Causes psychotic reaction   Shellfish Allergy Anaphylaxis   Sulfa Antibiotics Hives, Shortness Of Breath and Anaphylaxis   Sulfasalazine Hives and Shortness Of Breath   Ciprofloxacin Hives   Erythromycin Hives   Fluocinolone Acetonide Hives   Haloperidol     Other reaction(s): Other (see comments) Psychotic reaction    Hydrochlorothiazide Other (See Comments)    Sweaty, faint feeling Other reaction(s): Other (see comments) Sweaty, faint feeling   Metronidazole Other (See Comments)    Causes a greenish purple fuzz on tongue Other reaction(s): Other (see comments) Causes a greenish purple fuzz on tongue   Propofol     Other reaction(s): Other (see comments) Patient had minor skin issues after going home and would like this "allergy" to be taken off her list! "Blood spots on my face"  Patient had minor skin issues after going home and would like this "allergy" to be taken off her list! "Blood spots on my face"     Pseudoephedrine Palpitations and Other (See Comments)    sweats Other reaction(s): Other (see comments), Other (See  Comments) sweats sweats sweats    Ciprocin-Fluocin-Procin [Fluocinolone Acetonide] Hives   Keflex [Cephalexin]    Propofol     Patient had minor skin issues after going home and would like this "allergy" to be taken off her list! "Blood spots on my face"     Patient Measurements: Height: 5\' 6"  (167.6 cm) Weight: 104.8 kg (231 lb) IBW/kg (Calculated) : 59.3  Vital Signs: Temp: 98.2 F (36.8 C) (10/12 0832) Temp Source: Oral (10/12 0832) BP: 122/79 (10/12 0832) Pulse Rate: 91 (10/12 0832)  Labs: No results for input(s): HGB, HCT, PLT, APTT, LABPROT, INR, HEPARINUNFRC, HEPRLOWMOCWT, CREATININE, CKTOTAL, CKMB, TROPONINIHS in the last 72 hours.  Estimated Creatinine Clearance: 122.4 mL/min (by C-G formula based on SCr of 0.78 mg/dL).   Medical History: Past Medical History:  Diagnosis Date   ADHD    Anxiety    Asthma    Bronchitis    CNS disorder    Depression    Drug-seeking behavior    Fatty liver disease, nonalcoholic    Gall stones    Gall stones    Genital warts    History of kidney stones    Hypertension    Increased frequency of headaches    Irritable bowel    Kidney stones    Major depressive disorder, recurrent severe without psychotic features (HCC)    Migraines    Urinary tract infection    Assessment: 35 yo F with leg pain, found to have DVT in isolated peroneal vein, unsure if acute or chronic per read. No AC PTA. CBC wnl.   Plan: New start apixaban 10mg  BID then  5mg  BID  , PharmD, [facility] Medicine Clinical Pharmacist ED RPh Phone: [phone] Main RX: [phone]

## 2021-02-08 NOTE — ED Provider Notes (Signed)
[facility] EMERGENCY DEPARTMENT Provider Note   CSN: [id number] Arrival date & time: 02/08/21  0818     History Chief Complaint  Patient presents with   Leg Pain    Carol [name] is a 35 y.o. female.  35 yo F with a chief complaints of left calf pain.  Pain to the musculature in the back.  Worse with bearing weight and ambulating.  She denies any trauma to the area.  States it started hurting spontaneously a couple days ago.  Feels like after having a cramp but never actually had a cramp or spasm.  The history is provided by the patient.  Leg Pain Location:  Leg Time since incident:  2 days Injury: yes   Leg location:  L lower leg Pain details:    Quality:  Aching   Severity:  Moderate   Onset quality:  Gradual   Duration:  2 days   Timing:  Constant   Progression:  Worsening Chronicity:  New Relieved by:  Nothing Worsened by:  Nothing Ineffective treatments:  None tried Associated symptoms: no fever       Past Medical History:  Diagnosis Date   ADHD    Anxiety    Asthma    Bronchitis    CNS disorder    Depression    Drug-seeking behavior    Fatty liver disease, nonalcoholic    Gall stones    Gall stones    Genital warts    History of kidney stones    Hypertension    Increased frequency of headaches    Irritable bowel    Kidney stones    Major depressive disorder, recurrent severe without psychotic features (HCC)    Migraines    Urinary tract infection     Patient Active Problem List   Diagnosis Date Noted   S/P laparoscopic cholecystectomy 05/21/2018   Biliary colic 05/04/2018   Pelvic pain in female 12/19/2016   Condyloma acuminatum of vulva 10/22/2016   MDD (major depressive disorder), recurrent severe, without psychosis (HCC) 11/01/2013   CNS disorder 07/30/2012   Borderline behavior 05/08/2012   ADHD (attention deficit hyperactivity disorder) 05/08/2012   Insomnia secondary to depression with anxiety 05/08/2012    Past  Surgical History:  Procedure Laterality Date   CERVICAL BIOPSY  W/ LOOP ELECTRODE EXCISION     CHOLECYSTECTOMY N/A 05/04/2018   Procedure: LAPAROSCOPIC CHOLECYSTECTOMY;  Surgeon: Duanne Guess, MD;  Location: [facility] ORS;  Service: General;  Laterality: N/A;   CYSTOSCOPY/URETEROSCOPY/HOLMIUM LASER/STENT PLACEMENT Left 08/01/2017   Procedure: CYSTOSCOPY/URETEROSCOPY/HOLMIUM LASER/STENT PLACEMENT;  Surgeon: Vanna Scotland, MD;  Location: [facility] ORS;  Service: Urology;  Laterality: Left;   NO PAST SURGERIES       OB History     Gravida  0   Para  0   Term  0   Preterm  0   AB  0   Living  0      SAB  0   IAB  0   Ectopic  0   Multiple  0   Live Births  0           Family History  Problem Relation Age of Onset   Physical abuse Mother    Anxiety disorder Mother    ADD / ADHD Mother    Paranoid behavior Mother    Hypertension Mother    Drug abuse Father    Depression Father    Alcohol abuse Maternal Grandfather    ADD / ADHD Maternal  Grandmother    Anxiety disorder Maternal Grandmother    Dementia Maternal Grandmother    OCD Maternal Grandmother    Sexual abuse Maternal Grandmother    Alcohol abuse Paternal Grandfather    Bipolar disorder Cousin    Schizophrenia Neg Hx    Seizures Neg Hx     Social History   Tobacco Use   Smoking status: Former    Packs/day: 0.25    Types: Cigarettes   Smokeless tobacco: Never  Vaping Use   Vaping Use: Some days  Substance Use Topics   Alcohol use: No   Drug use: No    Home Medications Prior to Admission medications   Medication Sig Start Date End Date Taking? Authorizing Provider  APIXABAN Everlene Balls) VTE STARTER PACK (10MG  AND 5MG ) Take as directed on package: start with two-5mg  tablets twice daily for 7 days. On day 8, switch to one-5mg  tablet twice daily. 02/08/21  Yes , DO  ALPRAZolam 04/10/21) 1 MG tablet Take 1 mg by mouth 3 (three) times daily as needed for anxiety.     [provider]   amLODipine (NORVASC) 5 MG tablet Take 1 tablet (5 mg total) by mouth daily. 04/02/20 04/02/21  14/4/21, PA-C  benzonatate (TESSALON) 100 MG capsule Take 1 capsule (100 mg total) by mouth every 8 (eight) hours. 12/04/20   Joni Reining, PA-C  buPROPion (WELLBUTRIN XL) 150 MG 24 hr tablet Take 150 mg by mouth daily at 6 (six) AM.    [provider]  dicyclomine (BENTYL) 10 MG capsule Take 1 capsule (10 mg total) by mouth 3 (three) times daily as needed for up to 14 days (abdominal cramping/pain). 08/27/19 09/10/19  08/29/19, MD  ibuprofen (ADVIL,MOTRIN) 800 MG tablet Take 1 tablet (800 mg total) by mouth every 8 (eight) hours as needed. 05/04/18   Phineas Semen, MD  lidocaine (XYLOCAINE) 2 % solution Use as directed 15 mLs in the mouth or throat as needed for mouth pain. 12/04/20   Duanne Guess, PA-C  ondansetron (ZOFRAN ODT) 4 MG disintegrating tablet Take 1 tablet (4 mg total) by mouth every 8 (eight) hours as needed for nausea or vomiting. 01/28/21   Theron Arista, PA-C  oxyCODONE (OXY IR/ROXICODONE) 5 MG immediate release tablet Take 1 tablet (5 mg total) by mouth every 6 (six) hours as needed for severe pain. 05/04/18   Theron Arista, MD  tiZANidine (ZANAFLEX) 4 MG capsule Take 4 mg by mouth 3 (three) times daily as needed for muscle spasms.     [provider]  traMADol (ULTRAM) 50 MG tablet Take 1 tablet (50 mg total) by mouth every 6 (six) hours as needed. 04/02/20 04/02/21  14/4/21, PA-C  lisinopril (ZESTRIL) 20 MG tablet Take 1 tablet (20 mg total) by mouth daily. 04/02/20 04/02/20  14/4/21, PA-C    Allergies    Cephalosporins, Penicillins, Prochlorperazine, Shellfish allergy, Sulfa antibiotics, Sulfasalazine, Ciprofloxacin, Erythromycin, Fluocinolone acetonide, Haloperidol, Hydrochlorothiazide, Metronidazole, Propofol, Pseudoephedrine, Ciprocin-fluocin-procin [fluocinolone acetonide], Keflex [cephalexin], and Propofol  Review of Systems   Review of  Systems  Constitutional:  Negative for chills and fever.  HENT:  Negative for congestion and rhinorrhea.   Eyes:  Negative for redness and visual disturbance.  Respiratory:  Negative for shortness of breath and wheezing.   Cardiovascular:  Negative for chest pain and palpitations.  Gastrointestinal:  Negative for nausea and vomiting.  Genitourinary:  Negative for dysuria and urgency.  Musculoskeletal:  Negative for arthralgias and myalgias.  Skin:  Negative for pallor and wound.  Neurological:  Negative for dizziness and headaches.   Physical Exam Updated Vital Signs BP 122/79 (BP Location: Right Arm)   Pulse 91   Temp 98.2 F (36.8 C) (Oral)   Resp 18   Ht 5\' 6"  (1.676 m)   Wt 104.8 kg   LMP 02/06/2021 (Exact Date)   SpO2 98%   BMI 37.28 kg/m   Physical Exam Vitals and nursing note reviewed.  Constitutional:      General: She is not in acute distress.    Appearance: She is well-developed. She is not diaphoretic.     Comments: BMI 37  HENT:     Head: Normocephalic and atraumatic.  Eyes:     Pupils: Pupils are equal, round, and reactive to light.  Cardiovascular:     Rate and Rhythm: Normal rate and regular rhythm.     Heart sounds: No murmur heard.   No friction rub. No gallop.  Pulmonary:     Effort: Pulmonary effort is normal.     Breath sounds: No wheezing or rales.  Abdominal:     General: There is no distension.     Palpations: Abdomen is soft.     Tenderness: There is no abdominal tenderness.  Musculoskeletal:        General: No tenderness.     Cervical back: Normal range of motion and neck supple.     Comments: Pain on palpation along the musculature of the posterior aspect of the lower leg.  No obvious erythema no obvious edema.  2+ dorsalis pedis pulse.  Skin:    General: Skin is warm and dry.  Neurological:     Mental Status: She is alert and oriented to person, place, and time.  Psychiatric:        Behavior: Behavior normal.    ED Results /  Procedures / Treatments   Labs (all labs ordered are listed, but only abnormal results are displayed) Labs Reviewed - No data to display  EKG None  Radiology 04/08/2021 Venous Img Lower Unilateral Left  Result Date: 02/08/2021 CLINICAL DATA:  Post COVID, now with left calf pain. Evaluate for DVT. EXAM: LEFT LOWER EXTREMITY VENOUS DOPPLER ULTRASOUND TECHNIQUE: Gray-scale sonography with graded compression, as well as color Doppler and duplex ultrasound were performed to evaluate the lower extremity deep venous systems from the level of the common femoral vein and including the common femoral, femoral, profunda femoral, popliteal and calf veins including the posterior tibial, peroneal and gastrocnemius veins when visible. The superficial great saphenous vein was also interrogated. Spectral Doppler was utilized to evaluate flow at rest and with distal augmentation maneuvers in the common femoral, femoral and popliteal veins. COMPARISON:  None. FINDINGS: Contralateral Common Femoral Vein: Respiratory phasicity is normal and symmetric with the symptomatic side. No evidence of thrombus. Normal compressibility. Common Femoral Vein: No evidence of thrombus. Normal compressibility, respiratory phasicity and response to augmentation. Saphenofemoral Junction: No evidence of thrombus. Normal compressibility and flow on color Doppler imaging. Profunda Femoral Vein: No evidence of thrombus. Normal compressibility and flow on color Doppler imaging. Femoral Vein: No evidence of thrombus. Normal compressibility, respiratory phasicity and response to augmentation. Popliteal Vein: No evidence of thrombus. Normal compressibility, respiratory phasicity and response to augmentation. Calf Veins: There is mixed echogenic occlusive thrombus involving the left peroneal vein (images 34, 35 and 41). The paired posterior tibial veins appear patent where imaged. Superficial Great Saphenous Vein: No evidence of thrombus. Normal  compressibility. Other Findings:  None.  IMPRESSION: Examination is positive for age-indeterminate mixed echogenic occlusive DVT involving the left peroneal vein. In the absence of prior examinations, an acute on chronic process is not excluded. There is no extension of this distal tibial DVT to the more proximal venous system of the left lower extremity. Electronically Signed   By: Simonne Come M.D.   On: 02/08/2021 10:06    Procedures Procedures   Medications Ordered in ED Medications - No data to display  ED Course  I have reviewed the triage vital signs and the nursing notes.  Pertinent labs & imaging results that were available during my care of the patient were reviewed by me and considered in my medical decision making (see chart for details).    MDM Rules/Calculators/A&P                           35 yo F with a chief complaints of left leg pain.  This been going on for couple days.  Most likely the patient has a muscular strain.  She is concerned about a DVT.  No outward signs of DVT though difficult to evaluate due to leg size we will obtain a DVT study.  DVT study with isolated peroneal vein that is difficult to tell if it is acute or not.  With patient having symptoms I will start on anticoagulation.  Have her follow-up with her family doctor.  10:15 AM:  I have discussed the diagnosis/risks/treatment options with the patient and believe the pt to be eligible for discharge home to follow-up with PCP. We also discussed returning to the ED immediately if new or worsening sx occur. We discussed the sx which are most concerning (e.g., sudden worsening pain, fever, inability to tolerate by mouth) that necessitate immediate return. Medications administered to the patient during their visit and any new prescriptions provided to the patient are listed below.  Medications given during this visit Medications - No data to display   The patient appears reasonably screen and/or stabilized for  discharge and I doubt any other medical condition or other [facility] requiring further screening, evaluation, or treatment in the ED at this time prior to discharge.   Final Clinical Impression(s) / ED Diagnoses Final diagnoses:  Acute deep vein thrombosis (DVT) of right peroneal vein (HCC)    Rx / DC Orders ED Discharge Orders          Ordered    APIXABAN (ELIQUIS) VTE STARTER PACK (10MG  AND 5MG )        02/08/21 1014             , DO 02/08/21 1015

## 2021-02-08 NOTE — Discharge Instructions (Addendum)
-------------------------------------------------------------------------------------   Information on my medicine - ELIQUIS (apixaban)  This medication education was reviewed with me or my healthcare representative as part of my discharge preparation.   Why was Eliquis prescribed for you? Eliquis was prescribed to treat blood clots that may have been found in the veins of your legs (deep vein thrombosis) or in your lungs (pulmonary embolism) and to reduce the risk of them occurring again.  What do You need to know about Eliquis ? The starting dose is 10 mg (two 5 mg tablets) taken TWICE daily for the FIRST SEVEN (7) DAYS, then on (enter date)  02/15/2021 AM,  the dose is reduced to ONE 5 mg tablet taken TWICE daily.  Eliquis may be taken with or without food.   Try to take the dose about the same time in the morning and in the evening. If you have difficulty swallowing the tablet whole please discuss with your pharmacist how to take the medication safely.  Take Eliquis exactly as prescribed and DO NOT stop taking Eliquis without talking to the doctor who prescribed the medication.  Stopping may increase your risk of developing a new blood clot.  Refill your prescription before you run out.  After discharge, you should have regular check-up appointments with your healthcare provider that is prescribing your Eliquis.    What do you do if you miss a dose? If a dose of ELIQUIS is not taken at the scheduled time, take it as soon as possible on the same day and twice-daily administration should be resumed. The dose should not be doubled to make up for a missed dose.  Important Safety Information A possible side effect of Eliquis is bleeding. You should call your healthcare provider right away if you experience any of the following: Bleeding from an injury or your nose that does not stop. Unusual colored urine (red or dark brown) or unusual colored stools (red or black). Unusual bruising  for unknown reasons. A serious fall or if you hit your head (even if there is no bleeding).  Some medicines may interact with Eliquis and might increase your risk of bleeding or clotting while on Eliquis. To help avoid this, consult your healthcare provider or pharmacist prior to using any new prescription or non-prescription medications, including herbals, vitamins, non-steroidal anti-inflammatory drugs (NSAIDs) and supplements.  This website has more information on Eliquis (apixaban): http://www.eliquis.com/eliquis/home

## 2021-02-08 NOTE — ED Triage Notes (Signed)
No swelling or redness noted to left lower leg, normal temperature, +2 pedal pulse noted. Point tenderness to posterior calf

## 2021-02-08 NOTE — ED Triage Notes (Signed)
Left calf soreness x 3 days ago,  pt states positive for covid x 11 days ago. States unable to bear full wt on left leg at this time.  Elevating the leg makes it better.  No recent travel, not on birth control at this time.

## 2021-03-25 ENCOUNTER — Other Ambulatory Visit: Payer: Self-pay | Admitting: Physician Assistant

## 2021-06-16 ENCOUNTER — Emergency Department (HOSPITAL_BASED_OUTPATIENT_CLINIC_OR_DEPARTMENT_OTHER)
Admission: EM | Admit: 2021-06-16 | Discharge: 2021-06-16 | Disposition: A | Discharge status: Home / Self Care | Payer: 59 | Attending: Emergency Medicine | Admitting: Emergency Medicine

## 2021-06-16 ENCOUNTER — Encounter (HOSPITAL_BASED_OUTPATIENT_CLINIC_OR_DEPARTMENT_OTHER): Payer: Self-pay | Admitting: Emergency Medicine

## 2021-06-16 ENCOUNTER — Emergency Department (HOSPITAL_BASED_OUTPATIENT_CLINIC_OR_DEPARTMENT_OTHER): Payer: 59

## 2021-06-16 ENCOUNTER — Other Ambulatory Visit: Payer: Self-pay

## 2021-06-16 DIAGNOSIS — J45909 Unspecified asthma, uncomplicated: Secondary | ICD-10-CM | POA: Diagnosis not present

## 2021-06-16 DIAGNOSIS — R0602 Shortness of breath: Secondary | ICD-10-CM | POA: Diagnosis not present

## 2021-06-16 DIAGNOSIS — R0789 Other chest pain: Secondary | ICD-10-CM | POA: Diagnosis present

## 2021-06-16 DIAGNOSIS — M7989 Other specified soft tissue disorders: Secondary | ICD-10-CM | POA: Insufficient documentation

## 2021-06-16 DIAGNOSIS — M79605 Pain in left leg: Secondary | ICD-10-CM | POA: Diagnosis not present

## 2021-06-16 DIAGNOSIS — M79604 Pain in right leg: Secondary | ICD-10-CM | POA: Diagnosis not present

## 2021-06-16 DIAGNOSIS — Z20822 Contact with and (suspected) exposure to covid-19: Secondary | ICD-10-CM | POA: Insufficient documentation

## 2021-06-16 DIAGNOSIS — R072 Precordial pain: Secondary | ICD-10-CM | POA: Diagnosis not present

## 2021-06-16 DIAGNOSIS — Z87891 Personal history of nicotine dependence: Secondary | ICD-10-CM | POA: Insufficient documentation

## 2021-06-16 DIAGNOSIS — Z7901 Long term (current) use of anticoagulants: Secondary | ICD-10-CM | POA: Diagnosis not present

## 2021-06-16 DIAGNOSIS — I10 Essential (primary) hypertension: Secondary | ICD-10-CM | POA: Diagnosis not present

## 2021-06-16 HISTORY — DX: Acute embolism and thrombosis of unspecified vein: I82.90

## 2021-06-16 LAB — COMPREHENSIVE METABOLIC PANEL
ALT: 56 U/L — ABNORMAL HIGH (ref 0–44)
AST: 51 U/L — ABNORMAL HIGH (ref 15–41)
Albumin: 3.8 g/dL (ref 3.5–5.0)
Alkaline Phosphatase: 57 U/L (ref 38–126)
Anion gap: 7 (ref 5–15)
BUN: 9 mg/dL (ref 6–20)
CO2: 24 mmol/L (ref 22–32)
Calcium: 8.6 mg/dL — ABNORMAL LOW (ref 8.9–10.3)
Chloride: 104 mmol/L (ref 98–111)
Creatinine, Ser: 0.69 mg/dL (ref 0.44–1.00)
GFR, Estimated: >60 mL/min (ref >60)
Glucose, Bld: 151 mg/dL — ABNORMAL HIGH (ref 70–99)
Potassium: 4.1 mmol/L (ref 3.5–5.1)
Sodium: 135 mmol/L (ref 135–145)
Total Bilirubin: 0.3 mg/dL (ref 0.3–1.2)
Total Protein: 7.5 g/dL (ref 6.5–8.1)

## 2021-06-16 LAB — CBC WITH DIFFERENTIAL/PLATELET
Abs Immature Granulocytes: 0.03 10*3/uL (ref 0.00–0.07)
Basophils Absolute: 0 10*3/uL (ref 0.0–0.1)
Basophils Relative: 0 %
Eosinophils Absolute: 0.2 10*3/uL (ref 0.0–0.5)
Eosinophils Relative: 3 %
HCT: 43 % (ref 36.0–46.0)
Hemoglobin: 14.6 g/dL (ref 12.0–15.0)
Immature Granulocytes: 0 %
Lymphocytes Relative: 31 %
Lymphs Abs: 2.1 10*3/uL (ref 0.7–4.0)
MCH: 30.1 pg (ref 26.0–34.0)
MCHC: 34 g/dL (ref 30.0–36.0)
MCV: 88.7 fL (ref 80.0–100.0)
Monocytes Absolute: 0.7 10*3/uL (ref 0.1–1.0)
Monocytes Relative: 11 %
Neutro Abs: 3.7 10*3/uL (ref 1.7–7.7)
Neutrophils Relative %: 55 %
Platelets: 287 10*3/uL (ref 150–400)
RBC: 4.85 MIL/uL (ref 3.87–5.11)
RDW: 12.1 % (ref 11.5–15.5)
WBC: 6.8 10*3/uL (ref 4.0–10.5)
nRBC: 0 % (ref 0.0–0.2)

## 2021-06-16 LAB — RESP PANEL BY RT-PCR (FLU A&B, COVID) ARPGX2
Influenza A by PCR: NEGATIVE
Influenza B by PCR: NEGATIVE
SARS Coronavirus 2 by RT PCR: NEGATIVE

## 2021-06-16 LAB — BRAIN NATRIURETIC PEPTIDE: B Natriuretic Peptide: 16.4 pg/mL (ref 0.0–100.0)

## 2021-06-16 LAB — TROPONIN I (HIGH SENSITIVITY): Troponin I (High Sensitivity): 2 ng/L (ref <18)

## 2021-06-16 LAB — HCG, SERUM, QUALITATIVE: Preg, Serum: NEGATIVE

## 2021-06-16 LAB — LIPASE, BLOOD: Lipase: 32 U/L (ref 11–51)

## 2021-06-16 MED ORDER — IOHEXOL 350 MG/ML SOLN
100.0000 mL | Freq: Once | INTRAVENOUS | Status: AC | PRN
Start: 2021-06-16 — End: 2021-06-16
  Administered 2021-06-16: 100 mL via INTRAVENOUS

## 2021-06-16 NOTE — ED Provider Notes (Signed)
Emergency Department Provider Note   I have reviewed the triage vital signs and the nursing notes.   HISTORY  Chief Complaint Leg Pain and Chest Pain   HPI Carol [name] is a 36 y.o. 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  She states this feels like when she had her blood clot in October.  She is also developed some shortness of breath and intermittent chest tightness over the past 2 days.  No fever or chills.  No upper respiratory infection symptoms.  No cough.  No vomiting or diarrhea.  No bleeding issues.   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 ENT: No sore throat. Cardiovascular: Positive chest pain. Respiratory: Positive shortness of breath. Gastrointestinal: No abdominal pain.  No nausea, no vomiting.  No diarrhea.  No constipation. Genitourinary: Negative for dysuria. Musculoskeletal: Negative for back pain. Positive bilateral LE swelling and pain.  Skin: Negative for rash. Neurological: Negative for headaches, focal weakness or numbness.   ____________________________________________   PHYSICAL EXAM:  VITAL SIGNS: Vitals:   06/16/21 1127 06/16/21 1200  BP:  114/82  Pulse: 81 84  Resp:  16  SpO2: 98% 99%    Constitutional: Alert and oriented. Well appearing and in  no acute distress. Eyes: Conjunctivae are normal.  Head: Atraumatic. Nose: No congestion/rhinnorhea. Mouth/Throat: Mucous membranes are moist.  Neck: No stridor.  Cardiovascular: Normal rate, regular rhythm. Good peripheral circulation. Grossly normal heart sounds.   Respiratory: Normal respiratory effort.  No retractions. Lungs CTAB. Gastrointestinal: Soft and nontender. No distention.  Musculoskeletal: No lower extremity tenderness with trace pitting edema. No gross deformities of extremities. Neurologic:  Normal speech and language. No gross focal neurologic deficits are appreciated.  Skin:  Skin is warm, dry and intact. No rash noted.   ____________________________________________   LABS (all labs ordered are listed, but only abnormal results are displayed)  Labs Reviewed  COMPREHENSIVE METABOLIC PANEL - Abnormal; Notable for the following components:      Result Value   Glucose, Bld 151 (*)    Calcium 8.6 (*)    AST 51 (*)    ALT 56 (*)    All other components within normal limits  RESP PANEL BY RT-PCR (FLU A&B, COVID) ARPGX2  LIPASE, BLOOD  CBC WITH DIFFERENTIAL/PLATELET  HCG, SERUM, QUALITATIVE  BRAIN NATRIURETIC PEPTIDE  TROPONIN I (HIGH SENSITIVITY)   ____________________________________________  EKG   EKG Interpretation  Date/Time:  Friday June 16 2021 09:31:24 EST Ventricular Rate:  87 PR Interval:  148 QRS Duration: 101 QT Interval:  368 QTC Calculation: 443 R Axis:   30 Text Interpretation: Sinus rhythm Confirmed by Nanda Quinton [phone]) on 06/16/2021  9:37:12 AM        ____________________________________________   PROCEDURES  Procedure(s) performed:   Procedures  None ____________________________________________   INITIAL IMPRESSION / ASSESSMENT AND PLAN / ED COURSE  Pertinent labs & imaging results that were available during my care of the patient were reviewed by me and considered in my medical decision making (see chart for  details).   This patient is Presenting for Evaluation of CP, which does require a range of treatment options, and is a complaint that involves a high risk of morbidity and mortality.  The Differential Diagnoses includes all life-threatening causes for chest pain. This includes but is not exclusive to acute coronary syndrome, aortic dissection, pulmonary embolism, cardiac tamponade, community-acquired pneumonia, pericarditis, musculoskeletal chest wall pain, etc.   I decided to review pertinent External Data, and in summary patient seen in October with LE DVT and started on Eliquis.   Clinical Laboratory Tests Ordered, included COVID and Flu negative. BNP and Troponin WNL. Pregnancy negative.   Radiologic Tests Ordered, included CTA and DVT US. I independently interpreted the images and agree with radiology interpretation.   Cardiac Monitor Tracing which shows NSR.   Social Determinants of Health Risk patient is a former smoker.   Medical Decision Making: Summary:  Patient presents emergency department with leg pain and swelling bilaterally with chest discomfort and shortness of breath.  She does not appear acutely volume overloaded or to be in decompensated CHF.  ACS is a consideration although lower suspicion given age although does have several risk factors.  PE is a consideration.  She has known prior history of DVT and although has been compliant with her Eliquis feels that her symptoms in the legs have returned and has since developed chest pain and shortness of breath.  Given her elevated risk for PE I do not feel D-dimer would be adequate to risk stratify her and have moved for CTA of the chest along with repeat DVT ultrasounds bilaterally.  Reevaluation with update and discussion with patient. Imaging is reassuring. Plan to continue home medication and f/u with PCP. Discussed ED return precautions.   Disposition: discharge  ____________________________________________  FINAL  CLINICAL IMPRESSION(S) / ED DIAGNOSES  Final diagnoses:  Precordial chest pain  Leg pain, bilateral    Note:  This document was prepared using Dragon voice recognition software and may include unintentional dictation errors.  Nanda Quinton, MD, [facility] Emergency Medicine    Kaimana Lurz, [facility], MD 06/17/21 [phone]

## 2021-06-16 NOTE — ED Triage Notes (Signed)
Pt has history of blood clot in right leg. Today feels similar discomfort in left lower leg and has had chest discomfort for 3 days. Pt stated last night felt her left calf tighten up and believes she can feel the location of a clot. This morning has also been feeling SOB.

## 2021-06-16 NOTE — ED Notes (Signed)
Dc instructions reviewed with pt no questions or concerns at this time. Will follow up with pcp 

## 2021-06-16 NOTE — Discharge Instructions (Signed)
You were seen in the emergency room today with chest discomfort and leg pain.  Your ultrasound did not show any blood clots and your CT scan of the chest also did not show any blood clots in the lungs.  Please either follow-up with your existing primary care doctor or I have listed the name of the practice on this form to call and schedule a new patient appointment.  If you develop any new or suddenly worsening symptoms please return to the emergency department.

## 2021-06-23 ENCOUNTER — Encounter (INDEPENDENT_AMBULATORY_CARE_PROVIDER_SITE_OTHER): Payer: Self-pay | Admitting: Nurse Practitioner

## 2022-02-26 ENCOUNTER — Encounter (INDEPENDENT_AMBULATORY_CARE_PROVIDER_SITE_OTHER): Payer: Self-pay
